# Patient Record
Sex: Male | Born: 1945
Health system: Southern US, Community
[De-identification: ages and names within clinical notes are randomized; demographics above are authoritative.]

## PROBLEM LIST (undated history)

## (undated) DIAGNOSIS — I4891 Unspecified atrial fibrillation: Secondary | ICD-10-CM

## (undated) DIAGNOSIS — I10 Essential (primary) hypertension: Secondary | ICD-10-CM

## (undated) DIAGNOSIS — I499 Cardiac arrhythmia, unspecified: Secondary | ICD-10-CM

## (undated) DIAGNOSIS — I340 Nonrheumatic mitral (valve) insufficiency: Secondary | ICD-10-CM

## (undated) DIAGNOSIS — K219 Gastro-esophageal reflux disease without esophagitis: Secondary | ICD-10-CM

## (undated) DIAGNOSIS — G7000 Myasthenia gravis without (acute) exacerbation: Secondary | ICD-10-CM

## (undated) DIAGNOSIS — E782 Mixed hyperlipidemia: Secondary | ICD-10-CM

## (undated) DIAGNOSIS — Z8679 Personal history of other diseases of the circulatory system: Secondary | ICD-10-CM

## (undated) DIAGNOSIS — Z8669 Personal history of other diseases of the nervous system and sense organs: Secondary | ICD-10-CM

## (undated) DIAGNOSIS — I77810 Thoracic aortic ectasia: Secondary | ICD-10-CM

## (undated) HISTORY — DX: Personal history of other diseases of the nervous system and sense organs: Z86.69

## (undated) HISTORY — DX: Gastro-esophageal reflux disease without esophagitis: K21.9

## (undated) HISTORY — DX: Personal history of other diseases of the circulatory system: Z86.79

## (undated) HISTORY — DX: Nonrheumatic mitral (valve) insufficiency: I34.0

## (undated) HISTORY — DX: Mixed hyperlipidemia: E78.2

## (undated) HISTORY — DX: Thoracic aortic ectasia: I77.810

## (undated) HISTORY — DX: Essential (primary) hypertension: I10

## (undated) HISTORY — DX: Myasthenia gravis without (acute) exacerbation: G70.00

---
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
## 1989-02-12 HISTORY — PX: LAMINECTOMY: SHX219

## 1998-02-18 ENCOUNTER — Ambulatory Visit (HOSPITAL_COMMUNITY): Admission: RE | Admit: 1998-02-18 | Discharge: 1998-02-18 | Payer: Self-pay | Admitting: Gastroenterology

## 1998-03-30 ENCOUNTER — Ambulatory Visit (HOSPITAL_COMMUNITY): Admission: RE | Admit: 1998-03-30 | Discharge: 1998-03-30 | Payer: Self-pay | Admitting: *Deleted

## 1998-03-30 ENCOUNTER — Encounter: Payer: Self-pay | Admitting: *Deleted

## 2000-12-09 ENCOUNTER — Encounter: Payer: Self-pay | Admitting: Emergency Medicine

## 2000-12-09 ENCOUNTER — Inpatient Hospital Stay (HOSPITAL_COMMUNITY): Admission: EM | Admit: 2000-12-09 | Discharge: 2000-12-10 | Payer: Self-pay | Admitting: Emergency Medicine

## 2002-04-05 ENCOUNTER — Encounter: Payer: Self-pay | Admitting: Emergency Medicine

## 2002-04-06 ENCOUNTER — Observation Stay (HOSPITAL_COMMUNITY): Admission: EM | Admit: 2002-04-06 | Discharge: 2002-04-06 | Payer: Self-pay | Admitting: Emergency Medicine

## 2002-04-06 ENCOUNTER — Encounter: Payer: Self-pay | Admitting: Emergency Medicine

## 2002-11-25 ENCOUNTER — Encounter: Payer: Self-pay | Admitting: Family Medicine

## 2002-11-25 ENCOUNTER — Ambulatory Visit (HOSPITAL_COMMUNITY): Admission: RE | Admit: 2002-11-25 | Discharge: 2002-11-25 | Payer: Self-pay | Admitting: Family Medicine

## 2004-04-29 ENCOUNTER — Ambulatory Visit (HOSPITAL_COMMUNITY): Admission: RE | Admit: 2004-04-29 | Discharge: 2004-04-29 | Payer: Self-pay | Admitting: Family Medicine

## 2004-12-05 ENCOUNTER — Encounter: Admission: RE | Admit: 2004-12-05 | Discharge: 2004-12-05 | Payer: Self-pay | Admitting: *Deleted

## 2005-12-10 ENCOUNTER — Encounter: Admission: RE | Admit: 2005-12-10 | Discharge: 2005-12-10 | Payer: Self-pay | Admitting: *Deleted

## 2006-09-19 ENCOUNTER — Ambulatory Visit (HOSPITAL_BASED_OUTPATIENT_CLINIC_OR_DEPARTMENT_OTHER): Admission: RE | Admit: 2006-09-19 | Discharge: 2006-09-19 | Payer: Self-pay | Admitting: Surgery

## 2006-09-19 ENCOUNTER — Encounter (INDEPENDENT_AMBULATORY_CARE_PROVIDER_SITE_OTHER): Payer: Self-pay | Admitting: Surgery

## 2007-07-06 ENCOUNTER — Ambulatory Visit: Payer: Self-pay | Admitting: Internal Medicine

## 2007-07-06 ENCOUNTER — Inpatient Hospital Stay (HOSPITAL_COMMUNITY): Admission: EM | Admit: 2007-07-06 | Discharge: 2007-07-08 | Payer: Self-pay | Admitting: Emergency Medicine

## 2009-04-25 ENCOUNTER — Encounter: Admission: RE | Admit: 2009-04-25 | Discharge: 2009-04-25 | Payer: Self-pay | Admitting: Family Medicine

## 2010-03-04 ENCOUNTER — Encounter: Payer: Self-pay | Admitting: *Deleted

## 2010-06-27 NOTE — Discharge Summary (Signed)
[name], [name]           ACCOUNT NO.:  [id number]   MEDICAL RECORD NO.:  [id number]          PATIENT TYPE:  INP   LOCATION:  1409                         FACILITY:  [facility]   PHYSICIAN:  Ramiro Harvest, MD    DATE OF BIRTH:  Sep 25, 1945   DATE OF ADMISSION:  07/06/2007  DATE OF DISCHARGE:  07/08/2007                               DISCHARGE SUMMARY   PRIMARY CARE PHYSICIAN:  Dr. [name] of [facility].   DISCHARGE DIAGNOSES:  1. Urinary tract infection/early urosepsis.  2. Micturition syncope.  3. Microhematuria secondary to #1.  4. Hypotension.  5. Dehydration.  6. Hypokalemia.  7. Hypertension.  8. Myasthenia gravis.   DISCHARGE MEDICATIONS:  1. Ceftin 250 mg p.o. b.i.d. x12 days.  2. Mycophenolate 1000 mg p.o. b.i.d.  3. Triamterene/hydrochlorothiazide 37.5/25 mg p.o. daily.  4. Pyridostigmine 30 mg p.o. b.i.d.  5. Ramipril 5 mg p.o. daily.  6. Multivitamin daily.  7. Aspirin 81 mg p.o. daily   DISPOSITION:  On followup, patient will be discharged home to follow up  with her primary care physician in 2 weeks.  On followup, a repeat  urinalysis needs to be checked for resolution of UTI.  Patient's urine  sensitivities from this hospitalization needs to be followed up.  A  basic metabolic profile needs to be checked to follow up on the  patient's electrolytes as well.   PROCEDURES PERFORMED:  None.   CONSULTATIONS DONE:  None.   BRIEF ADMISSION HISTORY AND PHYSICAL:  [name] is a 65 year old  male who woke up on the day prior to admission with frequent burning on  urination.  He had to go to the restroom every 10 minutes.  Patient did  not feel well.  He went to the [facility] around lunch time and  was prescribed some Cipro.  He took the first dose at 2 and the second  one at 10:00 p.m.  At around 11:00 p.m., he urinated with large amounts  of blood clots.  He had burning at the end of urination and passed out  in the bathroom.  He went  down on the floor but did not hit his head.  There was no chest pain.  He felt sweaty and clammy.   PHYSICAL EXAM:  Per admitting physician, temperature 98.2, blood  pressure 111/72, respiratory rate 20, satting 94%.  GENERAL:  Patient in no acute distress.  HEENT:  Normocephalic, atraumatic.  Pupils equal, round, and reactive to  light.  Extraocular movements intact.  Moist mucosa membranes.  RESPIRATORY:  Lungs were clear to auscultation bilaterally.  No wheezes.  No rhonchi.  CARDIOVASCULAR:  Regular rate and rhythm.  No murmurs, rubs, or gallops.  ABDOMEN:  Soft, nontender, nondistended.  Positive bowel sounds.  No  rebound.  No guarding.  EXTREMITIES:  No clubbing, cyanosis, or edema.  NEUROLOGICAL:  Patient was alert and oriented x3.  Cranial nerves II-XII  were grossly intact.  No focal deficits.  Muscle strength was within  normal limits.   ADMISSION LABS:  Sodium 135, potassium 3.1, creatinine 0.75, BUN 13.  UA  with 7010 white  blood cells, too numerous to count RBCs, white count  25.2, hemoglobin 15.1, platelets 173, myoglobin 92.4, CK-MB 1.4,  troponin 0.1, INR 1.1.  EKG with normal sinus rhythm.  No acute changes.   HOSPITAL COURSE:  1. UTI/early urosepsis.  Patient was placed on supportive care with IV      fluids.  Patient was placed on IV Rocephin and urine cultures were      reordered.  Cultures came back negative; however, patient had been      on Cipro prior to admission.  [facility] was called to obtain prior      urinalysis and cultures and sensitivities from the urinalysis      obtained at the urgent care.  Sensitivities were pending; however,      urine cultures grew greater than 100,000 E. coli.  Patient remained      stable throughout the hospitalization, remained afebrile.      Patient's white count improved daily and by day of discharge      patient's white count was 6.8.  Patient will be discharged in      stable and improved condition.  Patient was well  hydrated      throughout the hospitalization and his antihypertensives were held      during the hospitalization.  Patient will be discharged home on      Ceftin 250 mg p.o. b.i.d. for 10 days to complete a 14-day course      as patient is being treated as a complicated urinary tract      infection.  Patient will need followup with his PCP and patient      will be discharged in stable and improved condition.  2. Micturition syncope.  Patient had no episodes of syncope throughout      the hospitalization.  It was felt this was secondary to      micturition.  Cardiac enzymes were also obtained which came back      negative.  EKG was within normal limits.  Patient remained      asymptomatic throughout the hospitalization and will be discharged      in stable and improved condition.  3. Hypotension.  Patient's antihypertensives were held throughout the      hospitalization.  Patient was hydrated with IV fluids and by day of      discharge patient's blood pressure was stable at 124/78.  4. Microhematuria secondary to problem #1, was stable.  See treatment      for problem #1.  No more episodes of microhematuria throughout the      hospitalization.  5. Hypokalemia was repleted throughout the hospitalization.  6. Dehydration.  Patient was placed on IV fluids.  Patient was well-      hydrated.  Patient was tolerating p.o.'s and by day of discharge      patient was in stable and improved condition.  Patient's      hypertensive were held throughout the hospitalization.  Patient      will be discharged in stable and improved condition.   On day of discharge, vital signs temperature 99.0, pulse of 64, blood  pressure 124/78, respiratory rate 16, satting 99% on room air.   DISCHARGE LABS:  Sodium 141, potassium 3.8, chloride 109, bicarb 26, BUN  9, creatinine 0.87, glucose of 95, calcium of 8.3.  CBC, white count  6.8, hemoglobin 12.2, platelets 137, hematocrit 34.2.  Urine cultures  from Jul 05, 2007, from [facility] showed greater than 100,000 E.  coli.  It  was a pleasure taking care of [name].      Ramiro Harvest, MD  Electronically Signed     DT/MEDQ  D:  07/08/2007  T:  07/08/2007  Job:  [id number]   cc:   Dario Guardian, M.D.  Fax: [phone]

## 2010-06-27 NOTE — H&P (Signed)
NAMEPRICE, LACHAPELLE           ACCOUNT NO.:  [id number]   MEDICAL RECORD NO.:  [id number]          PATIENT TYPE:  EMS   LOCATION:  ED                           FACILITY:  [facility]   PHYSICIAN:  Georgina Quint. Plotnikov, MDDATE OF BIRTH:  August 09, 1945   DATE OF ADMISSION:  07/06/2007  DATE OF DISCHARGE:                              HISTORY & PHYSICAL   CHIEF COMPLAINT:  Blood in urine.   HISTORY OF PRESENT ILLNESS:  The patient is a 65 year old male who woke  up yesterday with frequent burning urination.  He had to go every 10  minutes. He did not feel well, went to [facility] around lunch  time and was prescribed Cipro.  He took the first dose at 2:00 and the  second at 10:00 p.m.  At around 11:00 he urinated with large amounts of  blood with clots. He had burning at the end of urination and passed out  in the bathroom.  He went down on the floor but did not hit his head.  There was no chest pain.  He felt sweaty and clammy.   PAST MEDICAL HISTORY:  Hypertension, myasthenia gravis.   SOCIAL HISTORY:  He is a Geophysicist/field seismologist. Does not smoke or drink  alcohol.  He is married.   FAMILY HISTORY:  Negative for coronary disease.  Father had some kind of  tachycardia.   ALLERGIES:  DEMEROL.   CURRENT MEDICATIONS:  Triamterene/HCTZ 37.5/25 one daily, mycophenolate  sodium 1000 mg twice a day, pyridostigmine  bromide 60 mg b.i.d.  Ramipril 5 mg daily, Cipro 500 mg twice a day given today.   REVIEW OF SYSTEMS:  No previous syncope.  No chest pain or shortness of  breath.  No arrhythmias. No previous urinary problems.  No kidney  stones. No nausea or vomiting today.  He was able to eat and drink. The  rest of the 18-point review of systems is as above or negative. He has  had some chronic low back pain from arthritis.   PHYSICAL EXAMINATION:  VITAL SIGNS:  Temperature 98.2, blood pressure  111/72, heart rate 20, sats 94%.  He is in no acute distress.  ABDOMEN:  Soft, nontender.  No organomegaly and no mass felt.  HEENT: Moist mucosa.  NECK: Supple.  No thyromegaly or tenderness.  LUNGS:  Clear.  No wheezes or rales.  HEART: S1, S2.  No gallop.  ABDOMEN: Soft, nontender.  No organomegaly. No mass felt.  EXTREMITIES:  Lower extremities without edema.  Calves nontender.  Pulses normal.  NEUROLOGICAL:  He is alert, oriented, cooperative and denies being  depressed.  Cranial Nerves II-XII nonfocal.  Muscle strength with normal  limits.   LABS:  Sodium 135, potassium 3.1, creatinine 0.75, BUN 13.  Urinalysis  with 7-10 WBCs and too numerous to count RBCs.  White count 25,200,  hemoglobin 15.1, platelets 173. Myoglobin 92.4. CK-MB 1.4, troponin 0.1.  INR 1.1.  EKG with normal sinus rhythm, no acute changes.   ASSESSMENT AND PLAN:  1. Urinary tract infection, possible early urosepsis.  We will start      IV Rocephin. Cultures obtained.  2. Macrohematuria due to problem #1.  3. Syncope, likely micturitional.  Admit to telemetry.  Cardiac      markers every 8 hours x3. EKG if needed. Asymptomatic now.  4. Hypotension.  Hold his antihypertensive.  5. Elevated white blood cell count due to problem urinary tract      infection, possible early urosepsis.  6. Hypokalemia.  Will replace p.o.  7. Dehydration.  IV fluids.      Georgina Quint. Plotnikov, MD  Electronically Signed     AVP/MEDQ  D:  07/06/2007  T:  07/06/2007  Job:  [id number]   cc:   Dario Guardian, M.D.  Fax: [phone]

## 2010-06-27 NOTE — Op Note (Signed)
NAMETHEOPHILUS, WALZ           ACCOUNT NO.:  [id number]   MEDICAL RECORD NO.:  [id number]          PATIENT TYPE:  AMB   LOCATION:  DSC                          FACILITY:  [facility]   PHYSICIAN:  Velora Heckler, MD      DATE OF BIRTH:  Sep 06, 1945   DATE OF PROCEDURE:  09/19/2006  DATE OF DISCHARGE:                               OPERATIVE REPORT   PREOPERATIVE DIAGNOSIS:  1. Soft tissue mass left upper extremity, 1.5 cm.  2. Atypical nevus mid back 3 mm.   POSTOPERATIVE DIAGNOSES:  1. Soft tissue mass left upper extremity, 1.5 cm.  2. Atypical nevus mid back 3 mm.   PROCEDURE:  1. Excise 1.5 cm subcutaneous nodule left upper extremity.  2. Shave biopsy, nevus of back (3 mm).   SURGEON:  Velora Heckler, MD, FACS   ANESTHESIA:  Local with epinephrine.   ESTIMATED BLOOD LOSS:  Minimal.   PREPARATION:  Betadine.   COMPLICATIONS:  None.   INDICATIONS:  The patient is a 65 year old white male, [employer] from [place], [place].  The patient has a newly  identified soft tissue mass in the left upper arm as well as an atypical  nevus in the mid back.  He desires excision of both.   PROCEDURE:  The procedure was performed in the minor procedure room at  [facility].  The patient was placed in supine position.  Left upper arm was prepped and draped in the usual strict aseptic  fashion.  Skin was anesthetized with local anesthetic with epinephrine.  An elliptical incision was made so as to encompass the entire soft  tissue mass lesion in the lateral left upper arm.  Dissection was  carried through subcutaneous tissues.  Mass was excised in its entirety  and submitted to pathology for review.  Hemostasis is by direct  pressure.  Skin edges were reapproximated interrupted 3-0 Vicryl  subcuticular sutures.  Wound is washed and dried and Benzoin, Steri-  Strips are applied.  Sterile dressing is applied.   Next, the patient is placed in a seated position.   Skin at the site of  the nevus in the left mid back was prepped with Betadine.  Skin was  anesthetized with local anesthetic with epinephrine.  Using a #15 blade,  a shave biopsy of a 3  mm nevus was performed.  Hemostasis was obtained with silver nitrate.  Neosporin and Band-Aid were placed as dressing.   The patient is discharged home.  He tolerated the procedure well.      Velora Heckler, MD  Electronically Signed     TMG/MEDQ  D:  09/19/2006  T:  09/19/2006  Job:  [id number]   cc:   Dario Guardian, M.D.

## 2010-06-30 NOTE — Cardiovascular Report (Signed)
[place]  Patient:    [name], [name] Visit Number: [id number] MRN: [id number]          Service Type: MED Location: [id number] Attending Physician:  Lyn Records. Iii Dictated by:   Darci Needle, M.D. Proc. Date: 12/10/00 Admit Date:  12/09/2000 Discharge Date: 12/10/2000   CC:         Meredith Staggers, M.D., [facility]  Room #349 @ [facility]   Cardiac Catheterization  INDICATION FOR PROCEDURE: Prolonged chest pain responsive to nitroglycerin and consistent with unstable angina.  PROCEDURES PERFORMED: 1. Left heart catheterization. 2. Selective coronary angiography. 3. Left ventriculography.  DESCRIPTION OF PROCEDURE: After informed consent, a 6 French sheath was inserted into the right femoral artery using a modified Seldinger technique. A French A2 multipurpose catheter was used for hemodynamic recordings, left ventriculography, and selective right and left coronary angiography.  The patient tolerated the procedure without complications.  RESULTS:  I: HEMODYNAMIC DATA:     a. The aortic pressure 126/84 mmHg.     b. Left ventricular pressure 127/12 mmHg.  II: LEFT VENTRICULOGRAPHY: The left ventricle cavity size and demonstrates normal overall contractility.  III: SELECTIVE CORONARY ANGIOGRAPHY:     a. Left main: The left main is short. No significant obstruction is noted.     b. Left anterior descending coronary artery: The LAD is a large        vessel that reaches the left ventricular apex.  It becomes very        small in caliber in the apical course of the vessel. No obstruction        is noted in this vessel. One small diagonal branch arises from this        vessel. Luminal irregularity is noted in the proximal vessel.     c. Circumflex artery: The circumflex artery is large. It gives origin to        three obtuse marginal branches and the PDA. This vessel contains        luminal  irregularities, is quite large and contains no significant        obstruction.  The first obtuse marginal is large and a branching vessel        without any significant abnormalities noted.     d. Right coronary artery: The right coronary artery is small and        nondominant. It contains no significant obstruction.  CONCLUSIONS: 1. Essentially normal coronary arteries for age. There is luminal    irregularity noted in the left anterior descending and in the    circumflex. 2. Normal left ventricular function. 3. Chest pain, etiology is uncertain. With nitroglycerin responsiveness,    normal enzymes, and normal ECGs, the source of the discomfort is almost    certainly not cardiac. Would raise the possibility of gastrointestinal    origin or musculoskeletal. Certainly, should the patient redevelop chest    discomfort, we might also consider CT scanning to rule out aortic    dissection.  PLAN: 1. Discontinue nitroglycerin and heparin. 2. Ambulate. 3. If groin is stable, the patient is eligible for discharge, today    December 10, 2000. Dictated by:   Darci Needle, M.D. Attending Physician:  Lyn Records. Iii DD:  12/10/00 TD:  12/11/00 Job: [id number]

## 2010-06-30 NOTE — H&P (Signed)
NAME:  [name], [name]                  ACCOUNT NO.:  [id number]   MEDICAL RECORD NO.:  [id number]                   PATIENT TYPE:  INP   LOCATION:  1826                                 FACILITY:  [facility]   PHYSICIAN:  Candyce Churn, M.D.          DATE OF BIRTH:  Sep 18, 1945   DATE OF ADMISSION:  04/05/2002  DATE OF DISCHARGE:                                HISTORY & PHYSICAL   CHIEF COMPLAINT:  Chest pain.   HISTORY OF PRESENT ILLNESS:  The patient is a pleasant 65 year old male with  a history of hypertension and obesity.  He presents with chest pain that  started approximately 9 p.m. on April 05, 2002.  It was substernal and  somewhat pleuritic.  It occurred about one and a half to two hours after  eating.  He apparently had some mild chest symptoms -- like a cold -- for  one to two weeks and was given an Advair inhaler at [facility]  approximately 10 days ago.  It was felt that he might have some mild asthma.  The pain tonight in his chest has persisted but did respond partially to  sublingual nitroglycerin x2.  Denies radiation, diaphoresis or nausea or  shortness of breath.  It is a dull ache, not burning.  Initial CPK is  negative.  He is admitted now to rule out MI.   MEDICATIONS:  1. Altace 5 mg a day.  2. Maxzide 37.5/25 mg daily.   PAST MEDICAL HISTORY:  1. Hypertension.  2. Obesity.   PAST SURGICAL HISTORY:  Laminectomy, L5-S1.   FAMILY HISTORY:  Mother died of a Klatskin's tumor in her 75s or 64s.  Father died of old at age 24.  He has a younger brother who is healthy as  far as he knows.   REVIEW OF SYSTEMS:  He had a heart catheterization two years ago by Dr.  Lyn Records; it was normal.  This pain is somewhat different.  He was  told that he likely had GERD with esophageal spasm at that time.  He denies  cough, fever or chills currently.   PHYSICAL EXAMINATION:  GENERAL:  Obese, now in no acute distress, lying  supine.  VITAL  SIGNS:  Blood pressure 121/61 after two nitroglycerin, pulse 84 and  regular, respiratory rate 16 and easy, O2 saturation 94% on room air.  He is  now on 2 L of O2.  HEENT:  He is man balding with glasses.  NECK:  Neck is supple without JVD.  CHEST:  Chest is clear to auscultation.  CARDIAC:  Regular rhythm without murmur, rub or gallop.  ABDOMEN:  Abdomen is soft and nontender with no masses, no pain to  palpitation and no obvious organomegaly.  EXTREMITIES:  Extremities without cyanosis, clubbing or edema.  No pain to  palpation in the calves or thighs.  NEUROLOGICAL:  Oriented x 3, nonfocal.  SKIN:  Skin without rashes.   LABORATORY  AND ACCESSORY CLINICAL DATA:  EKG:  Sinus rhythm at 84 beats per  minute, normal EKG.  There is a question of J point elevation in V2 to V3.   Chest x-ray:  No active disease.   Chest CT significant for a 4-cm pretracheal lymph node, otherwise, benign.   CPK is 320, CK-MB is 1.8, troponin I of 0.02.  Sodium is 137, potassium 2.3,  chloride 103, bicarb 22, BUN 18, creatinine 0.8.  Glucose is 104.  Hemoglobin 15, white count 14,600, 81% polys, platelet count 190,000.   ASSESSMENT:  [age]-year-old male with history of hypertension and  obesity with unclear etiology of chest pain.  No response to  gastrointestinal cocktail but partial response to sublingual nitroglycerin.  He has a 4-cm pretracheal lymph node and mild leukocytosis with 81%  leukocytes; I wonder if this might be a low-grade viral pleurisy or  pneumonitis; I also wonder if Advair may have partially increased his white  count secondary to steroid therapy.   PLAN:  Will admit and monitor overnight.  Treat with subcu Lovenox 1 mg/kg  q.12h.  We will check serial CPKs and place on nasal cannula O2.   We will check EKG at 8 a.m.  If CPKs are negative x3, he could likely be  further worked up as an outpatient and discharged on aspirin and sublingual  nitroglycerin.  We will also give a  PPI and might consider Toradol if pain  persists and is pleuritic and without elevation in his CPK after three q.8h.  CPK checks are not indicative of myocardial ischemia or injury.                                               Candyce Churn, M.D.    RNG/MEDQ  D:  04/06/2002  T:  04/06/2002  Job:  [id number]   cc:   Lazaro Arms, M.D.  [address] [place], [place] [address]  Fax: [phone]   Lyn Records III, M.D.  [address]  [place]  [place] [address]  Fax: [phone]   Dario Guardian, M.D.  [address]  [place]  [place] [address]  Fax: [phone]

## 2010-06-30 NOTE — H&P (Signed)
[place]. [facility]  Patient:    [name], [name] Visit Number: [id number] MRN: [id number]          Service Type: OUT Location: CATH Attending Physician:  Lyn Records. Iii Dictated by:   Francisca December, M.D. Admit Date:  12/10/2000   CC:         Dario Guardian, M.D.             Celso Sickle, M.D.                         History and Physical  CHIEF COMPLAINT:  Anterior chest pain.  HISTORY OF PRESENT ILLNESS:  [name] is a 65 year old male with the spontaneous onset of anterior substernal chest pressure without radiation at around 9:30 a.m. this morning, which lasted until he arrived in the emergency room and received sublingual and IV nitroglycerin at around 11 to 11:30 a.m.  There were no associated symptoms.  He had a similar discomfort in 1995, and was eventually ruled out for a myocardial infarction.  He was subsequently diagnosed with pericarditis by Dr. Darci Needle III. Subsequent GXT was negative.  Of note, approximately seven to 10 days ago, he had anterior substernal chest tightness while changing a tire.  He was also short of breath this morning, much more so than usual when taking out the trash.  CARDIAC RISK FACTORS:  Include age, sex, and hypertension.  He denies any significant GERD symptoms.  PAST MEDICAL HISTORY 1. Hypertension. 2. Questionable hiatal hernia. 3. Status post lumbar laminectomy.  SOCIAL HISTORY:  He is a Geophysicist/field seismologist and a retired Nurse, mental health. He is married for a second time, divorced from his first wife, and living in a single family home in [place].  HABITS:  Tobacco:  None.  Ethanol:  None.  Caffeine:  Not excessive. Recreational drugs:  None.  CURRENT MEDICATIONS: 1. Altace 10 mg p.o. q.d. 2. Diuretic, unknown type.  ALLERGIES:  DEMEROL causes GI distress.  FAMILY HISTORY:  Not significant for early coronary artery disease.  REVIEW OF SYSTEMS:  He  has had no recent weight loss or gain.  No fever, chills, or malaise.  Denies any frequent headache.  No visual changes.  Does wear glasses.  No hearing problems.  No difficulty with his teeth or gums.  No difficulty swallowing.  No cough or hemoptysis.  No wheezing.  Denies any orthopnea, PND, or lower extremity edema.  No tachypalpitations or syncope. No abdominal pain.  No change in bowel habits.  No hematochezia or melena.  No hematemesis.  He has no dysuria, hematuria, or nocturia.  No muscle weakness, dysesthesia, or hypesthesia.  Never had a seizure or a stroke.  He does have intermittent chronic low back pain.  PHYSICAL EXAMINATION:  VITAL SIGNS:  Blood pressure 150/96, pulse 72, respirations 18.  He is afebrile.  GENERAL:  He is a 65 year old white male in no distress.  HEENT:  Significant for absence of scleral icterus.  Tongue is not coated. Head is normocephalic, atraumatic.  Pupils equal, round, reactive to light and accommodation.  Extraocular movements intact.  Teeth and gums in good repair. Oral mucosa is pink and moist.  NECK:  Supple without thyromegaly or masses.  Carotid upstrokes are normal. No bruit.  No jugular venous distention.  No thyromegaly.  CHEST/CARDIAC:  Clear with good excursions bilaterally.  Normal vesicular breath sounds are heard throughout.  The precordium is quiet.  Normal S1, S2. No S3, S4, murmur, click, or rub noted.  The chest wall is nontender.  ABDOMEN:  Flat, soft, nontender.  Without hepatosplenomegaly or midline pulsatile masses.  GENITOURINARY:  Testes are descended bilaterally.  Normal male phallus without lesions.  RECTAL:  Not performed.  EXTREMITIES:  Full range of motion.  No edema. Intact distal pulses.  NEUROLOGIC:  Cranial nerves II-XII intact.  Motor and sensory grossly intact. Gait is not tested.  SKIN:  Warm, dry, and clear.  ACCESSORY CLINICAL DATA:  Admission hemogram and serum electrolytes, BUN, creatinine,  and glucose within normal limits.  Initial CPK and troponin are negative.  ECG is normal with the exception of an incomplete right bundle branch block.  IMPRESSION:  Chest pain, ? _________ ____________ ____________, ? recurrent pericarditis, ? gastroesophageal reflux disease.  PLAN: Admit with chest pain for observation.  Obtain serial enzymes.  Continue IV nitroglycerin.  Administer aspirin.  Repeat electrocardiogram in 12-24 hours.  Further evaluation per Dr. Verdis Prime and the results of the above tests. Dictated by:   Francisca December, M.D. Attending Physician:  Lyn Records. Iii DD:  12/09/00 TD:  12/10/00 Job: [id number]

## 2010-11-08 LAB — DIFFERENTIAL
Basophils Absolute: 0
Basophils Absolute: 0
Basophils Relative: 0
Basophils Relative: 1
Eosinophils Relative: 1
Eosinophils Relative: 1
Lymphocytes Relative: 3 — ABNORMAL LOW
Lymphs Abs: 0.7
Lymphs Abs: 0.8
Monocytes Absolute: 0.5
Monocytes Relative: 6
Monocytes Relative: 7
Neutro Abs: 5.4
Neutrophils Relative %: 80 — ABNORMAL HIGH
Neutrophils Relative %: 91 — ABNORMAL HIGH

## 2010-11-08 LAB — BASIC METABOLIC PANEL
BUN: 9
BUN: 9
CO2: 26
Chloride: 109
Chloride: 109
Creatinine, Ser: 0.73
Creatinine, Ser: 0.87
GFR calc Af Amer: >60
GFR calc non Af Amer: >60
Potassium: 3.6
Sodium: 141

## 2010-11-08 LAB — CBC
HCT: 40.7
HCT: 44.1
Hemoglobin: 13.5
Hemoglobin: 15.1
MCHC: 34.2
MCHC: 35.8
MCV: 91.2
MCV: 91.3
MCV: 92
MCV: 92.6
Platelets: 148 — ABNORMAL LOW
RBC: 3.75 — ABNORMAL LOW
RDW: 12.3
RDW: 12.8
RDW: 13
RDW: 13.3
WBC: 15.4 — ABNORMAL HIGH
WBC: 6.8

## 2010-11-08 LAB — CARDIAC PANEL(CRET KIN+CKTOT+MB+TROPI)
Relative Index: 2.7 — ABNORMAL HIGH
Total CK: 122
Troponin I: 0.03

## 2010-11-08 LAB — COMPREHENSIVE METABOLIC PANEL
AST: 16
Albumin: 3.6
Alkaline Phosphatase: 69
Alkaline Phosphatase: 73
CO2: 27
Creatinine, Ser: 0.76
GFR calc Af Amer: >60
GFR calc non Af Amer: >60
GFR calc non Af Amer: >60
Glucose, Bld: 137 — ABNORMAL HIGH
Potassium: 3.1 — ABNORMAL LOW
Potassium: 3.1 — ABNORMAL LOW
Total Bilirubin: 1.1
Total Bilirubin: 1.4 — ABNORMAL HIGH
Total Protein: 5.9 — ABNORMAL LOW

## 2010-11-08 LAB — TROPONIN I: Troponin I: 0.02

## 2010-11-08 LAB — URINE CULTURE
Colony Count: NO GROWTH
Culture: NO GROWTH

## 2010-11-08 LAB — URINALYSIS, ROUTINE W REFLEX MICROSCOPIC: Protein, ur: NEGATIVE

## 2010-11-08 LAB — POCT CARDIAC MARKERS
CKMB, poc: 1.4
Myoglobin, poc: 92.4
Operator id: 294591
Troponin i, poc: 0.1 — ABNORMAL HIGH

## 2010-11-08 LAB — MAGNESIUM: Magnesium: 2.1

## 2010-11-08 LAB — PROTIME-INR: INR: 1.1

## 2010-11-08 LAB — CK TOTAL AND CKMB (NOT AT ARMC)
Relative Index: 3 — ABNORMAL HIGH
Total CK: 107

## 2012-10-08 ENCOUNTER — Other Ambulatory Visit: Payer: Self-pay | Admitting: Family Medicine

## 2013-06-26 ENCOUNTER — Other Ambulatory Visit: Payer: Self-pay | Admitting: Family Medicine

## 2013-06-26 DIAGNOSIS — R935 Abnormal findings on diagnostic imaging of other abdominal regions, including retroperitoneum: Secondary | ICD-10-CM

## 2013-11-23 ENCOUNTER — Ambulatory Visit
Admission: RE | Admit: 2013-11-23 | Discharge: 2013-11-23 | Disposition: A | Discharge status: Home / Self Care | Payer: Medicare PPO | Source: Ambulatory Visit | Attending: Family Medicine | Admitting: Family Medicine

## 2013-11-23 DIAGNOSIS — R935 Abnormal findings on diagnostic imaging of other abdominal regions, including retroperitoneum: Secondary | ICD-10-CM

## 2014-01-26 ENCOUNTER — Other Ambulatory Visit: Payer: Self-pay | Admitting: Family Medicine

## 2014-01-26 DIAGNOSIS — Z139 Encounter for screening, unspecified: Secondary | ICD-10-CM

## 2014-02-08 ENCOUNTER — Ambulatory Visit
Admission: RE | Admit: 2014-02-08 | Discharge: 2014-02-08 | Disposition: A | Discharge status: Home / Self Care | Payer: Medicare PPO | Source: Ambulatory Visit | Attending: Family Medicine | Admitting: Family Medicine

## 2014-02-08 DIAGNOSIS — Z139 Encounter for screening, unspecified: Secondary | ICD-10-CM

## 2014-07-26 DIAGNOSIS — I10 Essential (primary) hypertension: Secondary | ICD-10-CM | POA: Diagnosis not present

## 2014-08-09 ENCOUNTER — Other Ambulatory Visit: Payer: Self-pay

## 2014-09-09 DIAGNOSIS — L723 Sebaceous cyst: Secondary | ICD-10-CM | POA: Diagnosis not present

## 2014-11-17 ENCOUNTER — Other Ambulatory Visit: Payer: Self-pay | Admitting: Family Medicine

## 2014-11-17 DIAGNOSIS — R222 Localized swelling, mass and lump, trunk: Secondary | ICD-10-CM

## 2014-11-22 ENCOUNTER — Ambulatory Visit
Admission: RE | Admit: 2014-11-22 | Discharge: 2014-11-22 | Disposition: A | Discharge status: Home / Self Care | Payer: Medicare PPO | Source: Ambulatory Visit | Attending: Family Medicine | Admitting: Family Medicine

## 2014-11-22 DIAGNOSIS — R911 Solitary pulmonary nodule: Secondary | ICD-10-CM | POA: Diagnosis not present

## 2014-11-22 DIAGNOSIS — R222 Localized swelling, mass and lump, trunk: Secondary | ICD-10-CM

## 2014-11-30 ENCOUNTER — Telehealth: Payer: Self-pay | Admitting: Internal Medicine

## 2014-11-30 NOTE — Telephone Encounter (Signed)
[facility] Dr Rennis GoldenHilty on 12/10/14.  [facility] schedule on 12/10/14. lp

## 2014-12-10 ENCOUNTER — Ambulatory Visit (INDEPENDENT_AMBULATORY_CARE_PROVIDER_SITE_OTHER): Payer: Medicare PPO | Admitting: Internal Medicine

## 2014-12-10 ENCOUNTER — Encounter: Payer: Self-pay | Admitting: Internal Medicine

## 2014-12-10 VITALS — BP 144/88 | HR 79 | Ht 67.0 in | Wt 226.4 lb

## 2014-12-10 DIAGNOSIS — I712 Thoracic aortic aneurysm, without rupture: Secondary | ICD-10-CM

## 2014-12-10 DIAGNOSIS — Z8679 Personal history of other diseases of the circulatory system: Secondary | ICD-10-CM

## 2014-12-10 DIAGNOSIS — Z720 Tobacco use: Secondary | ICD-10-CM | POA: Diagnosis not present

## 2014-12-10 DIAGNOSIS — I10 Essential (primary) hypertension: Secondary | ICD-10-CM

## 2014-12-10 DIAGNOSIS — Z8669 Personal history of other diseases of the nervous system and sense organs: Secondary | ICD-10-CM

## 2014-12-10 DIAGNOSIS — R931 Abnormal findings on diagnostic imaging of heart and coronary circulation: Secondary | ICD-10-CM

## 2014-12-10 DIAGNOSIS — I7121 Aneurysm of the ascending aorta, without rupture: Secondary | ICD-10-CM

## 2014-12-10 DIAGNOSIS — F17200 Nicotine dependence, unspecified, uncomplicated: Secondary | ICD-10-CM

## 2014-12-10 DIAGNOSIS — E785 Hyperlipidemia, unspecified: Secondary | ICD-10-CM | POA: Diagnosis not present

## 2014-12-10 DIAGNOSIS — I2510 Atherosclerotic heart disease of native coronary artery without angina pectoris: Secondary | ICD-10-CM | POA: Diagnosis not present

## 2014-12-10 DIAGNOSIS — I77810 Thoracic aortic ectasia: Secondary | ICD-10-CM | POA: Insufficient documentation

## 2014-12-10 NOTE — Progress Notes (Signed)
OFFICE NOTE  Chief Complaint:  Multivessel coronary artery calcium on CT scan  Primary Care Physician: David Found, MD  HPI:  [name] is a pleasant 69 year old male who is pre-was a followed by Dr. Katrinka [name] dating back to 2005. One point he was admitted with chest pain and thought to have pericarditis. He had some recurrent symptoms after therapy and ultimately underwent cardiac catheterization. This showed "essentially normal coronaries" with mild luminal irregularities in the LAD. Since that time he's done fairly well without any recurrent chest pain symptoms. Recently he underwent a CT scan by his urologist which showed an adrenal mass as well as a lung nodule. Subsequent follow-up of that lung nodule with a chest CT demonstrated mild ascending aortic aneurysm measuring 4.2 cm. Apparently this is stable compared to a study in 2007. In addition he was commented on that he had multivessel coronary artery calcium. As mentioned he denies any chest pain but does get some mild shortness of breath with exertion and has not had any worsening fatigue or lack of energy. He had lipid testing back in December 2015 which showed total cluster 195, triglycerides 74, HL 51 and LDL 129.  PMHx:  Past Medical History  Diagnosis Date  . Hypertension     No past surgical history on file.  FAMHx:  No family history on file.  SOCHx:   reports that he quit smoking about 50 years ago. He does not have any smokeless tobacco history on file. His alcohol and drug histories are not on file.  ALLERGIES:  Allergies  Allergen Reactions  . Demerol [Meperidine]     hallucinations    ROS: A comprehensive review of systems was negative.  HOME MEDS: Current Outpatient Prescriptions  Medication Sig Dispense Refill  . ramipril (ALTACE) 5 MG capsule Take 5 mg by mouth daily.    . tamsulosin (FLOMAX) 0.4 MG CAPS capsule Take 0.4 mg by mouth daily.    Marland Kitchen tolterodine (DETROL LA) 4 MG 24 hr  capsule Take 4 mg by mouth daily.    Marland Kitchen triamterene-hydrochlorothiazide (MAXZIDE-25) 37.5-25 MG tablet Take 75 tablets by mouth daily.     No current facility-administered medications for this visit.    LABS/IMAGING: No results [name] for this or any previous visit (from the past 48 hour(s)). No results [name].  WEIGHTS: Wt Readings from Last 3 Encounters:  12/10/14 226 lb 6.4 oz (102.694 kg)    VITALS: BP 144/88 mmHg  Pulse 79  Ht  (1.702 m)  Wt 226 lb 6.4 oz (102.694 kg)  BMI 35.45 kg/m2  EXAM: General appearance: alert and no distress Neck: no carotid bruit, no JVD and thyroid not enlarged, symmetric, no tenderness/mass/nodules Lungs: clear to auscultation bilaterally Heart: regular rate and rhythm, S1, S2 normal, no murmur, click, rub or gallop Abdomen: soft, non-tender; bowel sounds normal; no masses,  no organomegaly Extremities: extremities normal, atraumatic, no cyanosis or edema Pulses: 2+ and symmetric Skin: Skin color, texture, turgor normal. No rashes or lesions Neurologic: Grossly normal Psych: Pleasant  EKG: Normal sinus rhythm at 79, incomplete right bundle branch pattern  ASSESSMENT: 1. Multivessel coronary artery calcium 2. Remote history of pericarditis 3. No significant obstructive coronary disease by heart catheterization 2005 4. Essential hypertension 5. History of myasthenia gravis  PLAN: 1.   [name] has multivessel coronary artery calcium has not had assessment of his coronary arteries and 11 years. Although he is free of chest pain, he does get some shortness of breath  with exertion. He was recently also [name] to have a mildly dilated aortic aneurysm, however it was commented that this is been stable since 2007. I do think he benefit from exercise perfusion stress testing. This will help us to gauge whether or not the coronary artery disease is obstructive. If this is negative then he needs aggressive medical therapy, especially treatment  of his cholesterol. I would suggest that a target LDL be reduced by at least 50% or goal less than 70. This will likely require statin therapy. Plan to discuss this and the results of the stress test on follow-up.  Thanks again for the kind referral.  David NoseKenneth C. Hilty, MD, [facility] Attending Cardiologist [facility]  David NoseKenneth C [name] 12/10/2014, 5:17 PM

## 2014-12-10 NOTE — Patient Instructions (Signed)
Your physician has requested that you have en exercise stress myoview. For further information please visit [email or web address]. Please follow instruction sheet, as given.  Your physician recommends that you return for lab work FASTING  Your physician recommends that you schedule a follow-up appointment with Dr. Rennis GoldenHilty after your tests

## 2014-12-13 ENCOUNTER — Encounter: Payer: Self-pay | Admitting: *Deleted

## 2014-12-14 DIAGNOSIS — E785 Hyperlipidemia, unspecified: Secondary | ICD-10-CM | POA: Diagnosis not present

## 2014-12-17 LAB — CARDIO IQ(R) ADVANCED LIPID PANEL
Apolipoprotein B: 112 mg/dL — ABNORMAL HIGH (ref 52–109)
CHOLESTEROL, TOTAL (CARDIO IQ ADV LIPID PANEL): 218 mg/dL — AB (ref 125–200)
CHOLESTEROL/HDL RATIO (CARDIO IQ ADV LIPID PANEL): 4.2 calc (ref <=5.0)
HDL CHOLESTEROL (CARDIO IQ ADV LIPID PANEL): 52 mg/dL (ref >=40)
LDL CHOLESTEROL CALCULATED (CARDIO IQ ADV LIPID PANEL): 151 mg/dL — AB
LDL Large: 6252 nmol/L (ref 4334–10815)
LDL Medium: 435 nmol/L (ref 167–465)
LDL PEAK SIZE: 221.7 Angstrom (ref >=218.2)
LDL Particle Number: 1707 nmol/L (ref 1016–2185)
LDL SMALL: 174 nmol/L (ref 123–441)
Lipoprotein (a): <10 nmol/L (ref <75)
NON-HDL CHOLESTEROL (CARDIO IQ ADV LIPID PANEL): 166 mg/dL
TRIGLYCERIDES (CARDIO IQ ADV LIPID PANEL): 77 mg/dL

## 2014-12-21 ENCOUNTER — Telehealth (HOSPITAL_COMMUNITY): Payer: Self-pay

## 2014-12-21 NOTE — Telephone Encounter (Signed)
Encounter complete. 

## 2014-12-22 ENCOUNTER — Encounter (HOSPITAL_COMMUNITY): Payer: Medicare PPO

## 2014-12-23 ENCOUNTER — Ambulatory Visit (HOSPITAL_COMMUNITY)
Admission: RE | Admit: 2014-12-23 | Discharge: 2014-12-23 | Disposition: A | Discharge status: Home / Self Care | Payer: Medicare PPO | Source: Ambulatory Visit | Attending: Cardiology | Admitting: Cardiology

## 2014-12-23 DIAGNOSIS — E785 Hyperlipidemia, unspecified: Secondary | ICD-10-CM | POA: Diagnosis not present

## 2014-12-23 DIAGNOSIS — R931 Abnormal findings on diagnostic imaging of heart and coronary circulation: Secondary | ICD-10-CM

## 2014-12-23 DIAGNOSIS — R0609 Other forms of dyspnea: Secondary | ICD-10-CM | POA: Insufficient documentation

## 2014-12-23 DIAGNOSIS — I2510 Atherosclerotic heart disease of native coronary artery without angina pectoris: Secondary | ICD-10-CM | POA: Diagnosis not present

## 2014-12-23 DIAGNOSIS — F17200 Nicotine dependence, unspecified, uncomplicated: Secondary | ICD-10-CM | POA: Insufficient documentation

## 2014-12-23 DIAGNOSIS — R5383 Other fatigue: Secondary | ICD-10-CM | POA: Insufficient documentation

## 2014-12-23 DIAGNOSIS — Z720 Tobacco use: Secondary | ICD-10-CM | POA: Diagnosis not present

## 2014-12-23 DIAGNOSIS — I10 Essential (primary) hypertension: Secondary | ICD-10-CM | POA: Diagnosis not present

## 2014-12-23 MED ORDER — TECHNETIUM TC 99M SESTAMIBI GENERIC - CARDIOLITE
10.4000 | Freq: Once | INTRAVENOUS | Status: AC | PRN
  Administered 2014-12-23: 10 via INTRAVENOUS

## 2014-12-23 MED ORDER — TECHNETIUM TC 99M SESTAMIBI GENERIC - CARDIOLITE
30.2000 | Freq: Once | INTRAVENOUS | Status: AC | PRN
  Administered 2014-12-23: 30.2 via INTRAVENOUS

## 2014-12-24 LAB — MYOCARDIAL PERFUSION IMAGING
CHL CUP NUCLEAR SDS: 3
CHL CUP RESTING HR STRESS: 65 {beats}/min
CHL RATE OF PERCEIVED EXERTION: 17
CSEPEDS: 41 s
CSEPEW: 9.5 METS
CSEPHR: 94 %
Exercise duration (min): 7 min
LV dias vol: 95 mL
LV sys vol: 41 mL
MPHR: 152 {beats}/min
Peak HR: 142 {beats}/min
SRS: 1
SSS: 4
TID: 1.02

## 2015-01-03 ENCOUNTER — Ambulatory Visit (INDEPENDENT_AMBULATORY_CARE_PROVIDER_SITE_OTHER): Payer: Medicare PPO | Admitting: Internal Medicine

## 2015-01-03 ENCOUNTER — Encounter: Payer: Self-pay | Admitting: Internal Medicine

## 2015-01-03 VITALS — BP 144/86 | HR 80 | Ht 67.0 in | Wt 226.9 lb

## 2015-01-03 DIAGNOSIS — E785 Hyperlipidemia, unspecified: Secondary | ICD-10-CM

## 2015-01-03 DIAGNOSIS — I10 Essential (primary) hypertension: Secondary | ICD-10-CM | POA: Diagnosis not present

## 2015-01-03 DIAGNOSIS — I712 Thoracic aortic aneurysm, without rupture: Secondary | ICD-10-CM | POA: Diagnosis not present

## 2015-01-03 DIAGNOSIS — Z79899 Other long term (current) drug therapy: Secondary | ICD-10-CM

## 2015-01-03 DIAGNOSIS — I7121 Aneurysm of the ascending aorta, without rupture: Secondary | ICD-10-CM

## 2015-01-03 DIAGNOSIS — I2510 Atherosclerotic heart disease of native coronary artery without angina pectoris: Secondary | ICD-10-CM

## 2015-01-03 DIAGNOSIS — R931 Abnormal findings on diagnostic imaging of heart and coronary circulation: Secondary | ICD-10-CM

## 2015-01-03 MED ORDER — ATORVASTATIN CALCIUM 40 MG PO TABS: 40.0000 mg | ORAL_TABLET | Freq: Every day | ORAL | Status: DC

## 2015-01-03 NOTE — Patient Instructions (Addendum)
Your physician has recommended you make the following change in your medication - START atorvastatin 40mg  daily  Your physician recommends that you return for lab work NEXT week - 11/28 - 12/2 (CMET, CK)  Your physician recommends that you return for lab work in 3 months - fasting (lipid)  Your physician wants you to follow-up in: 6 months with Dr. Rennis GoldenHilty. You will receive a reminder letter in the mail two months in advance. If you don't receive a letter, please call our office to schedule the follow-up appointment.

## 2015-01-03 NOTE — Progress Notes (Signed)
OFFICE NOTE  Chief Complaint:  Follow-up stress test  Primary Care Physician: Allean Found, MD  HPI:  [name] is a pleasant 69 year old male who is pre-was a followed by Dr. Katrinka Blazing dating back to 2005. One point he was admitted with chest pain and thought to have pericarditis. He had some recurrent symptoms after therapy and ultimately underwent cardiac catheterization. This showed "essentially normal coronaries" with mild luminal irregularities in the LAD. Since that time he's done fairly well without any recurrent chest pain symptoms. Recently he underwent a CT scan by his urologist which showed an adrenal mass as well as a lung nodule. Subsequent follow-up of that lung nodule with a chest CT demonstrated mild ascending aortic aneurysm measuring 4.2 cm. Apparently this is stable compared to a study in 2007. In addition he was commented on that he had multivessel coronary artery calcium. As mentioned he denies any chest pain but does get some mild shortness of breath with exertion and has not had any worsening fatigue or lack of energy. He had lipid testing back in December 2015 which showed total cluster 195, triglycerides 74, HL 51 and LDL 129.  [name] returns for follow-up of his stress test and lab work. His nuclear stress test was negative for ischemia. He did exercise and was asymptomatic with good exercise tolerance and normal recovery. Blood pressure appears well controlled. Cholesterol however is elevated with particle number over 1700 and LDL of 151. Based on his known coronary artery disease, I am recommending a 50% reduction in cholesterol.   PMHx:  Past Medical History  Diagnosis Date  . Hypertension   . Myasthenia gravis (HCC)     currently in remission (11/2014)   . Mixed hyperlipidemia   . History of pericarditis   . History of Bell's palsy   . GERD (gastroesophageal reflux disease)     Past Surgical History  Procedure Laterality Date  .  Tonsillectomy  1950  . Laminectomy  1991    FAMHx:  No family history on file.  SOCHx:   reports that he quit smoking about 48 years ago. His smoking use included Pipe. He does not have any smokeless tobacco history on file. He reports that he drinks about 0.6 oz of alcohol per week. His drug history is not on file.  ALLERGIES:  Allergies  Allergen Reactions  . Demerol [Meperidine]     hallucinations    ROS: A comprehensive review of systems was negative.  HOME MEDS: Current Outpatient Prescriptions  Medication Sig Dispense Refill  . aspirin 81 MG tablet Take 81 mg by mouth [name].    . ramipril (ALTACE) 5 MG capsule Take 5 mg by mouth [name].    . tamsulosin (FLOMAX) 0.4 MG CAPS capsule Take 0.4 mg by mouth [name].    Marland Kitchen tolterodine (DETROL LA) 4 MG 24 hr capsule Take 4 mg by mouth [name].    Marland Kitchen triamterene-hydrochlorothiazide (MAXZIDE-25) 37.5-25 MG tablet Take 75 tablets by mouth [name].    Marland Kitchen atorvastatin (LIPITOR) 40 MG tablet Take 1 tablet (40 mg total) by mouth [name]. 30 tablet 6   No current facility-administered medications for this visit.    LABS/IMAGING: No results found for this or any previous visit (from the past 48 hour(s)). No results found.  WEIGHTS: Wt Readings from Last 3 Encounters:  01/03/15 226 lb 14.4 oz (102.921 kg)  12/23/14 226 lb (102.513 kg)  12/10/14 226 lb 6.4 oz (102.694 kg)    VITALS: BP 144/86  mmHg  Pulse 80  Ht 5\' 7"  (1.702 m)  Wt 226 lb 14.4 oz (102.921 kg)  BMI 35.53 kg/m2  EXAM: General appearance: alert and no distress Neck: no carotid bruit, no JVD and thyroid not enlarged, symmetric, no tenderness/mass/nodules Lungs: clear to auscultation bilaterally Heart: regular rate and rhythm, S1, S2 normal, no murmur, click, rub or gallop Abdomen: soft, non-tender; bowel sounds normal; no masses,  no organomegaly Extremities: extremities normal, atraumatic, no cyanosis or edema Pulses: 2+ and symmetric Skin: Skin color, texture, turgor  normal. No rashes or lesions Neurologic: Grossly normal Psych: Pleasant  EKG: Normal sinus rhythm at 79, incomplete right bundle branch pattern  ASSESSMENT: 1. Multivessel coronary artery calcium 2. Remote history of pericarditis 3. No significant obstructive coronary disease by heart catheterization 2005 4. Essential hypertension 5. History of myasthenia gravis  PLAN: 1.   [name] fortunately did not have any obstructive coronary disease but does have coronary artery disease. I'm recommending starting 40 mg of atorvastatin nightly, which should provide at least a 50% reduction in cholesterol. We'll plan to start that in check a comprehensive metabolic profile and CK in about a week. We'll go ahead and recheck cholesterol in about 3 months and if he continues to do well with that I'll plan to see him back in the office in 6 months.  David NoseKenneth C. Dede Dobesh, MD, [facility] Attending Cardiologist [facility]  [name] 01/03/2015, 1:08 PM

## 2015-01-13 DIAGNOSIS — E785 Hyperlipidemia, unspecified: Secondary | ICD-10-CM | POA: Diagnosis not present

## 2015-01-13 DIAGNOSIS — Z79899 Other long term (current) drug therapy: Secondary | ICD-10-CM | POA: Diagnosis not present

## 2015-01-14 LAB — CK: CK TOTAL: 100 U/L (ref 7–232)

## 2015-01-14 LAB — COMPREHENSIVE METABOLIC PANEL
ALT: 22 U/L (ref 9–46)
AST: 22 U/L (ref 10–35)
Albumin: 3.8 g/dL (ref 3.6–5.1)
Alkaline Phosphatase: 75 U/L (ref 40–115)
BUN: 17 mg/dL (ref 7–25)
CO2: 30 mmol/L (ref 20–31)
CREATININE: 0.65 mg/dL — AB (ref 0.70–1.18)
Calcium: 9.1 mg/dL (ref 8.6–10.3)
Chloride: 103 mmol/L (ref 98–110)
Glucose, Bld: 67 mg/dL (ref 65–99)
POTASSIUM: 4.1 mmol/L (ref 3.5–5.3)
SODIUM: 140 mmol/L (ref 135–146)
Total Bilirubin: 0.6 mg/dL (ref 0.2–1.2)
Total Protein: 6.2 g/dL (ref 6.1–8.1)

## 2015-03-31 LAB — LIPID PANEL
CHOLESTEROL: 127 mg/dL (ref 125–200)
HDL: 53 mg/dL (ref >=40)
LDL Cholesterol: 64 mg/dL (ref <130)
TRIGLYCERIDES: 51 mg/dL (ref <150)
Total CHOL/HDL Ratio: 2.4 Ratio (ref <=5.0)
VLDL: 10 mg/dL (ref <30)

## 2015-06-10 ENCOUNTER — Telehealth: Payer: Self-pay | Admitting: Internal Medicine

## 2015-06-10 NOTE — Telephone Encounter (Signed)
[facility] 06/28/15 with Dr Rennis GoldenHilty.  [facility] 06/28/15. lp

## 2015-06-28 ENCOUNTER — Ambulatory Visit (INDEPENDENT_AMBULATORY_CARE_PROVIDER_SITE_OTHER): Payer: Medicare Other | Admitting: Internal Medicine

## 2015-06-28 ENCOUNTER — Encounter: Payer: Self-pay | Admitting: Internal Medicine

## 2015-06-28 VITALS — BP 120/82 | HR 75 | Ht 67.0 in | Wt 214.0 lb

## 2015-06-28 DIAGNOSIS — I2510 Atherosclerotic heart disease of native coronary artery without angina pectoris: Secondary | ICD-10-CM | POA: Diagnosis not present

## 2015-06-28 DIAGNOSIS — I10 Essential (primary) hypertension: Secondary | ICD-10-CM | POA: Diagnosis not present

## 2015-06-28 DIAGNOSIS — Z8679 Personal history of other diseases of the circulatory system: Secondary | ICD-10-CM | POA: Diagnosis not present

## 2015-06-28 DIAGNOSIS — I7121 Aneurysm of the ascending aorta, without rupture: Secondary | ICD-10-CM

## 2015-06-28 DIAGNOSIS — I712 Thoracic aortic aneurysm, without rupture: Secondary | ICD-10-CM | POA: Diagnosis not present

## 2015-06-28 DIAGNOSIS — R931 Abnormal findings on diagnostic imaging of heart and coronary circulation: Secondary | ICD-10-CM

## 2015-06-28 NOTE — Patient Instructions (Signed)
Your physician recommends that you schedule a follow-up appointment as needed.   Hold your cholesterol medication for 2 weeks - please call our office to let us know how you are doing off this medication.

## 2015-06-28 NOTE — Progress Notes (Signed)
OFFICE NOTE  Chief Complaint:  Follow-up labs  Primary Care Physician: Allean FoundSMITH,CANDACE THIELE, MD  HPI:  [name] is a pleasant 70 year old male who is pre-was a followed by Dr. Katrinka BlazingSmith dating back to 2005. One point he was admitted with chest pain and thought to have pericarditis. He had some recurrent symptoms after therapy and ultimately underwent cardiac catheterization. This showed "essentially normal coronaries" with mild luminal irregularities in the LAD. Since that time he's done fairly well without any recurrent chest pain symptoms. Recently he underwent a CT scan by his urologist which showed an adrenal mass as well as a lung nodule. Subsequent follow-up of that lung nodule with a chest CT demonstrated mild ascending aortic aneurysm measuring 4.2 cm. Apparently this is stable compared to a study in 2007. In addition he was commented on that he had multivessel coronary artery calcium. As mentioned he denies any chest pain but does get some mild shortness of breath with exertion and has not had any worsening fatigue or lack of energy. He had lipid testing back in December 2015 which showed total cluster 195, triglycerides 74, HL 51 and LDL 129.  [name] returns for follow-up of his stress test and lab work. His nuclear stress test was negative for ischemia. He did exercise and was asymptomatic with good exercise tolerance and normal recovery. Blood pressure appears well controlled. Cholesterol however is elevated with particle number over 1700 and LDL of 151. Based on his known coronary artery disease, I am recommending a 50% reduction in cholesterol.   06/28/2015  [name] was seen back in the office today for follow-up. His report he said a marked improvement in cholesterol. Based on his coronary artery calcification, I recommended aggressive 50% reduction in cholesterol which is predicted on 40 mg of atorvastatin. Repeat testing shows that his LDL cholesterol in fact  has gone down to 64 and total cholesterol down to 127. Apparently his primary care provider repeated his CardioIQ panel which demonstrated a particle number just over 1000 with LDL in the low 80s, which I reviewed in the office today with the patient. He reported an initial concern that his cholesterol may be "dropping too rapidly and may be overshot before he had a chance to see me back". I reassured him that we would not expect more than 50% reduction in cholesterol based on the dose and that this was treatment to target witches guidelines recommended for cholesterol management. At this point, I would estimate we've significantly reduce his risk of coronary disease. Nevertheless, he feels that it may be better to be on less medication. I do not have any arguments against that and in fact encouraged him to work on exercise, dietary changes and weight loss which she is interested in doing. He does report some mild side effects but he cannot attribute them directly to the cholesterol medicine. I asked him if he wished to take a two-week statin holiday to see if this was related to that and he said that he would be willing to do that.   PMHx:  Past Medical History  Diagnosis Date  . Hypertension   . Myasthenia gravis (HCC)     currently in remission (11/2014)   . Mixed hyperlipidemia   . History of pericarditis   . History of Bell's palsy   . GERD (gastroesophageal reflux disease)     Past Surgical History  Procedure Laterality Date  . Tonsillectomy  1950  . Laminectomy  1991  FAMHx:  History reviewed. No pertinent family history.  SOCHx:   reports that he quit smoking about 48 years ago. His smoking use included Pipe. He does not have any smokeless tobacco history on file. He reports that he drinks about 0.6 oz of alcohol per week. His drug history is not on file.  ALLERGIES:  Allergies  Allergen Reactions  . Demerol [Meperidine]     hallucinations    ROS: A comprehensive review of  systems was negative.  HOME MEDS: Current Outpatient Prescriptions  Medication Sig Dispense Refill  . aspirin 81 MG tablet Take 81 mg by mouth daily.    Marland Kitchen atorvastatin (LIPITOR) 40 MG tablet Take 1 tablet (40 mg total) by mouth daily. 30 tablet 6  . ramipril (ALTACE) 5 MG capsule Take 5 mg by mouth daily.    . tamsulosin (FLOMAX) 0.4 MG CAPS capsule Take 0.4 mg by mouth daily.    Marland Kitchen tolterodine (DETROL LA) 4 MG 24 hr capsule Take 4 mg by mouth daily.    Marland Kitchen triamterene-hydrochlorothiazide (MAXZIDE-25) 37.5-25 MG tablet Take 75 tablets by mouth daily.     No current facility-administered medications for this visit.    LABS/IMAGING: No results found for this or any previous visit (from the past 48 hour(s)). No results found.  WEIGHTS: Wt Readings from Last 3 Encounters:  06/28/15 214 lb (97.07 kg)  01/03/15 226 lb 14.4 oz (102.921 kg)  12/23/14 226 lb (102.513 kg)    VITALS: BP 120/82 mmHg  Pulse 75  Ht  (1.702 m)  Wt 214 lb (97.07 kg)  BMI 33.51 kg/m2  EXAM: Deferred  EKG: Deferred  ASSESSMENT: 1. Multivessel coronary artery calcium 2. Remote history of pericarditis 3. No significant obstructive coronary disease by heart catheterization 2005 4. Essential hypertension 5. History of myasthenia gravis 6. Dyslipidemia - at goal LDL <70, LDL-P around 1000-1050 7. Erectile dysfunction 8. Orthostatic symptoms  PLAN: 1.   [name] has had marked improvement in his cholesterol and 50% or more reduction with Lipitor. He seems to be tolerating that but may have some side effects. He's going to try a two-week statin holiday. He also reported to be some problems with erectile dysfunction which he thinks may be related to his statin. I'm not certain that this is the case however we can assess whether that improves on the holiday. He does report some dizziness with positional changes which could be related to Flomax. I advised him that he may want to consider discussing with  his urologist about possibly trying Cialis daily which may give him the opportunity to come off of the Flomax, improve his orthostatic hypotension and help with his erectile dysfunction. After this discussion he advised me that he wished to probably follow-up with either Dr. Garnette Scheuermann or Dr. Anne Fu.  Certainly he should follow-up with whatever provider he feels comfortable.  Chrystie Nose, MD, [facility] Attending Cardiologist [facility]  Chrystie Nose 06/28/2015, 1:27 PM

## 2015-07-28 ENCOUNTER — Encounter (INDEPENDENT_AMBULATORY_CARE_PROVIDER_SITE_OTHER): Payer: Medicare Other | Admitting: Ophthalmology

## 2015-07-28 ENCOUNTER — Encounter (INDEPENDENT_AMBULATORY_CARE_PROVIDER_SITE_OTHER): Payer: Self-pay | Admitting: Ophthalmology

## 2015-07-28 DIAGNOSIS — H43813 Vitreous degeneration, bilateral: Secondary | ICD-10-CM

## 2015-07-28 DIAGNOSIS — H35033 Hypertensive retinopathy, bilateral: Secondary | ICD-10-CM | POA: Diagnosis not present

## 2015-07-28 DIAGNOSIS — H318 Other specified disorders of choroid: Secondary | ICD-10-CM

## 2015-07-28 DIAGNOSIS — I10 Essential (primary) hypertension: Secondary | ICD-10-CM | POA: Diagnosis not present

## 2015-08-02 ENCOUNTER — Encounter (INDEPENDENT_AMBULATORY_CARE_PROVIDER_SITE_OTHER): Payer: Self-pay | Admitting: Ophthalmology

## 2015-08-25 ENCOUNTER — Encounter (INDEPENDENT_AMBULATORY_CARE_PROVIDER_SITE_OTHER): Payer: Medicare Other | Admitting: Ophthalmology

## 2015-08-25 DIAGNOSIS — H2513 Age-related nuclear cataract, bilateral: Secondary | ICD-10-CM

## 2015-08-25 DIAGNOSIS — H43813 Vitreous degeneration, bilateral: Secondary | ICD-10-CM | POA: Diagnosis not present

## 2015-08-25 DIAGNOSIS — H353221 Exudative age-related macular degeneration, left eye, with active choroidal neovascularization: Secondary | ICD-10-CM

## 2015-08-25 DIAGNOSIS — H35033 Hypertensive retinopathy, bilateral: Secondary | ICD-10-CM | POA: Diagnosis not present

## 2015-08-25 DIAGNOSIS — I10 Essential (primary) hypertension: Secondary | ICD-10-CM

## 2015-09-12 ENCOUNTER — Encounter: Payer: Self-pay | Admitting: Interventional Cardiology

## 2015-09-12 ENCOUNTER — Ambulatory Visit (INDEPENDENT_AMBULATORY_CARE_PROVIDER_SITE_OTHER): Payer: Medicare Other | Admitting: Interventional Cardiology

## 2015-09-12 VITALS — BP 128/68 | HR 78 | Ht 67.0 in | Wt 210.0 lb

## 2015-09-12 DIAGNOSIS — I2510 Atherosclerotic heart disease of native coronary artery without angina pectoris: Secondary | ICD-10-CM | POA: Diagnosis not present

## 2015-09-12 DIAGNOSIS — E785 Hyperlipidemia, unspecified: Secondary | ICD-10-CM | POA: Diagnosis not present

## 2015-09-12 DIAGNOSIS — I712 Thoracic aortic aneurysm, without rupture: Secondary | ICD-10-CM | POA: Diagnosis not present

## 2015-09-12 DIAGNOSIS — R931 Abnormal findings on diagnostic imaging of heart and coronary circulation: Secondary | ICD-10-CM

## 2015-09-12 DIAGNOSIS — N529 Male erectile dysfunction, unspecified: Secondary | ICD-10-CM | POA: Insufficient documentation

## 2015-09-12 DIAGNOSIS — I10 Essential (primary) hypertension: Secondary | ICD-10-CM | POA: Diagnosis not present

## 2015-09-12 DIAGNOSIS — N5201 Erectile dysfunction due to arterial insufficiency: Secondary | ICD-10-CM

## 2015-09-12 DIAGNOSIS — Z8679 Personal history of other diseases of the circulatory system: Secondary | ICD-10-CM

## 2015-09-12 DIAGNOSIS — I7121 Aneurysm of the ascending aorta, without rupture: Secondary | ICD-10-CM

## 2015-09-12 NOTE — Progress Notes (Addendum)
Cardiology Office Note    Date:  09/12/2015   ID:  [name], DOB 05-Feb-1946, MRN [id number]  PCP:  Allean Found, MD  Cardiologist: Lesleigh Noe, MD   Chief Complaint  Patient presents with  . Coronary Artery Disease    Coronary calcification noted on chest CT    History of Present Illness:  [name] is a 70 y.o. male with asymptomatic coronary artery disease noted by coronary artery calcification on CT, hyperlipidemia, hypertension, and family history of CAD.  After CT scan coincidentally identified calcification, a myocardial perfusion study was normal.  Subsequently Dr. Rennis Golden appropriately advised the patient with reference to risk factor modification. An LDL of 64 was achieved with Lipitor 40 mg per day. The patient began having side effects: erectile dysfunction, itching, and tinnitus. He had a "too weak statin holiday" and much of his complaints improved. He is now back on 20 mg per day and is not as intensely impaired. No chest discomfort.  Past Medical History:  Diagnosis Date  . GERD (gastroesophageal reflux disease)   . History of Bell's palsy   . History of pericarditis   . Hypertension   . Mixed hyperlipidemia   . Myasthenia gravis (HCC)    currently in remission (11/2014)     Past Surgical History:  Procedure Laterality Date  . LAMINECTOMY  1991  . TONSILLECTOMY  1950    Current Medications: Outpatient Medications Prior to Visit  Medication Sig Dispense Refill  . aspirin 81 MG tablet Take 81 mg by mouth daily.    . ramipril (ALTACE) 5 MG capsule Take 5 mg by mouth daily.    . tamsulosin (FLOMAX) 0.4 MG CAPS capsule Take 0.4 mg by mouth daily.    Marland Kitchen tolterodine (DETROL LA) 4 MG 24 hr capsule Take 4 mg by mouth daily.    Marland Kitchen triamterene-hydrochlorothiazide (MAXZIDE-25) 37.5-25 MG tablet Take 75 tablets by mouth daily.    Marland Kitchen atorvastatin (LIPITOR) 40 MG tablet Take 1 tablet (40 mg total) by mouth daily. 30 tablet 6   No  facility-administered medications prior to visit.      Allergies:   Demerol [meperidine]; Aminoglycosides; Beta adrenergic blockers; Calcium channel blockers; Ciprofloxacin; Iodinated diagnostic agents; Penicillamine; Procainamide hcl; Quinine derivatives; Succinylcholine chloride; and Vecuronium bromide [vecuronium]   Social History   Social History  . Marital status: Married    Spouse name: N/A  . Number of children: 1  . Years of education: Grad Sch   Occupational History  . [employer]    Social History Main Topics  . Smoking status: Former Smoker    Types: Pipe    Quit date: 12/10/1966  . Smokeless tobacco: None  . Alcohol use 0.6 oz/week    1 Glasses of wine per week  . Drug use: Unknown  . Sexual activity: Not Asked   Other Topics Concern  . None   Social History Narrative  . None     Family History:  The patient's family history includes Cancer in his brother and mother; Healthy in his daughter; Heart disease in his father.   ROS:   Please see the history of present illness.    Erectile dysfunction, tinnitus, and pruritus.  All other systems reviewed and are negative.   PHYSICAL EXAM:   VS:  BP 128/68   Pulse 78   Ht 5\' 7"  (1.702 m)   Wt 210 lb (95.3 kg)   BMI 32.89 kg/m    GEN: Well nourished, well  developed, in no acute distress  HEENT: normal  Neck: no JVD, carotid bruits, or masses Cardiac: RRR; no murmurs, rubs, or gallops,no edema  Respiratory:  clear to auscultation bilaterally, normal work of breathing GI: soft, nontender, nondistended, + BS MS: no deformity or atrophy  Skin: warm and dry, no rash Neuro:  Alert and Oriented x 3, Strength and sensation are intact Psych: euthymic mood, full affect  Wt Readings from Last 3 Encounters:  09/12/15 210 lb (95.3 kg)  06/28/15 214 lb (97.1 kg)  01/03/15 226 lb 14.4 oz (102.9 kg)      Studies/Labs Reviewed:   [id number] sinus rhythm with incomplete right bundle. Unchanged from prior  tracings.  Recent Labs: 01/13/2015: ALT 22; BUN 17; Creat 0.65; Potassium 4.1; Sodium 140   Lipid Panel    Component Value Date/Time   CHOL 127 03/31/2015 0947   CHOL 218 (H) 12/14/2014 0915   TRIG 51 03/31/2015 0947   TRIG 77 12/14/2014 0915   HDL 53 03/31/2015 0947   HDL 52 12/14/2014 0915   CHOLHDL 2.4 03/31/2015 0947   VLDL 10 03/31/2015 0947   LDLCALC 64 03/31/2015 0947   LDLCALC 151 (H) 12/14/2014 0915    Additional studies/ records that were reviewed today include:  All data recently performed by Dr. Rennis Golden including the nuclear study were reviewed. Of note is a 100 point drop and LDL cholesterol between November 2016 and February 2017.    ASSESSMENT:    1. Ascending aortic aneurysm (HCC)   2. High coronary artery calcium score   3. Essential hypertension   4. Hyperlipidemia   5. Erectile dysfunction due to arterial insufficiency   6. History of pericarditis      PLAN:  In order of problems listed above:  1. Needs to be monitored over time. Ascending aorta is less than 4.3 cm. 2. Risk factor modification with lipid lowering, diet, exercise, and weight control. Fasting liver and lipid panel will be done soon. 3. Low salt diet, exercise, discussed in detail. Also discussed weight loss. 4. Decreased saturated fat in diet, decrease Lipitor to 20 mg per day, weight control, and aerobic exercise. Liver lipid panel will be obtained fasting. 5. Improved with lower dose of statin therapy. 6. Not addressed       Mediication Adjustments/Labs and Tests Ordered: Current medicines are reviewed at length with the patient today.  Concerns regarding medicines are outlined above.  Medication changes, Labs and Tests ordered today are listed in the Patient Instructions below. Patient Instructions  Medication Instructions:  Your physician recommends that you continue on your current medications as directed. Please refer to the Current Medication list given to you today.    Labwork: Your physician recommends that you return for a FASTING lipid profile and lft on 09/13/15 the lab opens at 7:30am   Testing/Procedures: None ordered  Follow-Up: Your physician recommends that you schedule a follow-up appointment as needed   Any Other Special Instructions Will Be Listed Below (If Applicable).     If you need a refill on your cardiac medications before your next appointment, please call your pharmacy.      Signed, Lesleigh Noe, MD  09/12/2015 5:42 PM    [facility] [address] [place], [place], [place]  [address] Phone: [phone]; Fax: [phone]

## 2015-09-12 NOTE — Patient Instructions (Signed)
Medication Instructions:  Your physician recommends that you continue on your current medications as directed. Please refer to the Current Medication list given to you today.   Labwork: Your physician recommends that you return for a FASTING lipid profile and lft on 09/13/15 the lab opens at 7:30am   Testing/Procedures: None ordered  Follow-Up: Your physician recommends that you schedule a follow-up appointment as needed   Any Other Special Instructions Will Be Listed Below (If Applicable).     If you need a refill on your cardiac medications before your next appointment, please call your pharmacy.

## 2015-09-13 ENCOUNTER — Other Ambulatory Visit: Payer: Medicare Other | Admitting: *Deleted

## 2015-09-13 DIAGNOSIS — E785 Hyperlipidemia, unspecified: Secondary | ICD-10-CM

## 2015-09-13 LAB — HEPATIC FUNCTION PANEL
ALBUMIN: 4 g/dL (ref 3.6–5.1)
ALT: 19 U/L (ref 9–46)
AST: 22 U/L (ref 10–35)
Alkaline Phosphatase: 74 U/L (ref 40–115)
Bilirubin, Direct: 0.1 mg/dL (ref <=0.2)
Indirect Bilirubin: 0.5 mg/dL (ref 0.2–1.2)
Total Bilirubin: 0.6 mg/dL (ref 0.2–1.2)
Total Protein: 5.9 g/dL — ABNORMAL LOW (ref 6.1–8.1)

## 2015-09-13 LAB — LIPID PANEL
CHOL/HDL RATIO: 2.6 ratio (ref <=5.0)
CHOLESTEROL: 144 mg/dL (ref 125–200)
HDL: 56 mg/dL (ref >=40)
LDL Cholesterol: 76 mg/dL (ref <130)
TRIGLYCERIDES: 60 mg/dL (ref <150)
VLDL: 12 mg/dL (ref <30)

## 2015-09-16 ENCOUNTER — Encounter: Payer: Self-pay | Admitting: Interventional Cardiology

## 2015-09-16 MED ORDER — ATORVASTATIN CALCIUM 20 MG PO TABS: 20.0000 mg | ORAL_TABLET | Freq: Every day | ORAL | 11 refills | Status: DC

## 2015-09-22 ENCOUNTER — Encounter (INDEPENDENT_AMBULATORY_CARE_PROVIDER_SITE_OTHER): Payer: Medicare Other | Admitting: Ophthalmology

## 2015-09-22 DIAGNOSIS — H353221 Exudative age-related macular degeneration, left eye, with active choroidal neovascularization: Secondary | ICD-10-CM | POA: Diagnosis not present

## 2015-09-22 DIAGNOSIS — H35033 Hypertensive retinopathy, bilateral: Secondary | ICD-10-CM

## 2015-09-22 DIAGNOSIS — I10 Essential (primary) hypertension: Secondary | ICD-10-CM

## 2015-09-22 DIAGNOSIS — H43813 Vitreous degeneration, bilateral: Secondary | ICD-10-CM | POA: Diagnosis not present

## 2015-09-22 DIAGNOSIS — H2513 Age-related nuclear cataract, bilateral: Secondary | ICD-10-CM

## 2015-09-22 DIAGNOSIS — H353111 Nonexudative age-related macular degeneration, right eye, early dry stage: Secondary | ICD-10-CM | POA: Diagnosis not present

## 2015-10-20 ENCOUNTER — Encounter (INDEPENDENT_AMBULATORY_CARE_PROVIDER_SITE_OTHER): Payer: Medicare Other | Admitting: Ophthalmology

## 2015-10-20 DIAGNOSIS — H2513 Age-related nuclear cataract, bilateral: Secondary | ICD-10-CM

## 2015-10-20 DIAGNOSIS — I10 Essential (primary) hypertension: Secondary | ICD-10-CM | POA: Diagnosis not present

## 2015-10-20 DIAGNOSIS — H353221 Exudative age-related macular degeneration, left eye, with active choroidal neovascularization: Secondary | ICD-10-CM

## 2015-10-20 DIAGNOSIS — H35033 Hypertensive retinopathy, bilateral: Secondary | ICD-10-CM

## 2015-10-20 DIAGNOSIS — H43813 Vitreous degeneration, bilateral: Secondary | ICD-10-CM

## 2015-11-17 ENCOUNTER — Encounter (INDEPENDENT_AMBULATORY_CARE_PROVIDER_SITE_OTHER): Payer: Medicare Other | Admitting: Ophthalmology

## 2015-11-17 DIAGNOSIS — H35033 Hypertensive retinopathy, bilateral: Secondary | ICD-10-CM

## 2015-11-17 DIAGNOSIS — H43813 Vitreous degeneration, bilateral: Secondary | ICD-10-CM | POA: Diagnosis not present

## 2015-11-17 DIAGNOSIS — H353221 Exudative age-related macular degeneration, left eye, with active choroidal neovascularization: Secondary | ICD-10-CM

## 2015-11-17 DIAGNOSIS — I10 Essential (primary) hypertension: Secondary | ICD-10-CM

## 2015-12-15 ENCOUNTER — Encounter (INDEPENDENT_AMBULATORY_CARE_PROVIDER_SITE_OTHER): Payer: Medicare Other | Admitting: Ophthalmology

## 2015-12-15 DIAGNOSIS — H2513 Age-related nuclear cataract, bilateral: Secondary | ICD-10-CM

## 2015-12-15 DIAGNOSIS — H43813 Vitreous degeneration, bilateral: Secondary | ICD-10-CM | POA: Diagnosis not present

## 2015-12-15 DIAGNOSIS — H353221 Exudative age-related macular degeneration, left eye, with active choroidal neovascularization: Secondary | ICD-10-CM | POA: Diagnosis not present

## 2015-12-15 DIAGNOSIS — I10 Essential (primary) hypertension: Secondary | ICD-10-CM

## 2015-12-15 DIAGNOSIS — H35033 Hypertensive retinopathy, bilateral: Secondary | ICD-10-CM

## 2016-01-12 ENCOUNTER — Encounter (INDEPENDENT_AMBULATORY_CARE_PROVIDER_SITE_OTHER): Payer: Medicare Other | Admitting: Ophthalmology

## 2016-01-12 DIAGNOSIS — I10 Essential (primary) hypertension: Secondary | ICD-10-CM | POA: Diagnosis not present

## 2016-01-12 DIAGNOSIS — H353221 Exudative age-related macular degeneration, left eye, with active choroidal neovascularization: Secondary | ICD-10-CM | POA: Diagnosis not present

## 2016-01-12 DIAGNOSIS — H2513 Age-related nuclear cataract, bilateral: Secondary | ICD-10-CM

## 2016-01-12 DIAGNOSIS — H35033 Hypertensive retinopathy, bilateral: Secondary | ICD-10-CM | POA: Diagnosis not present

## 2016-01-12 DIAGNOSIS — H43813 Vitreous degeneration, bilateral: Secondary | ICD-10-CM

## 2016-02-16 ENCOUNTER — Encounter (INDEPENDENT_AMBULATORY_CARE_PROVIDER_SITE_OTHER): Payer: Medicare Other | Admitting: Ophthalmology

## 2016-02-16 DIAGNOSIS — H353221 Exudative age-related macular degeneration, left eye, with active choroidal neovascularization: Secondary | ICD-10-CM | POA: Diagnosis not present

## 2016-02-16 DIAGNOSIS — H2513 Age-related nuclear cataract, bilateral: Secondary | ICD-10-CM

## 2016-02-16 DIAGNOSIS — H35033 Hypertensive retinopathy, bilateral: Secondary | ICD-10-CM | POA: Diagnosis not present

## 2016-02-16 DIAGNOSIS — I10 Essential (primary) hypertension: Secondary | ICD-10-CM | POA: Diagnosis not present

## 2016-02-16 DIAGNOSIS — H43813 Vitreous degeneration, bilateral: Secondary | ICD-10-CM | POA: Diagnosis not present

## 2016-03-22 ENCOUNTER — Encounter (INDEPENDENT_AMBULATORY_CARE_PROVIDER_SITE_OTHER): Payer: Medicare Other | Admitting: Ophthalmology

## 2016-03-22 DIAGNOSIS — H353111 Nonexudative age-related macular degeneration, right eye, early dry stage: Secondary | ICD-10-CM | POA: Diagnosis not present

## 2016-03-22 DIAGNOSIS — H43813 Vitreous degeneration, bilateral: Secondary | ICD-10-CM

## 2016-03-22 DIAGNOSIS — H35033 Hypertensive retinopathy, bilateral: Secondary | ICD-10-CM

## 2016-03-22 DIAGNOSIS — I10 Essential (primary) hypertension: Secondary | ICD-10-CM | POA: Diagnosis not present

## 2016-03-22 DIAGNOSIS — H2513 Age-related nuclear cataract, bilateral: Secondary | ICD-10-CM | POA: Diagnosis not present

## 2016-03-22 DIAGNOSIS — H353221 Exudative age-related macular degeneration, left eye, with active choroidal neovascularization: Secondary | ICD-10-CM | POA: Diagnosis not present

## 2016-03-26 ENCOUNTER — Telehealth: Payer: Self-pay | Admitting: Neurology

## 2016-03-26 ENCOUNTER — Encounter: Payer: Self-pay | Admitting: Neurology

## 2016-03-26 ENCOUNTER — Ambulatory Visit (INDEPENDENT_AMBULATORY_CARE_PROVIDER_SITE_OTHER): Payer: Medicare Other | Admitting: Neurology

## 2016-03-26 DIAGNOSIS — G7000 Myasthenia gravis without (acute) exacerbation: Secondary | ICD-10-CM

## 2016-03-26 MED ORDER — MYCOPHENOLATE MOFETIL 500 MG PO TABS: 1000.0000 mg | ORAL_TABLET | Freq: Two times a day (BID) | ORAL | 12 refills | Status: DC

## 2016-03-26 MED ORDER — PYRIDOSTIGMINE BROMIDE 60 MG PO TABS: 60.0000 mg | ORAL_TABLET | Freq: Three times a day (TID) | ORAL | 6 refills | Status: DC | PRN

## 2016-03-26 NOTE — Telephone Encounter (Signed)
PA for Cellcept approved by [employer] [id number]) through 02/11/17 - pt [id number] - [id number] - valid through 02/11/17.  Pharmacy notified of approval.

## 2016-03-26 NOTE — Telephone Encounter (Signed)
Patient is needing a PA for medication mycophenolate (CELLCEPT) 500 MG tablet.  Please call [phone].

## 2016-03-26 NOTE — Progress Notes (Signed)
PATIENT: [name] DOB: 11-21-1945  Chief Complaint  Patient presents with  . Mysthenia Gravis    He is here with his wife, [name].  He was diagnosed twelve years ago. He started having problems again in December 2017 when he started feeling weakness.  He was worried Detrol LA was contributing to his symptoms and discontinued use.  Reports no further worsening but he has not returned to his baseline yet.  He was walking 8-9 miles per day.  Now, he is only walking about 3 miles without feeling the weakness.  Marland Kitchen PCP    [name], MD     HISTORICAL  [name] 71 years old right-handed, accompanied by his wife, seen in refer by primary care physician Dr.  [name] for evaluation of weakness, initial evaluation was March 26 2016.  He was a patient of our clinicIn the past, most recent visit was in October 2011, he had history of acetylcholine receptor antibody positive myasthenia gravis, hypertension,   Myasthenia gravis, he presented with excessive fatigue, intermittent ptosis in June 2006, at its worst, 2 months after symptom onset, he developed mild breathing difficulty, head dropped, upper and lower extremity weakness, the diagnosis is confirmed by positive acetylcholine receptor antibody, single fiber EMG of right extensor digitorum brevis, frontalis, orbicularis oris oculi by Dr. Dimas Aguas at [facility], CT of the chest fail to demonstrate abnormality.  He responded very well to CellCept 1000 mg twice a day, quick improvement within 2 months after he received CellCept treatment, never was treated with prednisone, for a while, he required as needed Mestinon, he was eventually able to taper off CellCept at the end of 2011 with no recurrent symptoms.,   Around December 2017, he noticed he gets fatigue easily, on February 08 2016, while he walking dogs, he notice shortness of breath after 1 mile, chest heaviness, symptoms has been persistent since then, he  denies difficulty breathing in a resting position, no chewing swallowing difficulty no double vision, wife noticed he did not fatigued pole left ptosis, he also noticed weakness in his arms, feel like he has worked out hard,  He has prostate hypertrophy since 2015, was treated with off alpha 2 agonist Flomax, and acetylcholine receptor agonist tolterodine  REVIEW OF SYSTEMS: Full 14 system review of systems performed and notable only for memory loss, numbness, ringing ears  ALLERGIES: Allergies  Allergen Reactions  . Demerol [Meperidine]     hallucinations  . Aminoglycosides     Can not use due to mysthenia gravis  . Beta Adrenergic Blockers     Can not use due to mysthenia gravis  . Calcium Channel Blockers     Can not use due to mysthenia gravis  . Ciprofloxacin     Can not use due to mysthenia gravis  . Iodinated Diagnostic Agents     Can not use due to mysthenia gravis  . Penicillamine     Can not use due to mysthenia gravis  . Procainamide Hcl     Can not use due to mysthenia gravis  . Quinine Derivatives     Can not use due to mysthenia gravis  . Succinylcholine Chloride     Can not use due to mysthenia gravis  . Vecuronium Bromide [Vecuronium]     Can not use due to mysthenia gravis    HOME MEDICATIONS: Current Outpatient Prescriptions  Medication Sig Dispense Refill  . Alpha-Lipoic Acid 300 MG CAPS Take 1 capsule by mouth  daily.    . aspirin 81 MG tablet Take 81 mg by mouth daily.    Marland Kitchen. atorvastatin (LIPITOR) 20 MG tablet Take 1 tablet (20 mg total) by mouth daily. 30 tablet 11  . Coenzyme Q10 (COQ-10) 100 MG CAPS Take 1 capsule by mouth 3 (three) times daily.    . Glucosamine HCl ([employer] GLUCOSAMINE) 1500 MG TABS Take 1 tablet by mouth daily.    . Multiple Vitamins-Minerals (CENTRUM SILVER PO) Take 1 tablet by mouth daily.    . Omega-3 Fatty Acids (OMEGA-3 PO) Take 1,040 mg by mouth daily.    . ramipril (ALTACE) 5 MG capsule Take 5 mg by mouth daily.    Marland Kitchen. Resveratrol  100 MG CAPS Take 1 capsule by mouth 2 (two) times daily.    . tamsulosin (FLOMAX) 0.4 MG CAPS capsule Take 0.4 mg by mouth daily.    Marland Kitchen. triamterene-hydrochlorothiazide (MAXZIDE-25) 37.5-25 MG tablet Take 75 tablets by mouth daily.    . TURMERIC PO Take 100 mg by mouth 2 (two) times daily.     No current facility-administered medications for this visit.     PAST MEDICAL HISTORY: Past Medical History:  Diagnosis Date  . GERD (gastroesophageal reflux disease)   . History of Bell's palsy   . History of pericarditis   . Hypertension   . Mixed hyperlipidemia   . Myasthenia gravis (HCC)    currently in remission (11/2014)     PAST SURGICAL HISTORY: Past Surgical History:  Procedure Laterality Date  . LAMINECTOMY  1991  . TONSILLECTOMY  1950    FAMILY HISTORY: Family History  Problem Relation Age of Onset  . Cancer Mother   . Heart disease Father   . Cancer Brother     bladder/ in remission  . Healthy Daughter     age 73    SOCIAL HISTORY:  Social History   Social History  . Marital status: Married    Spouse name: N/A  . Number of children: 1  . Years of education: Grad Sch   Occupational History  . [employer]    Social History Main Topics  . Smoking status: Former Smoker    Types: Pipe    Quit date: 12/10/1966  . Smokeless tobacco: Never Used  . Alcohol use 0.6 oz/week    1 Glasses of wine per week  . Drug use: Unknown  . Sexual activity: Not on file   Other Topics Concern  . Not on file   Social History Narrative   Lives at home with his wife.   Right-handed.   Occasional caffeine use.     PHYSICAL EXAM   Vitals:   03/26/16 0852  BP: 139/90  Pulse: 68  Weight: 216 lb (98 kg)  Height: 5\' 7"  (1.702 m)    Not recorded      Body mass index is 33.83 kg/m.  PHYSICAL EXAMNIATION:  Gen: NAD, conversant, well nourised, obese, well groomed                     Cardiovascular: Regular rate rhythm, no peripheral edema, warm,  nontender. Eyes: Conjunctivae clear without exudates or hemorrhage Neck: Supple, no carotid bruits. Pulmonary: Clear to auscultation bilaterally   NEUROLOGICAL EXAM:  MENTAL STATUS: Speech:    Speech is normal; fluent and spontaneous with normal comprehension.  Cognition:     Orientation to time, place and person     Normal recent and remote memory     Normal Attention span and  concentration     Normal Language, naming, repeating,spontaneous speech     Fund of knowledge   CRANIAL NERVES: CN II: Visual fields are full to confrontation. Fundoscopic exam is normal with sharp discs and no vascular changes. Pupils are round equal and briskly reactive to light. CN III, IV, VI: extraocular movement are normal. No ptosis.  Cover and uncover test showed mild exophoria, right worse than left, red lens testing showed weakness at left medial rectus CN V: Facial sensation is intact to pinprick in all 3 divisions bilaterally. Corneal responses are intact.  CN VII: Face is symmetric with normal eye closure and smile. CN VIII: Hearing is normal to rubbing fingers CN IX, X: Palate elevates symmetrically. Phonation is normal. CN XI: Head turning and shoulder shrug are intact CN XII: Tongue is midline with normal movements and no atrophy.  MOTOR: He has mild neck flexion, bilateral shoulder abduction, external rotation and bilateral hip flexion weakness.  REFLEXES: Reflexes are 2+ and symmetric at the biceps, triceps, knees, and ankles. Plantar responses are flexor.  SENSORY: Intact to light touch, pinprick, positional sensation and vibratory sensation are intact in fingers and toes.  COORDINATION: Rapid alternating movements and fine finger movements are intact. There is no dysmetria on finger-to-nose and heel-knee-shin.    GAIT/STANCE: Posture is normal. Gait is steady with normal steps, base, arm swing, and turning. Heel and toe walking are normal. Tandem gait is normal.  Romberg is  absent.   DIAGNOSTIC DATA (LABS, IMAGING, TESTING) - I reviewed patient records, labs, notes, testing and imaging myself where available.   ASSESSMENT AND PLAN  TYRICE HEWITT is a 71 y.o. male   Seropositive generalized myasthenia gravis , recurrent weakness since December 2017,  Laboratory evaluations, including acetylcholine receptor antibodies  Mestinon 60 mg as needed  CellCept 500 mg 2 tablets twice a day  Levert Feinstein, M.D. Ph.D.  [facility] [address] [place], [place] [address] Ph: [phone] Fax: [phone]  CC: [name],

## 2016-04-04 LAB — ACETYLCHOLINE RECEPTOR AB, ALL
ACHR BINDING AB, SERUM: 22.8 nmol/L — AB (ref 0.00–0.24)
Acetylchol Block Ab: 34 % — ABNORMAL HIGH (ref 0–25)
Acetylcholine Modulat Ab: 54 % — ABNORMAL HIGH (ref 0–20)

## 2016-04-04 LAB — COMPREHENSIVE METABOLIC PANEL
ALT: 20 IU/L (ref 0–44)
AST: 22 IU/L (ref 0–40)
Albumin/Globulin Ratio: 1.9 (ref 1.2–2.2)
Albumin: 4.3 g/dL (ref 3.5–4.8)
Alkaline Phosphatase: 88 IU/L (ref 39–117)
BUN/Creatinine Ratio: 21 (ref 10–24)
BUN: 15 mg/dL (ref 8–27)
Bilirubin Total: 0.4 mg/dL (ref 0.0–1.2)
CO2: 24 mmol/L (ref 18–29)
CREATININE: 0.71 mg/dL — AB (ref 0.76–1.27)
Calcium: 9.4 mg/dL (ref 8.6–10.2)
Chloride: 103 mmol/L (ref 96–106)
GFR calc Af Amer: 108 (ref >59)
GFR calc non Af Amer: 94 (ref >59)
GLUCOSE: 90 mg/dL (ref 65–99)
Globulin, Total: 2.3 (ref 1.5–4.5)
Potassium: 4.4 mmol/L (ref 3.5–5.2)
Sodium: 146 mmol/L — ABNORMAL HIGH (ref 134–144)
Total Protein: 6.6 g/dL (ref 6.0–8.5)

## 2016-04-04 LAB — CBC WITH DIFFERENTIAL
BASOS ABS: 0.1 10*3/uL (ref 0.0–0.2)
Basos: 1 %
EOS (ABSOLUTE): 0.4 10*3/uL (ref 0.0–0.4)
Eos: 5 %
Hematocrit: 45.1 % (ref 37.5–51.0)
Hemoglobin: 14.7 g/dL (ref 13.0–17.7)
IMMATURE GRANULOCYTES: 0 %
Immature Grans (Abs): 0 10*3/uL (ref 0.0–0.1)
LYMPHS ABS: 1.1 10*3/uL (ref 0.7–3.1)
Lymphs: 13 %
MCH: 30.9 pg (ref 26.6–33.0)
MCHC: 32.6 g/dL (ref 31.5–35.7)
MCV: 95 fL (ref 79–97)
MONOS ABS: 0.6 10*3/uL (ref 0.1–0.9)
Monocytes: 8 %
Neutrophils Absolute: 5.8 10*3/uL (ref 1.4–7.0)
Neutrophils: 73 %
RBC: 4.76 x10E6/uL (ref 4.14–5.80)
RDW: 14 % (ref 12.3–15.4)
WBC: 7.9 10*3/uL (ref 3.4–10.8)

## 2016-04-04 LAB — TSH: TSH: 1.17 u[IU]/mL (ref 0.450–4.500)

## 2016-04-26 ENCOUNTER — Encounter (INDEPENDENT_AMBULATORY_CARE_PROVIDER_SITE_OTHER): Payer: Medicare Other | Admitting: Ophthalmology

## 2016-04-26 DIAGNOSIS — H43813 Vitreous degeneration, bilateral: Secondary | ICD-10-CM

## 2016-04-26 DIAGNOSIS — H2513 Age-related nuclear cataract, bilateral: Secondary | ICD-10-CM | POA: Diagnosis not present

## 2016-04-26 DIAGNOSIS — I10 Essential (primary) hypertension: Secondary | ICD-10-CM

## 2016-04-26 DIAGNOSIS — H353111 Nonexudative age-related macular degeneration, right eye, early dry stage: Secondary | ICD-10-CM

## 2016-04-26 DIAGNOSIS — H35033 Hypertensive retinopathy, bilateral: Secondary | ICD-10-CM

## 2016-04-26 DIAGNOSIS — H353221 Exudative age-related macular degeneration, left eye, with active choroidal neovascularization: Secondary | ICD-10-CM

## 2016-05-23 ENCOUNTER — Ambulatory Visit (INDEPENDENT_AMBULATORY_CARE_PROVIDER_SITE_OTHER): Payer: Medicare Other | Admitting: Neurology

## 2016-05-23 VITALS — BP 124/74 | HR 66 | Ht 67.0 in | Wt 216.2 lb

## 2016-05-23 DIAGNOSIS — G7000 Myasthenia gravis without (acute) exacerbation: Secondary | ICD-10-CM | POA: Diagnosis not present

## 2016-05-23 NOTE — Progress Notes (Signed)
PATIENT: [name] DOB: 05-16-1945  Chief Complaint  Patient presents with  . Myasthenia Gravis    Reports still taking CellCept , BID and he is taking Mestinon , 0.5 tablet BID.  Feel his weakness has slightly improved but he still has not be able to return to his normal daily walking routine.     HISTORICAL  [name] 71 years old right-handed, accompanied by his wife, seen in refer by primary care physician Dr.  [name] for evaluation of weakness, initial evaluation was March 26 2016.  He was a patient of our clinicIn the past, most recent visit was in October 2011, he had history of acetylcholine receptor antibody positive myasthenia gravis, hypertension,   Myasthenia gravis, he presented with excessive fatigue, intermittent ptosis in June 2006, at its worst, 2 months after symptom onset, he developed mild breathing difficulty, head dropped, upper and lower extremity weakness, the diagnosis is confirmed by positive acetylcholine receptor antibody, single fiber EMG of right extensor digitorum brevis, frontalis, orbicularis oris oculi by Dr. Dimas Aguas at [facility], CT of the chest fail to demonstrate abnormality.  He responded very well to CellCept 1000 mg twice a day, quick improvement within 2 months after he received CellCept treatment, never was treated with prednisone, for a while, he required as needed Mestinon, he was eventually able to taper off CellCept at the end of 2011 with no recurrent symptoms.,   Around December 2017, he noticed he gets fatigue easily, on February 08 2016, while he walking dogs, he notice shortness of breath after 1 mile, chest heaviness, symptoms has been persistent since then, he denies difficulty breathing in a resting position, no chewing swallowing difficulty no double vision, wife noticed he did not fatigued pole left ptosis, he also noticed weakness in his arms, feel like he has worked out hard,  He has  prostate hypertrophy since 2015, was treated with off alpha 2 agonist Flomax, and acetylcholine receptor agonist tolterodine  UPDATE May 23 2016: He is now taking Cellcept  2 tab twice a day, he has no double vision, mestinon  1/2 tab bid prn,   He walks his dog 5 miles a day without much difficulty  I reviewed the laboratory evaluation in February 2018: Elevated acetylcholine binding antibody 22.8, blocking antibody 34, normal TSH 1.17, CMP creatinine of 0.71, CBC, hemoglobin 14 point 7,  REVIEW OF SYSTEMS: Full 14 system review of systems performed and notable only for weakness, fatigue, ringing ears  ALLERGIES: Allergies  Allergen Reactions  . Demerol [Meperidine]     hallucinations  . Aminoglycosides     Can not use due to mysthenia gravis  . Beta Adrenergic Blockers     Can not use due to mysthenia gravis  . Calcium Channel Blockers     Can not use due to mysthenia gravis  . Ciprofloxacin     Can not use due to mysthenia gravis  . Iodinated Diagnostic Agents     Can not use due to mysthenia gravis  . Penicillamine     Can not use due to mysthenia gravis  . Procainamide Hcl     Can not use due to mysthenia gravis  . Quinine Derivatives     Can not use due to mysthenia gravis  . Succinylcholine Chloride     Can not use due to mysthenia gravis  . Vecuronium Bromide [Vecuronium]     Can not use due to mysthenia gravis    HOME MEDICATIONS:  Current Outpatient Prescriptions  Medication Sig Dispense Refill  . Alpha-Lipoic Acid 300 MG CAPS Take 1 capsule by mouth daily.    Marland Kitchen aspirin 81 MG tablet Take 81 mg by mouth daily.    Marland Kitchen atorvastatin (LIPITOR) 20 MG tablet Take 1 tablet (20 mg total) by mouth daily. 30 tablet 11  . Coenzyme Q10 (COQ-10) 100 MG CAPS Take 1 capsule by mouth daily.     . Multiple Vitamins-Minerals (CENTRUM SILVER PO) Take 1 tablet by mouth daily.    . mycophenolate (CELLCEPT) 500 MG tablet Take 2 tablets (1,000 mg total) by mouth 2 (two) times  daily. 120 tablet 12  . Omega-3 Fatty Acids (OMEGA-3 PO) Take 1,040 mg by mouth daily.    Marland Kitchen pyridostigmine (MESTINON) 60 MG tablet Take 1 tablet (60 mg total) by mouth 3 (three) times daily as needed. 90 tablet 6  . ramipril (ALTACE) 5 MG capsule Take 5 mg by mouth daily.    Marland Kitchen Resveratrol 100 MG CAPS Take 1 capsule by mouth 2 (two) times daily.    . tamsulosin (FLOMAX) 0.4 MG CAPS capsule Take 0.4 mg by mouth daily.    Marland Kitchen triamterene-hydrochlorothiazide (MAXZIDE-25) 37.5-25 MG tablet Take 75 tablets by mouth daily.    . TURMERIC PO Take 100 mg by mouth 2 (two) times daily.     No current facility-administered medications for this visit.     PAST MEDICAL HISTORY: Past Medical History:  Diagnosis Date  . GERD (gastroesophageal reflux disease)   . History of Bell's palsy   . History of pericarditis   . Hypertension   . Mixed hyperlipidemia   . Myasthenia gravis (HCC)    currently in remission (11/2014)     PAST SURGICAL HISTORY: Past Surgical History:  Procedure Laterality Date  . LAMINECTOMY  1991  . TONSILLECTOMY  1950    FAMILY HISTORY: Family History  Problem Relation Age of Onset  . Cancer Mother   . Heart disease Father   . Cancer Brother     bladder/ in remission  . Healthy Daughter     age 70    SOCIAL HISTORY:  Social History   Social History  . Marital status: Married    Spouse name: N/A  . Number of children: 1  . Years of education: Grad Sch   Occupational History  . [employer]    Social History Main Topics  . Smoking status: Former Smoker    Types: Pipe    Quit date: 12/10/1966  . Smokeless tobacco: Never Used  . Alcohol use 0.6 oz/week    1 Glasses of wine per week  . Drug use: Unknown  . Sexual activity: Not on file   Other Topics Concern  . Not on file   Social History Narrative   Lives at home with his wife.   Right-handed.   Occasional caffeine use.     PHYSICAL EXAM   Vitals:   05/23/16 1213  BP: 124/74  Pulse: 66   Weight: 216 lb 4 oz (98.1 kg)  Height:  (1.702 m)    Not recorded      Body mass index is 33.87 kg/m.  PHYSICAL EXAMNIATION:  Gen: NAD, conversant, well nourised, obese, well groomed                     Cardiovascular: Regular rate rhythm, no peripheral edema, warm, nontender. Eyes: Conjunctivae clear without exudates or hemorrhage Neck: Supple, no carotid bruits. Pulmonary: Clear to auscultation bilaterally  NEUROLOGICAL EXAM:  MENTAL STATUS: Speech:    Speech is normal; fluent and spontaneous with normal comprehension.  Cognition:     Orientation to time, place and person     Normal recent and remote memory     Normal Attention span and concentration     Normal Language, naming, repeating,spontaneous speech     Fund of knowledge   CRANIAL NERVES: CN II: Visual fields are full to confrontation. Fundoscopic exam is normal with sharp discs and no vascular changes. Pupils are round equal and briskly reactive to light. CN III, IV, VI: extraocular movement are normal. No ptosis.  Cover and uncover test showed mild exophoria, right worse than left, red lens testing showed weakness at left medial rectus CN V: Facial sensation is intact to pinprick in all 3 divisions bilaterally. Corneal responses are intact.  CN VII: Face is symmetric with normal eye closure and smile. CN VIII: Hearing is normal to rubbing fingers CN IX, X: Palate elevates symmetrically. Phonation is normal. CN XI: Head turning and shoulder shrug are intact CN XII: Tongue is midline with normal movements and no atrophy.  MOTOR: He has mild neck flexion, bilateral shoulder abduction, external rotation and bilateral hip flexion weakness.  REFLEXES: Reflexes are 2+ and symmetric at the biceps, triceps, knees, and ankles. Plantar responses are flexor.  SENSORY: Intact to light touch, pinprick, positional sensation and vibratory sensation are intact in fingers and toes.  COORDINATION: Rapid alternating  movements and fine finger movements are intact. There is no dysmetria on finger-to-nose and heel-knee-shin.    GAIT/STANCE: Posture is normal. Gait is steady with normal steps, base, arm swing, and turning. Heel and toe walking are normal. Tandem gait is normal.  Romberg is absent.   DIAGNOSTIC DATA (LABS, IMAGING, TESTING) - I reviewed patient records, labs, notes, testing and imaging myself where available.   ASSESSMENT AND PLAN  KIONTE BAUMGARDNER is a 71 y.o. male   Seropositive generalized myasthenia gravis , recurrent weakness since December 2017,  Moderate improvement with CellCept 500 mg 2 tablets twice a day  Continue tapering off Mestinon  Laboratory evaluation today  Levert Feinstein, M.D. Ph.D.  [facility] [address], [address] [place], [place] [address] Ph: [phone] Fax: [phone]  CC: [name],

## 2016-05-24 LAB — CBC
HEMATOCRIT: 44.7 % (ref 37.5–51.0)
HEMOGLOBIN: 15.2 g/dL (ref 13.0–17.7)
MCH: 30.9 pg (ref 26.6–33.0)
MCHC: 34 g/dL (ref 31.5–35.7)
MCV: 91 fL (ref 79–97)
Platelets: 203 10*3/uL (ref 150–379)
RBC: 4.92 x10E6/uL (ref 4.14–5.80)
RDW: 14.2 % (ref 12.3–15.4)
WBC: 8.4 10*3/uL (ref 3.4–10.8)

## 2016-05-24 LAB — COMPREHENSIVE METABOLIC PANEL
ALT: 19 IU/L (ref 0–44)
AST: 25 IU/L (ref 0–40)
Albumin/Globulin Ratio: 2.2 (ref 1.2–2.2)
Albumin: 4.4 g/dL (ref 3.5–4.8)
Alkaline Phosphatase: 90 IU/L (ref 39–117)
BILIRUBIN TOTAL: 0.5 mg/dL (ref 0.0–1.2)
BUN/Creatinine Ratio: 16 (ref 10–24)
BUN: 13 mg/dL (ref 8–27)
CHLORIDE: 102 mmol/L (ref 96–106)
CO2: 27 mmol/L (ref 18–29)
Calcium: 9.4 mg/dL (ref 8.6–10.2)
Creatinine, Ser: 0.81 mg/dL (ref 0.76–1.27)
GFR calc non Af Amer: 89 mL/min/{1.73_m2} (ref >59)
GFR, EST AFRICAN AMERICAN: 103 mL/min/{1.73_m2} (ref >59)
GLUCOSE: 94 mg/dL (ref 65–99)
Globulin, Total: 2 g/dL (ref 1.5–4.5)
Potassium: 4.6 mmol/L (ref 3.5–5.2)
Sodium: 143 mmol/L (ref 134–144)
TOTAL PROTEIN: 6.4 g/dL (ref 6.0–8.5)

## 2016-05-31 ENCOUNTER — Encounter (INDEPENDENT_AMBULATORY_CARE_PROVIDER_SITE_OTHER): Payer: Medicare Other | Admitting: Ophthalmology

## 2016-05-31 DIAGNOSIS — H35033 Hypertensive retinopathy, bilateral: Secondary | ICD-10-CM

## 2016-05-31 DIAGNOSIS — H43813 Vitreous degeneration, bilateral: Secondary | ICD-10-CM | POA: Diagnosis not present

## 2016-05-31 DIAGNOSIS — H353111 Nonexudative age-related macular degeneration, right eye, early dry stage: Secondary | ICD-10-CM

## 2016-05-31 DIAGNOSIS — H353221 Exudative age-related macular degeneration, left eye, with active choroidal neovascularization: Secondary | ICD-10-CM | POA: Diagnosis not present

## 2016-05-31 DIAGNOSIS — I10 Essential (primary) hypertension: Secondary | ICD-10-CM

## 2016-07-12 ENCOUNTER — Encounter (INDEPENDENT_AMBULATORY_CARE_PROVIDER_SITE_OTHER): Payer: Medicare Other | Admitting: Ophthalmology

## 2016-07-12 DIAGNOSIS — I10 Essential (primary) hypertension: Secondary | ICD-10-CM

## 2016-07-12 DIAGNOSIS — H35033 Hypertensive retinopathy, bilateral: Secondary | ICD-10-CM

## 2016-07-12 DIAGNOSIS — H353231 Exudative age-related macular degeneration, bilateral, with active choroidal neovascularization: Secondary | ICD-10-CM | POA: Diagnosis not present

## 2016-07-12 DIAGNOSIS — H353111 Nonexudative age-related macular degeneration, right eye, early dry stage: Secondary | ICD-10-CM | POA: Diagnosis not present

## 2016-07-12 DIAGNOSIS — H43813 Vitreous degeneration, bilateral: Secondary | ICD-10-CM

## 2016-07-12 DIAGNOSIS — H2513 Age-related nuclear cataract, bilateral: Secondary | ICD-10-CM | POA: Diagnosis not present

## 2016-08-30 ENCOUNTER — Encounter (INDEPENDENT_AMBULATORY_CARE_PROVIDER_SITE_OTHER): Payer: Medicare Other | Admitting: Ophthalmology

## 2016-08-30 DIAGNOSIS — H35033 Hypertensive retinopathy, bilateral: Secondary | ICD-10-CM | POA: Diagnosis not present

## 2016-08-30 DIAGNOSIS — H353111 Nonexudative age-related macular degeneration, right eye, early dry stage: Secondary | ICD-10-CM

## 2016-08-30 DIAGNOSIS — H353221 Exudative age-related macular degeneration, left eye, with active choroidal neovascularization: Secondary | ICD-10-CM

## 2016-08-30 DIAGNOSIS — I10 Essential (primary) hypertension: Secondary | ICD-10-CM

## 2016-08-30 DIAGNOSIS — H43813 Vitreous degeneration, bilateral: Secondary | ICD-10-CM

## 2016-09-16 ENCOUNTER — Other Ambulatory Visit: Payer: Self-pay | Admitting: Interventional Cardiology

## 2016-09-20 ENCOUNTER — Emergency Department (HOSPITAL_COMMUNITY): Payer: Medicare Other

## 2016-09-20 ENCOUNTER — Observation Stay (HOSPITAL_COMMUNITY)
Admission: EM | Admit: 2016-09-20 | Discharge: 2016-09-21 | Disposition: A | Discharge status: Home / Self Care | Payer: Medicare Other | Attending: Cardiovascular Disease | Admitting: Cardiovascular Disease

## 2016-09-20 ENCOUNTER — Encounter (HOSPITAL_COMMUNITY): Payer: Self-pay

## 2016-09-20 DIAGNOSIS — Z8052 Family history of malignant neoplasm of bladder: Secondary | ICD-10-CM | POA: Diagnosis not present

## 2016-09-20 DIAGNOSIS — I4891 Unspecified atrial fibrillation: Secondary | ICD-10-CM

## 2016-09-20 DIAGNOSIS — R531 Weakness: Secondary | ICD-10-CM | POA: Diagnosis not present

## 2016-09-20 DIAGNOSIS — I4581 Long QT syndrome: Secondary | ICD-10-CM | POA: Diagnosis not present

## 2016-09-20 DIAGNOSIS — N400 Enlarged prostate without lower urinary tract symptoms: Secondary | ICD-10-CM | POA: Diagnosis not present

## 2016-09-20 DIAGNOSIS — I340 Nonrheumatic mitral (valve) insufficiency: Secondary | ICD-10-CM | POA: Insufficient documentation

## 2016-09-20 DIAGNOSIS — Z79899 Other long term (current) drug therapy: Secondary | ICD-10-CM | POA: Insufficient documentation

## 2016-09-20 DIAGNOSIS — Z8249 Family history of ischemic heart disease and other diseases of the circulatory system: Secondary | ICD-10-CM | POA: Insufficient documentation

## 2016-09-20 DIAGNOSIS — I481 Persistent atrial fibrillation: Secondary | ICD-10-CM | POA: Diagnosis not present

## 2016-09-20 DIAGNOSIS — R000 Tachycardia, unspecified: Secondary | ICD-10-CM | POA: Diagnosis not present

## 2016-09-20 DIAGNOSIS — I4819 Other persistent atrial fibrillation: Secondary | ICD-10-CM

## 2016-09-20 DIAGNOSIS — Z880 Allergy status to penicillin: Secondary | ICD-10-CM | POA: Diagnosis not present

## 2016-09-20 DIAGNOSIS — R05 Cough: Secondary | ICD-10-CM | POA: Diagnosis not present

## 2016-09-20 DIAGNOSIS — I10 Essential (primary) hypertension: Secondary | ICD-10-CM | POA: Diagnosis present

## 2016-09-20 DIAGNOSIS — E782 Mixed hyperlipidemia: Secondary | ICD-10-CM | POA: Diagnosis not present

## 2016-09-20 DIAGNOSIS — Z8669 Personal history of other diseases of the nervous system and sense organs: Secondary | ICD-10-CM | POA: Diagnosis not present

## 2016-09-20 DIAGNOSIS — K219 Gastro-esophageal reflux disease without esophagitis: Secondary | ICD-10-CM | POA: Insufficient documentation

## 2016-09-20 DIAGNOSIS — Z87891 Personal history of nicotine dependence: Secondary | ICD-10-CM | POA: Diagnosis not present

## 2016-09-20 DIAGNOSIS — Z885 Allergy status to narcotic agent status: Secondary | ICD-10-CM | POA: Diagnosis not present

## 2016-09-20 DIAGNOSIS — Z809 Family history of malignant neoplasm, unspecified: Secondary | ICD-10-CM | POA: Insufficient documentation

## 2016-09-20 DIAGNOSIS — Z888 Allergy status to other drugs, medicaments and biological substances status: Secondary | ICD-10-CM | POA: Diagnosis not present

## 2016-09-20 DIAGNOSIS — Z7982 Long term (current) use of aspirin: Secondary | ICD-10-CM | POA: Insufficient documentation

## 2016-09-20 DIAGNOSIS — Z7901 Long term (current) use of anticoagulants: Secondary | ICD-10-CM | POA: Insufficient documentation

## 2016-09-20 DIAGNOSIS — I4510 Unspecified right bundle-branch block: Secondary | ICD-10-CM | POA: Insufficient documentation

## 2016-09-20 DIAGNOSIS — R0600 Dyspnea, unspecified: Secondary | ICD-10-CM | POA: Diagnosis not present

## 2016-09-20 DIAGNOSIS — G7000 Myasthenia gravis without (acute) exacerbation: Secondary | ICD-10-CM | POA: Diagnosis present

## 2016-09-20 LAB — PROTIME-INR
INR: 1.11
Prothrombin Time: 14.4 seconds (ref 11.4–15.2)

## 2016-09-20 LAB — HEPATIC FUNCTION PANEL
ALT: 24 U/L (ref 17–63)
AST: 20 U/L (ref 15–41)
Albumin: 3.7 g/dL (ref 3.5–5.0)
Alkaline Phosphatase: 69 U/L (ref 38–126)
BILIRUBIN DIRECT: 0.2 mg/dL (ref 0.1–0.5)
Indirect Bilirubin: 0.4 mg/dL (ref 0.3–0.9)
Total Bilirubin: 0.6 mg/dL (ref 0.3–1.2)
Total Protein: 6.4 g/dL — ABNORMAL LOW (ref 6.5–8.1)

## 2016-09-20 LAB — CBC
HCT: 44.4 % (ref 39.0–52.0)
HEMOGLOBIN: 14.6 g/dL (ref 13.0–17.0)
MCH: 30.7 pg (ref 26.0–34.0)
MCHC: 32.9 g/dL (ref 30.0–36.0)
MCV: 93.3 fL (ref 78.0–100.0)
Platelets: 193 10*3/uL (ref 150–400)
RBC: 4.76 MIL/uL (ref 4.22–5.81)
RDW: 13.6 % (ref 11.5–15.5)
WBC: 9.8 10*3/uL (ref 4.0–10.5)

## 2016-09-20 LAB — BASIC METABOLIC PANEL
ANION GAP: 10 (ref 5–15)
BUN: 20 mg/dL (ref 6–20)
CALCIUM: 9 mg/dL (ref 8.9–10.3)
CHLORIDE: 106 mmol/L (ref 101–111)
CO2: 24 mmol/L (ref 22–32)
Creatinine, Ser: 0.94 mg/dL (ref 0.61–1.24)
GFR calc non Af Amer: >60 mL/min (ref >60)
Glucose, Bld: 101 mg/dL — ABNORMAL HIGH (ref 65–99)
POTASSIUM: 3.9 mmol/L (ref 3.5–5.1)
Sodium: 140 mmol/L (ref 135–145)

## 2016-09-20 LAB — BRAIN NATRIURETIC PEPTIDE: B Natriuretic Peptide: 319.2 pg/mL — ABNORMAL HIGH (ref 0.0–100.0)

## 2016-09-20 MED ORDER — TAMSULOSIN HCL 0.4 MG PO CAPS
0.4000 mg | ORAL_CAPSULE | Freq: Every day | ORAL | Status: DC
  Administered 2016-09-21: 0.4 mg via ORAL
  Filled 2016-09-20: qty 1

## 2016-09-20 MED ORDER — PYRIDOSTIGMINE BROMIDE 60 MG PO TABS
60.0000 mg | ORAL_TABLET | Freq: Three times a day (TID) | ORAL | Status: DC | PRN
  Filled 2016-09-20: qty 1

## 2016-09-20 MED ORDER — SODIUM CHLORIDE 0.9 % IV SOLN
250.0000 mL | INTRAVENOUS | Status: DC
  Administered 2016-09-20: 250 mL via INTRAVENOUS

## 2016-09-20 MED ORDER — MYCOPHENOLATE MOFETIL 250 MG PO CAPS
1000.0000 mg | ORAL_CAPSULE | Freq: Two times a day (BID) | ORAL | Status: DC
  Filled 2016-09-20 (×2): qty 4

## 2016-09-20 MED ORDER — MYCOPHENOLATE MOFETIL 500 MG PO TABS
1000.0000 mg | ORAL_TABLET | Freq: Two times a day (BID) | ORAL | Status: DC
  Filled 2016-09-20 (×3): qty 2

## 2016-09-20 MED ORDER — ATORVASTATIN CALCIUM 20 MG PO TABS: 20.0000 mg | ORAL_TABLET | Freq: Every day | ORAL | Status: DC

## 2016-09-20 MED ORDER — SODIUM CHLORIDE 0.9 % IV SOLN
INTRAVENOUS | Status: DC
  Administered 2016-09-20 – 2016-09-21 (×2): via INTRAVENOUS

## 2016-09-20 MED ORDER — RESVERATROL 100 MG PO CAPS: 1.0000 | ORAL_CAPSULE | Freq: Two times a day (BID) | ORAL | Status: DC

## 2016-09-20 MED ORDER — RAMIPRIL 5 MG PO CAPS
5.0000 mg | ORAL_CAPSULE | Freq: Every day | ORAL | Status: DC
  Administered 2016-09-21: 5 mg via ORAL
  Filled 2016-09-20: qty 1

## 2016-09-20 MED ORDER — ONDANSETRON HCL 4 MG/2ML IJ SOLN
4.0000 mg | Freq: Four times a day (QID) | INTRAMUSCULAR | Status: DC | PRN
Start: 2016-09-20 — End: 2016-09-21

## 2016-09-20 MED ORDER — DILTIAZEM HCL 25 MG/5ML IV SOLN
10.0000 mg | Freq: Once | INTRAVENOUS | Status: AC
  Administered 2016-09-20: 10 mg via INTRAVENOUS

## 2016-09-20 MED ORDER — ALPHA-LIPOIC ACID 300 MG PO CAPS: 1.0000 | ORAL_CAPSULE | Freq: Every day | ORAL | Status: DC

## 2016-09-20 MED ORDER — SODIUM CHLORIDE 0.9% FLUSH
3.0000 mL | Freq: Two times a day (BID) | INTRAVENOUS | Status: DC
  Administered 2016-09-20 – 2016-09-21 (×2): 3 mL via INTRAVENOUS

## 2016-09-20 MED ORDER — DILTIAZEM HCL 100 MG IV SOLR
5.0000 mg/h | Freq: Once | INTRAVENOUS | Status: AC
  Administered 2016-09-20: 5 mg/h via INTRAVENOUS
  Filled 2016-09-20: qty 100

## 2016-09-20 MED ORDER — COQ-10 100 MG PO CAPS: 1.0000 | ORAL_CAPSULE | Freq: Every day | ORAL | Status: DC

## 2016-09-20 MED ORDER — SODIUM CHLORIDE 0.9% FLUSH: 3.0000 mL | INTRAVENOUS | Status: DC | PRN

## 2016-09-20 MED ORDER — ACETAMINOPHEN 325 MG PO TABS: 650.0000 mg | ORAL_TABLET | ORAL | Status: DC | PRN

## 2016-09-20 MED ORDER — HEPARIN (PORCINE) IN NACL 100-0.45 UNIT/ML-% IJ SOLN
1400.0000 [IU]/h | INTRAMUSCULAR | Status: DC
  Administered 2016-09-20: 1300 [IU]/h via INTRAVENOUS
  Administered 2016-09-21: 1400 [IU]/h via INTRAVENOUS
  Filled 2016-09-20 (×2): qty 250

## 2016-09-20 MED ORDER — HEPARIN BOLUS VIA INFUSION
4500.0000 [IU] | Freq: Once | INTRAVENOUS | Status: AC
  Administered 2016-09-20: 4500 [IU] via INTRAVENOUS
  Filled 2016-09-20: qty 4500

## 2016-09-20 MED ORDER — ASPIRIN EC 81 MG PO TBEC
81.0000 mg | DELAYED_RELEASE_TABLET | Freq: Every day | ORAL | Status: DC
  Administered 2016-09-21: 81 mg via ORAL
  Filled 2016-09-20: qty 1

## 2016-09-20 MED ORDER — DILTIAZEM HCL 100 MG IV SOLR
10.0000 mg/h | Freq: Once | INTRAVENOUS | Status: AC
  Administered 2016-09-20: 10 mg/h via INTRAVENOUS
  Filled 2016-09-20: qty 100

## 2016-09-20 MED ORDER — TRIAMTERENE-HCTZ 37.5-25 MG PO TABS
2.0000 | ORAL_TABLET | Freq: Every day | ORAL | Status: DC
  Administered 2016-09-21: 2 via ORAL
  Filled 2016-09-20: qty 2

## 2016-09-20 NOTE — ED Notes (Signed)
Dr. Merrell at bedside. 

## 2016-09-20 NOTE — H&P (Deleted)
History and Physical    [name] ZOX:[id number] DOB: Dec 12, 1945 DOA: 09/20/2016   PCP: [name], MD   Attending physician: Konrad Dolores  Patient coming from/Resides with: Private residence  Chief Complaint: Shortness of breath  HPI: [name] is a 71 y.o. male with medical history significant for myasthenia gravis in remission, hypertension, dyslipidemia, GERD. Patient evaluated at PCP office today and found to have tachycardia. EMS called to facility and patient found to be in atrial fibrillation. Patient without awareness of palpitations. Only symptom has been shortness of breath and dyspnea on exertion. No prior history. EDP consulted cardiology due to inability to utilize calcium Channel blockers and beta blockers long-term in setting of myasthenia gravis. Patient is hemodynamically stable except for ongoing tachycardia. BNP mildly elevated at 319. Other labs within normal limits. Await cardiology recommendations for medication to utilize for long-term rate control vs antiarrhythmic medication. Possible TEE-CV.  ED Course:  Vital Signs: BP (!) 132/109   Pulse (!) 107   Temp 98.1 F (36.7 C) (Oral)   Resp 14   Ht 5\' 7"  (1.702 m)   Wt 97.1 kg (214 lb)   SpO2 97%   BMI 33.52 kg/m  2 view CXR: Neg Lab data: Sodium 140, potassium 3.9, chloride 106, CO2 24, glucose 11, BUN 20, creatinine 0.94, anion gap 10, LFTs normal, BNP 319, white count 9800 differential not obtained, hemoglobin 14.6, platelets 193,000 Medications and treatments: Cardizem infusion  Review of Systems:  In addition to the HPI above,  No Fever-chills, myalgias or other constitutional symptoms No Headache, changes with Vision or hearing, new focal weakness, tingling, numbness in any extremity, dizziness, dysarthria or word finding difficulty, gait disturbance or imbalance, tremors or seizure activity No problems swallowing food or Liquids, indigestion/reflux, choking or coughing while eating,  abdominal pain with or after eating No Chest pain, Cough, no awareness of palpitations, no orthopnea  No Abdominal pain, N/V, melena,hematochezia, dark tarry stools, constipation No dysuria, malodorous urine, hematuria or flank pain No new skin rashes, lesions, masses or bruises, No new joint pains, aches, swelling or redness No recent unintentional weight gain or loss No polyuria, polydypsia or polyphagia   Past Medical History:  Diagnosis Date  . GERD (gastroesophageal reflux disease)   . History of Bell's palsy   . History of pericarditis   . Hypertension   . Mixed hyperlipidemia   . Myasthenia gravis (HCC)    currently in remission (11/2014)     Past Surgical History:  Procedure Laterality Date  . LAMINECTOMY  1991  . TONSILLECTOMY  1950    Social History   Social History  . Marital status: Married    Spouse name: N/A  . Number of children: 1  . Years of education: Grad Sch   Occupational History  . [employer]    Social History Main Topics  . Smoking status: Former Smoker    Types: Pipe    Quit date: 12/10/1966  . Smokeless tobacco: Never Used  . Alcohol use 0.6 oz/week    1 Glasses of wine per week  . Drug use: No  . Sexual activity: Not on file   Other Topics Concern  . Not on file   Social History Narrative   Lives at home with his wife.   Right-handed.   Occasional caffeine use.    Mobility: Independent Work history: Not obtained   Allergies  Allergen Reactions  . Demerol [Meperidine]     hallucinations  . Aminoglycosides  Can not use due to mysthenia gravis  . Beta Adrenergic Blockers     Can not use due to mysthenia gravis  . Calcium Channel Blockers     Can not use due to mysthenia gravis  . Ciprofloxacin     Can not use due to mysthenia gravis  . Iodinated Diagnostic Agents     Can not use due to mysthenia gravis  . Penicillamine     Can not use due to mysthenia gravis  . Procainamide Hcl     Can not use due to  mysthenia gravis  . Quinine Derivatives     Can not use due to mysthenia gravis  . Succinylcholine Chloride     Can not use due to mysthenia gravis  . Vecuronium Bromide [Vecuronium]     Can not use due to mysthenia gravis    Family History  Problem Relation Age of Onset  . Cancer Mother   . Heart disease Father   . Cancer Brother        bladder/ in remission  . Healthy Daughter        age 3     Prior to Admission medications   Medication Sig Start Date End Date Taking? Authorizing Provider  Alpha-Lipoic Acid 300 MG CAPS Take 1 capsule by mouth daily.    [provider]  aspirin 81 MG tablet Take 81 mg by mouth daily.    [provider]  atorvastatin (LIPITOR) 20 MG tablet TAKE 1 TABLET(20 MG) BY MOUTH DAILY 09/17/16   Lyn Records, MD  Coenzyme Q10 (COQ-10) 100 MG CAPS Take 1 capsule by mouth daily.     [provider]  Multiple Vitamins-Minerals (CENTRUM SILVER PO) Take 1 tablet by mouth daily.    [provider]  mycophenolate (CELLCEPT) 500 MG tablet Take 2 tablets (1,000 mg total) by mouth 2 (two) times daily. 03/26/16   Levert Feinstein, MD  Omega-3 Fatty Acids (OMEGA-3 PO) Take 1,040 mg by mouth daily.    [provider]  pyridostigmine (MESTINON) 60 MG tablet Take 1 tablet (60 mg total) by mouth 3 (three) times daily as needed. 03/26/16   Levert Feinstein, MD  ramipril (ALTACE) 5 MG capsule Take 5 mg by mouth daily. 11/05/14   [provider]  Resveratrol 100 MG CAPS Take 1 capsule by mouth 2 (two) times daily.    [provider]  tamsulosin (FLOMAX) 0.4 MG CAPS capsule Take 0.4 mg by mouth daily. 09/29/14   [provider]  triamterene-hydrochlorothiazide (MAXZIDE-25) 37.5-25 MG tablet Take 75 tablets by mouth daily. 11/07/14   [provider]  TURMERIC PO Take 100 mg by mouth 2 (two) times daily.    [provider]    Physical Exam: Vitals:   09/20/16 1345 09/20/16 1400 09/20/16 1415 09/20/16  1430  BP: 124/89 121/85 130/76 (!) 132/109  Pulse:  (!) 49 (!) 152 (!) 107  Resp: (!) 31 14 (!) 24 14  Temp:      TempSrc:      SpO2:  97% 96% 97%  Weight:      Height:          Constitutional: NAD, calm, comfortable Eyes: PERRL, lids and conjunctivae normal ENMT: Mucous membranes are moist. Posterior pharynx clear of any exudate or lesions.Normal dentition.  Neck: normal, supple, no masses, no thyromegaly Respiratory: clear to auscultation bilaterally, no wheezing, no crackles. Normal respiratory effort. No accessory muscle use.  Cardiovascular: Irregular with tachycardic rate and rhythm, no  murmurs / rubs / gallops. No extremity edema. 2+ pedal pulses. No carotid bruits. Cardizem infusion as ordered by EDP Abdomen: no tenderness, no masses palpated. No hepatosplenomegaly. Bowel sounds positive.  Musculoskeletal: no clubbing / cyanosis. No joint deformity upper and lower extremities. Good ROM, no contractures. Normal muscle tone.  Skin: no rashes, lesions, ulcers. No induration Neurologic: CN 2-12 grossly intact. Sensation intact, DTR normal. Strength 5/5 x all 4 extremities except for subtle decrease in flexor resistance right upper extremity.  Psychiatric: Normal judgment and insight. Alert and oriented x 3. Normal mood.    Labs on Admission: I have personally reviewed following labs and imaging studies  CBC:  Recent Labs Lab 09/20/16 1326  WBC 9.8  HGB 14.6  HCT 44.4  MCV 93.3  PLT 193   Basic Metabolic Panel:  Recent Labs Lab 09/20/16 1326  NA 140  K 3.9  CL 106  CO2 24  GLUCOSE 101*  BUN 20  CREATININE 0.94  CALCIUM 9.0   GFR: Estimated Creatinine Clearance: 78.9 mL/min (by C-G formula based on SCr of 0.94 mg/dL). Liver Function Tests:  Recent Labs Lab 09/20/16 1326  AST 20  ALT 24  ALKPHOS 69  BILITOT 0.6  PROT 6.4*  ALBUMIN 3.7   No results for input(s): LIPASE, AMYLASE in the last 168 hours. No results for input(s): AMMONIA in the last 168  hours. Coagulation Profile: No results for input(s): INR, PROTIME in the last 168 hours. Cardiac Enzymes: No results for input(s): CKTOTAL, CKMB, CKMBINDEX, TROPONINI in the last 168 hours. BNP (last 3 results) No results for input(s): PROBNP in the last 8760 hours. HbA1C: No results for input(s): HGBA1C in the last 72 hours. CBG: No results for input(s): GLUCAP in the last 168 hours. Lipid Profile: No results for input(s): CHOL, HDL, LDLCALC, TRIG, CHOLHDL, LDLDIRECT in the last 72 hours. Thyroid Function Tests: No results for input(s): TSH, T4TOTAL, FREET4, T3FREE, THYROIDAB in the last 72 hours. Anemia Panel: No results for input(s): VITAMINB12, FOLATE, FERRITIN, TIBC, IRON, RETICCTPCT in the last 72 hours. Urine analysis:    Component Value Date/Time   COLORURINE YELLOW 07/05/2007 2323   APPEARANCEUR CLEAR 07/05/2007 2323   LABSPEC 1.008 07/05/2007 2323   PHURINE 6.5 07/05/2007 2323   GLUCOSEU NEGATIVE 07/05/2007 2323   HGBUR LARGE (A) 07/05/2007 2323   BILIRUBINUR NEGATIVE 07/05/2007 2323   KETONESUR NEGATIVE 07/05/2007 2323   PROTEINUR NEGATIVE 07/05/2007 2323   UROBILINOGEN 0.2 07/05/2007 2323   NITRITE NEGATIVE 07/05/2007 2323   LEUKOCYTESUR SMALL (A) 07/05/2007 2323   Sepsis Labs: @LABRCNTIP (procalcitonin:4,lacticidven:4) )No results found for this or any previous visit (from the past 240 hour(s)).   Radiological Exams on Admission: Dg Chest 2 View  Result Date: 09/20/2016 CLINICAL DATA:  Atrial fibrillation EXAM: CHEST  2 VIEW COMPARISON:  02/20/2011 FINDINGS: Heart size upper normal. Negative for heart failure. Lungs are clear and unchanged from the prior study. IMPRESSION: No active cardiopulmonary disease. Electronically Signed   By: Marlan Palau M.D.   On: 09/20/2016 13:54    EKG: (Independently reviewed) atrial fibrillation with ventricular response 143 bpm, QTC 493 ms, no acute ischemic changes, incomplete right bundle branch  block  Assessment/Plan Principal Internal Medicine Problem:   Myasthenia gravis in remission  -Patient presents with shortness of breath and setting of new onset atrial fibrillation with RVR (see below) -No signs of decompensation regarding MG -Had been in remission since 2008 until mild exacerbation in December 2017 -Primary manifestation of symptoms involves weakness of neck  arms and chest-denies issues with shortness of breath or respiratory symptoms secondary to MG -Continue preadmission CellCept; Patient only utilizes Mestinon when symptomatic  Active Problems:   Hypertension -The pressure currently controlled -At home was prescribed Altace and Maxzide-hold for now until rate controlled    Atrial fibrillation with RVR (primary reason for admission) -Management per cardiology -Continue short-term Cardizem infusion for rate control at discretion of cardiology -Plans are to proceed with TEE-CV in a.m. on 8/10 -CHADVASC = 2 -Cardiology plans to initiate anticoagulationInitially with heparin infusion-agent of choice for long-term anticoagulation has not yet been selected -Was on low-dose aspirin prior to admission    Mixed hyperlipidemia -Continue Lipitor    BPH -Continue Flomax as blood pressure tolerates-of note, the patient inadvertently told his PCP he is no longer taking Flomax but intended a different medication as being discontinued      DVT prophylaxis: Heparin infusion Code Status: Full Family Communication: Wife at bedside Disposition Plan: Home Consults called: Cardiology    Kyndal Heringer L. ANP-BC [facility] Pager [phone]   If 7PM-7AM, please contact night-coverage [email or web address] Password [email or web address]  09/20/2016, 2:52 PM

## 2016-09-20 NOTE — ED Notes (Signed)
Attempted report 

## 2016-09-20 NOTE — H&P (Signed)
Patient ID: [name] MRN: [id number], DOB/AGE: 73-28-1947   Admit date: 09/20/2016  Requesting Physician: Primary Physician: Merri BrunetteSmith, Candace, MD Primary Cardiologist: Dr. Katrinka BlazingSmith Reason for admission: Atrial fibrillation with RVR  HPI:  [name] is a 71 y.o. male with a hx of GERD, hypertension, mixed hyperlipidemia, myasthenia gravis currently in remission. hx of Bell's Palsy, pericarditis. Who is being seen today for the evaluation of atrial fibrillation with RVR at the request of Dr. Estell HarpinZammit.  [name] [facility] new onset atrial fibrillation with RVR rates 140-200. He was at a doctors office visit when it was detected. Per the ER physician he continues to be rate controlled. He has not been significantly symptomatic but has been coughing and somewhat short of breath. He has a significant history for myasthenia gravis and follow with Dr. Jennelle HumanYan [facility].  seropositive generalized myasthenia gravis with recurrent weakness since December 2017.   ER COURSE:  BMP and CBC largely unremarkable, EKG Afib with RVR, Chest xray shows no active cardiopulmonary findings. Diltiazem drip started.   Problem List  Past Medical History:  Diagnosis Date  . GERD (gastroesophageal reflux disease)   . History of Bell's palsy   . History of pericarditis   . Hypertension   . Mixed hyperlipidemia   . Myasthenia gravis (HCC)    currently in remission (11/2014)     Past Surgical History:  Procedure Laterality Date  . LAMINECTOMY  1991  . TONSILLECTOMY  1950     Allergies  Allergies  Allergen Reactions  . Demerol [Meperidine]     hallucinations  . Aminoglycosides     Can not use due to mysthenia gravis  . Beta Adrenergic Blockers     Can not use due to mysthenia gravis  . Calcium Channel Blockers     Can not use due to mysthenia gravis  . Ciprofloxacin     Can not use due to mysthenia  gravis  . Iodinated Diagnostic Agents     Can not use due to mysthenia gravis  . Penicillamine     Can not use due to mysthenia gravis  . Procainamide Hcl     Can not use due to mysthenia gravis  . Quinine Derivatives     Can not use due to mysthenia gravis  . Succinylcholine Chloride     Can not use due to mysthenia gravis  . Vecuronium Bromide [Vecuronium]     Can not use due to mysthenia gravis     Home Medications  Prior to Admission medications   Medication Sig Start Date End Date Taking? Authorizing Provider  Alpha-Lipoic Acid 300 MG CAPS Take 1 capsule by mouth daily.    [provider]  aspirin 81 MG tablet Take 81 mg by mouth daily.    [provider]  atorvastatin (LIPITOR) 20 MG tablet TAKE 1 TABLET(20 MG) BY MOUTH DAILY 09/17/16   Lyn RecordsSmith, Henry W, MD  Coenzyme Q10 (COQ-10) 100 MG CAPS Take 1 capsule by mouth daily.     [provider]  Multiple Vitamins-Minerals (CENTRUM SILVER PO) Take 1 tablet by mouth daily.    [provider]  mycophenolate (CELLCEPT) 500 MG tablet Take 2 tablets (1,000 mg total) by mouth 2 (two) times daily. 03/26/16   Levert FeinsteinYan, Yijun, MD  Omega-3 Fatty Acids (OMEGA-3 PO) Take 1,040 mg by mouth daily.    [provider]  pyridostigmine (MESTINON) 60 MG tablet Take  1 tablet (60 mg total) by mouth 3 (three) times daily as needed. 03/26/16   Levert Feinstein, MD  ramipril (ALTACE) 5 MG capsule Take 5 mg by mouth daily. 11/05/14   [provider]  Resveratrol 100 MG CAPS Take 1 capsule by mouth 2 (two) times daily.    [provider]  tamsulosin (FLOMAX) 0.4 MG CAPS capsule Take 0.4 mg by mouth daily. 09/29/14   [provider]  triamterene-hydrochlorothiazide (MAXZIDE-25) 37.5-25 MG tablet Take 75 tablets by mouth daily. 11/07/14   [provider]  TURMERIC PO Take 100 mg by mouth 2 (two) times daily.    [provider]    Family History  Family History  Problem Relation Age of  Onset  . Cancer Mother   . Heart disease Father   . Cancer Brother        bladder/ in remission  . Healthy Daughter        age 64      Family Status  Relation Status  . Mother Deceased at age 18  . Father Deceased at age 81  . Brother Alive  . Daughter Alive  . MGM Deceased  . MGF Deceased  . PGM Deceased  . PGF Deceased  . Brother (Not Specified)  . Daughter (Not Specified)     Social History  Social History   Social History  . Marital status: Married    Spouse name: N/A  . Number of children: 1  . Years of education: Grad Sch   Occupational History  . [employer]    Social History Main Topics  . Smoking status: Former Smoker    Types: Pipe    Quit date: 12/10/1966  . Smokeless tobacco: Never Used  . Alcohol use 0.6 oz/week    1 Glasses of wine per week  . Drug use: No  . Sexual activity: Not on file   Other Topics Concern  . Not on file   Social History Narrative   Lives at home with his wife.   Right-handed.   Occasional caffeine use.     Review of Systems General:  No chills, fever, night sweats or weight changes.  Cardiovascular:  No chest pain, edema, orthopnea Dermatological: No rash, lesions/masses Respiratory: No respiratory distress. Urologic: No hematuria, dysuria Abdominal:   No nausea, vomiting, diarrhea, bright red blood per rectum, melena, or hematemesis Neurologic:  No visual changes, wkns, changes in mental status. All other systems reviewed and are otherwise negative except as noted above.  Physical Exam  Blood pressure (!) 132/109, pulse (!) 107, temperature 98.1 F (36.7 C), temperature source Oral, resp. rate 14, height 5\' 7"  (1.702 m), weight 214 lb (97.1 kg), SpO2 97 %.  General: Pleasant, NAD Psych: Normal affect. Neuro: Alert and oriented X 3. Moves all extremities spontaneously. HEENT: Normal  Neck: Supple without bruits or JVD. Lungs:  Resp regular and unlabored, CTA. Heart: Irreg Irreg,  no s3, s4, or  murmurs. Abdomen: Soft, non-tender, non-distended, BS + x 4.  Extremities: No clubbing, cyanosis or edema. DP/PT/Radials 2+ and equal bilaterally.  Labs  No results for input(s): CKTOTAL, CKMB, TROPONINI in the last 72 hours. Lab Results  Component Value Date   WBC 9.8 09/20/2016   HGB 14.6 09/20/2016   HCT 44.4 09/20/2016   MCV 93.3 09/20/2016   PLT 193 09/20/2016    Recent Labs Lab 09/20/16 1326  NA 140  K 3.9  CL 106  CO2 24  BUN 20  CREATININE 0.94  CALCIUM 9.0  PROT 6.4*  BILITOT 0.6  ALKPHOS 69  ALT 24  AST 20  GLUCOSE 101*   Lab Results  Component Value Date   CHOL 144 09/13/2015   HDL 56 09/13/2015   LDLCALC 76 09/13/2015   TRIG 60 09/13/2015   No results found for: DDIMER   Radiology/Studies  Dg Chest 2 View  Result Date: 09/20/2016 CLINICAL DATA:  Atrial fibrillation EXAM: CHEST  2 VIEW COMPARISON:  02/20/2011 FINDINGS: Heart size upper normal. Negative for heart failure. Lungs are clear and unchanged from the prior study. IMPRESSION: No active cardiopulmonary disease. Electronically Signed   By: Marlan Palau M.D.   On: 09/20/2016 13:54   Study Highlights   Addendum by Thurmon Fair, MD on Fri Dec 24, 2014 5:09 PM   The left ventricular ejection fraction is normal (55-65%).  Nuclear stress EF: 57%.  There was no ST segment deviation noted during stress.  The study is normal.  This is a low risk study.  Low risk stress nuclear study with normal perfusion and normal left ventricular regional and global systolic function.     ECG  The EKG was personally reviewed and demonstrates:  Atrial fibrillation, heart rates 145  ASSESSMENT AND PLAN  1. Atrial fibrillation with RVR:  TEE cardioversion to be scheduled for tomorrow. CHADSvasc score is 2 (HTN/AGE) -- NPO after midnight  -- heparin to dose per pharmacy order placed -- Will put in admission orders.  2. Myasthenia Gravis: Seropositive generalized myasthenia gravis.  -- Will  consult neurology as needed. -- continue  Mestinon and Cellcept  3. Hypertension: Will continue home meds of Altace and Maxzide   Signed, Dorthula Matas, PA-C 09/20/2016, 2:47 PM   I have seen and examined the patient along with GREENE,TIFFANY G, PA-C.  I have reviewed the chart, notes and new data.  I agree with PA/NP's note.  Key new complaints: unaware of palpitations, but noticed erratic HR readings on BP cuff and more fatigue for about a month. Denies dyspnea or angina. He recalls being told years ago that his left atrium was dilated (echo performed in Dr. Michaelle Copas office as long as 10 years ago - unable to locate a report). He has myasthenia gravis with fair compensation on immunosuppressive agents. This was initially diagnosed in 2005, in remission from 2008-Feb 2018, now requiring Cellcept and pyridostigmine Key examination changes: rapid irregular rhythm without other abnormalities on CV exam. No evidence of muscle weakness at this time Key new findings / data: myasthenia gravis with fair compensation on immunosuppressive agents.  PLAN: He is not well suited for rate control strategy due to potential adverse effects of calcium channel blockers and beta blockers in myasthenia gravis. Prefer early cardioversion. Start anticoagulation with IV heparin since we ill not be able to give three doses of oral anticoagulant by tomorrow, Friday. Scheduled for TEE-DCCV at noon tomorrow. If he fails early cardioversion or has early arrhythmia recurrence, the situation will be quite complicated due to the impact of antiarrhythmics on MG or the treatment for MG. Sodium channel blockers should probably be avoided. Would still need AV blockers if we use dofetilide. Amiodarone and Cellcept compete for p-glycoprotein transporter elimination and raise each others levels. Ablation could be considered if his left atrium is not excessively dilated.  Thurmon Fair, MD, [facility] [phone] 09/20/2016, 2:58 PM

## 2016-09-20 NOTE — ED Provider Notes (Signed)
[facility]-EMERGENCY DEPT Provider Note   CSN: [id number] Arrival date & time: 09/20/16  1316     History   Chief Complaint Chief Complaint  Patient presents with  . Atrial Fibrillation    HPI [name] is a 71 y.o. male.  Patient complains of a mild cough for couple weeks and some shortness of breath. Patient went to his family doctor today and he noticed that his pulse was very rapid. 75 to the emergency department   The history is provided by the patient.  Atrial Fibrillation  This is a new problem. The current episode started more than 1 week ago. The problem occurs constantly. The problem has not changed since onset.Associated symptoms include shortness of breath. Pertinent negatives include no chest pain, no abdominal pain and no headaches. Nothing aggravates the symptoms. Nothing relieves the symptoms. He has tried nothing for the symptoms.    Past Medical History:  Diagnosis Date  . GERD (gastroesophageal reflux disease)   . History of Bell's palsy   . History of pericarditis   . Hypertension   . Mixed hyperlipidemia   . Myasthenia gravis (HCC)    currently in remission (11/2014)     Patient Active Problem List   Diagnosis Date Noted  . Hypertension 09/20/2016  . Mixed hyperlipidemia 09/20/2016  . Myasthenia gravis in remission (HCC) 09/20/2016  . Atrial fibrillation with RVR (HCC) 09/20/2016  . Myasthenia gravis (HCC) 03/26/2016  . Hyperlipidemia 09/12/2015  . Erectile dysfunction 09/12/2015  . History of pericarditis 12/10/2014  . High coronary artery calcium score 12/10/2014  . Essential hypertension 12/10/2014  . Ascending aortic aneurysm (HCC) 12/10/2014    Past Surgical History:  Procedure Laterality Date  . LAMINECTOMY  1991  . TONSILLECTOMY  1950       Home Medications    Prior to Admission medications   Medication Sig Start Date End Date Taking? Authorizing Provider  Alpha-Lipoic Acid 300 MG CAPS Take 1 capsule by mouth daily.     [provider]  aspirin 81 MG tablet Take 81 mg by mouth daily.    [provider]  atorvastatin (LIPITOR) 20 MG tablet TAKE 1 TABLET(20 MG) BY MOUTH DAILY 09/17/16   Lyn RecordsSmith, Henry W, MD  Coenzyme Q10 (COQ-10) 100 MG CAPS Take 1 capsule by mouth daily.     [provider]  Multiple Vitamins-Minerals (CENTRUM SILVER PO) Take 1 tablet by mouth daily.    [provider]  mycophenolate (CELLCEPT) 500 MG tablet Take 2 tablets (1,000 mg total) by mouth 2 (two) times daily. 03/26/16   Levert FeinsteinYan, Yijun, MD  Omega-3 Fatty Acids (OMEGA-3 PO) Take 1,040 mg by mouth daily.    [provider]  pyridostigmine (MESTINON) 60 MG tablet Take 1 tablet (60 mg total) by mouth 3 (three) times daily as needed. 03/26/16   Levert FeinsteinYan, Yijun, MD  ramipril (ALTACE) 5 MG capsule Take 5 mg by mouth daily. 11/05/14   [provider]  Resveratrol 100 MG CAPS Take 1 capsule by mouth 2 (two) times daily.    [provider]  tamsulosin (FLOMAX) 0.4 MG CAPS capsule Take 0.4 mg by mouth daily. 09/29/14   [provider]  triamterene-hydrochlorothiazide (MAXZIDE-25) 37.5-25 MG tablet Take 75 tablets by mouth daily. 11/07/14   [provider]  TURMERIC PO Take 100 mg by mouth 2 (two) times daily.    [provider]    Family History Family History  Problem Relation Age of Onset  . Cancer Mother   .  Heart disease Father   . Cancer Brother        bladder/ in remission  . Healthy Daughter        age 24    Social History Social History  Substance Use Topics  . Smoking status: Former Smoker    Types: Pipe    Quit date: 12/10/1966  . Smokeless tobacco: Never Used  . Alcohol use 0.6 oz/week    1 Glasses of wine per week     Allergies   Demerol [meperidine]; Aminoglycosides; Beta adrenergic blockers; Calcium channel blockers; Ciprofloxacin; Iodinated diagnostic agents; Penicillamine; Procainamide hcl; Quinine derivatives; Succinylcholine chloride; and  Vecuronium bromide [vecuronium]   Review of Systems Review of Systems  Constitutional: Negative for appetite change and fatigue.  HENT: Negative for congestion, ear discharge and sinus pressure.   Eyes: Negative for discharge.  Respiratory: Positive for cough and shortness of breath.   Cardiovascular: Negative for chest pain.  Gastrointestinal: Negative for abdominal pain and diarrhea.  Genitourinary: Negative for frequency and hematuria.  Musculoskeletal: Negative for back pain.  Skin: Negative for rash.  Neurological: Negative for seizures and headaches.  Psychiatric/Behavioral: Negative for hallucinations.     Physical Exam Updated Vital Signs BP 116/90   Pulse 68   Temp 98.1 F (36.7 C) (Oral)   Resp 18   Ht 5\' 7"  (1.702 m)   Wt 97.1 kg (214 lb)   SpO2 97%   BMI 33.52 kg/m   Physical Exam  Constitutional: He is oriented to person, place, and time. He appears well-developed.  HENT:  Head: Normocephalic.  Eyes: Conjunctivae and EOM are normal. No scleral icterus.  Neck: Neck supple. No thyromegaly present.  Cardiovascular: Exam reveals no gallop and no friction rub.   No murmur heard. Rapid irregular heartbeat  Pulmonary/Chest: No stridor. He has no wheezes. He has no rales. He exhibits no tenderness.  Abdominal: He exhibits no distension. There is no tenderness. There is no rebound.  Musculoskeletal: Normal range of motion. He exhibits no edema.  Lymphadenopathy:    He has no cervical adenopathy.  Neurological: He is oriented to person, place, and time. He exhibits normal muscle tone. Coordination normal.  Skin: No rash noted. No erythema.  Psychiatric: He has a normal mood and affect. His behavior is normal.     ED Treatments / Results  Labs (all labs ordered are listed, but only abnormal results are displayed) Labs Reviewed  BASIC METABOLIC PANEL - Abnormal; Notable for the following:       Result Value   Glucose, Bld 101 (*)    All other components  within normal limits  BRAIN NATRIURETIC PEPTIDE - Abnormal; Notable for the following:    B Natriuretic Peptide 319.2 (*)    All other components within normal limits  HEPATIC FUNCTION PANEL - Abnormal; Notable for the following:    Total Protein 6.4 (*)    All other components within normal limits  CBC  PROTIME-INR  I-STAT TROPONIN, ED    EKG  EKG Interpretation None       Radiology Dg Chest 2 View  Result Date: 09/20/2016 CLINICAL DATA:  Atrial fibrillation EXAM: CHEST  2 VIEW COMPARISON:  02/20/2011 FINDINGS: Heart size upper normal. Negative for heart failure. Lungs are clear and unchanged from the prior study. IMPRESSION: No active cardiopulmonary disease. Electronically Signed   By: Marlan Palau M.D.   On: 09/20/2016 13:54    Procedures Procedures (including critical care time)  Medications Ordered in ED Medications  diltiazem (  CARDIZEM) 100 mg in dextrose 5 % 100 mL (1 mg/mL) infusion (not administered)  sodium chloride flush (NS) 0.9 % injection 3 mL (not administered)  sodium chloride flush (NS) 0.9 % injection 3 mL (not administered)  0.9 %  sodium chloride infusion (not administered)  diltiazem (CARDIZEM) injection 10 mg (10 mg Intravenous Given 09/20/16 1413)  diltiazem (CARDIZEM) 100 mg in dextrose 5 % 100 mL (1 mg/mL) infusion (5 mg/hr Intravenous Rate/Dose Change 09/20/16 1446)     Initial Impression / Assessment and Plan / ED Course  I have reviewed the triage vital signs and the nursing notes.  Pertinent labs & imaging results that were available during my care of the patient were reviewed by me and considered in my medical decision making (see chart for details).    CRITICAL CARE Performed by: Yacine Garriga L Total critical care time: 35 minutes Critical care time was exclusive of separately billable procedures and treating other patients. Critical care was necessary to treat or prevent imminent or life-threatening deterioration. Critical care was time  spent personally by me on the following activities: development of treatment plan with patient and/or surrogate as well as nursing, discussions with consultants, evaluation of patient's response to treatment, examination of patient, obtaining history from patient or surrogate, ordering and performing treatments and interventions, ordering and review of laboratory studies, ordering and review of radiographic studies, pulse oximetry and re-evaluation of patient's condition.  Patient will be admitted for new onset rapid atrial fib.  Cardiology will consult   Final Clinical Impressions(s) / ED Diagnoses   Final diagnoses:  Atrial fibrillation with RVR [facility])    New Prescriptions New Prescriptions   No medications on file     Bethann Berkshire, MD 09/20/16 1457

## 2016-09-20 NOTE — Progress Notes (Signed)
ANTICOAGULATION CONSULT NOTE - Initial Consult  Pharmacy Consult for heparin Indication: chest pain/ACS and atrial fibrillation  Allergies  Allergen Reactions  . Demerol [Meperidine]     hallucinations  . Aminoglycosides     Can not use due to mysthenia gravis  . Beta Adrenergic Blockers     Can not use due to mysthenia gravis  . Calcium Channel Blockers     Can not use due to mysthenia gravis  . Ciprofloxacin     Can not use due to mysthenia gravis  . Iodinated Diagnostic Agents     Can not use due to mysthenia gravis  . Penicillamine     Can not use due to mysthenia gravis  . Procainamide Hcl     Can not use due to mysthenia gravis  . Quinine Derivatives     Can not use due to mysthenia gravis  . Succinylcholine Chloride     Can not use due to mysthenia gravis  . Vecuronium Bromide [Vecuronium]     Can not use due to mysthenia gravis    Patient Measurements: Height: 5\' 7"  (170.2 cm) Weight: 214 lb (97.1 kg) IBW/kg (Calculated) : 66.1 Heparin Dosing Weight: 87 kg  Vital Signs: Temp: 98.1 F (36.7 C) (08/09 1328) Temp Source: Oral (08/09 1328) BP: 109/88 (08/09 1500) Pulse Rate: 95 (08/09 1500)  Labs:  Recent Labs  09/20/16 1326  HGB 14.6  HCT 44.4  PLT 193  CREATININE 0.94    Estimated Creatinine Clearance: 78.9 mL/min (by C-G formula based on SCr of 0.94 mg/dL).   Medical History: Past Medical History:  Diagnosis Date  . GERD (gastroesophageal reflux disease)   . History of Bell's palsy   . History of pericarditis   . Hypertension   . Mixed hyperlipidemia   . Myasthenia gravis (HCC)    currently in remission (11/2014)      Assessment: 71 yo male to start heparin gtt for atrial fibrillation. No known a/c PTA. CBC stable.   Goal of Therapy:  Heparin level 0.3-0.7 units/ml Monitor platelets by anticoagulation protocol: Yes   Plan:  1. Give 4500 units bolus x 1 2. Start heparin infusion at 1200 units/hr 3. Check anti-Xa level in 8 hours  and daily while on heparin 4. Continue to monitor H&H and platelets  Pollyann SamplesAndy Nayeli Calvert, PharmD, BCPS 09/20/2016, 3:18 PM

## 2016-09-20 NOTE — ED Triage Notes (Signed)
[facility] with no history. Rate 140-200. Pt has mid SOB no chest pain

## 2016-09-20 NOTE — Progress Notes (Signed)
    [facility] has been requested to perform a transesophageal echocardiogram on [name] for Afib.  AfBecton, Dickinson and Companyter careful review of history and examination, the risks and benefits of transesophageal echocardiogram have been explained including risks of esophageal damage, perforation (1:10,000 risk), bleeding, pharyngeal hematoma as well as other potential complications associated with conscious sedation including aspiration, arrhythmia, respiratory failure and death. Alternatives to treatment were discussed, questions were answered. Patient is willing to proceed.    Pt is scheduled for TEE/DCCV tomorrow at noon with Dr. Royann Shiversroitoru. NPO at [facility] tonight please.   [name], [place]  09/20/2016 3:51 PM

## 2016-09-20 NOTE — Consult Note (Signed)
Consultation note   David Loopicholas A Petrik ZOX:[id number] DOB: 05-24-1945 DOA: 09/20/2016   PCP: Merri BrunetteSmith, Candace, MD   Attending physician: Konrad DoloresMerrell  Requesting physician: Croituro  Reason for consultation: Assist with management of myasthenia gravis and other noncardiac chronic medical problems  HPI: [name] is a 71 y.o. male with medical history significant for myasthenia gravis in remission, hypertension, dyslipidemia, GERD. Patient evaluated at PCP office today and found to have tachycardia. EMS called to facility and patient found to be in atrial fibrillation. Patient without awareness of palpitations. Only symptom has been shortness of breath and dyspnea on exertion. No prior history. EDP consulted cardiology due to inability to utilize calcium Channel blockers and beta blockers long-term in setting of myasthenia gravis. Patient is hemodynamically stable except for ongoing tachycardia. BNP mildly elevated at 319. Other labs within normal limits. Plans are for TEE-CV in a.m. 8/10. In regard to patient's underlying myasthenia gravis, he is stable and without current signs of exacerbation and is clarified that he uses his Mestinon for acute symptoms and not regularly.  ED Course:  Vital Signs: BP (!) 132/109   Pulse (!) 107   Temp 98.1 F (36.7 C) (Oral)   Resp 14   Ht 5\' 7"  (1.702 m)   Wt 97.1 kg (214 lb)   SpO2 97%   BMI 33.52 kg/m  2 view CXR: Neg Lab data: Sodium 140, potassium 3.9, chloride 106, CO2 24, glucose 11, BUN 20, creatinine 0.94, anion gap 10, LFTs normal, BNP 319, white count 9800 differential not obtained, hemoglobin 14.6, platelets 193,000 Medications and treatments: Cardizem infusion  Review of Systems:  In addition to the HPI above,  No Fever-chills, myalgias or other constitutional symptoms No Headache, changes with Vision or hearing, new focal weakness, tingling, numbness in any extremity, dizziness, dysarthria or word finding difficulty, gait  disturbance or imbalance, tremors or seizure activity No problems swallowing food or Liquids, indigestion/reflux, choking or coughing while eating, abdominal pain with or after eating No Chest pain, Cough, no awareness of palpitations, no orthopnea  No Abdominal pain, N/V, melena,hematochezia, dark tarry stools, constipation No dysuria, malodorous urine, hematuria or flank pain No new skin rashes, lesions, masses or bruises, No new joint pains, aches, swelling or redness No recent unintentional weight gain or loss No polyuria, polydypsia or polyphagia       Past Medical History:  Diagnosis Date  . GERD (gastroesophageal reflux disease)   . History of Bell's palsy   . History of pericarditis   . Hypertension   . Mixed hyperlipidemia   . Myasthenia gravis (HCC)    currently in remission (11/2014)     Past Surgical History:  Procedure Laterality Date  . LAMINECTOMY  1991  . TONSILLECTOMY  1950    Social History        Social History  . Marital status: Married    Spouse name: N/A  . Number of children: 1  . Years of education: Grad Sch       Occupational History  . [employer]         Social History Main Topics  . Smoking status: Former Smoker    Types: Pipe    Quit date: 12/10/1966  . Smokeless tobacco: Never Used  . Alcohol use 0.6 oz/week    1 Glasses of wine per week  . Drug use: No  . Sexual activity: Not on file       Other Topics Concern  . Not on file  Social History Narrative   Lives at home with his wife.   Right-handed.   Occasional caffeine use.    Mobility: Independent Work history: Not obtained        Allergies  Allergen Reactions  . Demerol [Meperidine]     hallucinations  . Aminoglycosides     Can not use due to mysthenia gravis  . Beta Adrenergic Blockers     Can not use due to mysthenia gravis  . Calcium Channel Blockers     Can not use due to mysthenia gravis  .  Ciprofloxacin     Can not use due to mysthenia gravis  . Iodinated Diagnostic Agents     Can not use due to mysthenia gravis  . Penicillamine     Can not use due to mysthenia gravis  . Procainamide Hcl     Can not use due to mysthenia gravis  . Quinine Derivatives     Can not use due to mysthenia gravis  . Succinylcholine Chloride     Can not use due to mysthenia gravis  . Vecuronium Bromide [Vecuronium]     Can not use due to mysthenia gravis         Family History  Problem Relation Age of Onset  . Cancer Mother   . Heart disease Father   . Cancer Brother        bladder/ in remission  . Healthy Daughter        age 71            Prior to Admission medications   Medication Sig Start Date End Date Taking? Authorizing Provider  Alpha-Lipoic Acid 300 MG CAPS Take 1 capsule by mouth daily.    [provider]  aspirin 81 MG tablet Take 81 mg by mouth daily.    [provider]  atorvastatin (LIPITOR) 20 MG tablet TAKE 1 TABLET(20 MG) BY MOUTH DAILY 09/17/16   Lyn Records, MD  Coenzyme Q10 (COQ-10) 100 MG CAPS Take 1 capsule by mouth daily.     [provider]  Multiple Vitamins-Minerals (CENTRUM SILVER PO) Take 1 tablet by mouth daily.    [provider]  mycophenolate (CELLCEPT) 500 MG tablet Take 2 tablets (1,000 mg total) by mouth 2 (two) times daily. 03/26/16   Levert Feinstein, MD  Omega-3 Fatty Acids (OMEGA-3 PO) Take 1,040 mg by mouth daily.    [provider]  pyridostigmine (MESTINON) 60 MG tablet Take 1 tablet (60 mg total) by mouth 3 (three) times daily as needed. 03/26/16   Levert Feinstein, MD  ramipril (ALTACE) 5 MG capsule Take 5 mg by mouth daily. 11/05/14   [provider]  Resveratrol 100 MG CAPS Take 1 capsule by mouth 2 (two) times daily.    [provider]  tamsulosin (FLOMAX) 0.4 MG CAPS capsule Take 0.4 mg by mouth daily. 09/29/14   [provider]  triamterene-hydrochlorothiazide (MAXZIDE-25) 37.5-25 MG tablet Take 75 tablets by mouth daily. 11/07/14   [provider]  TURMERIC PO Take 100 mg by mouth 2 (two) times daily.    [provider]    Physical Exam:       Vitals:   09/20/16 1345 09/20/16 1400 09/20/16 1415 09/20/16 1430  BP: 124/89 121/85 130/76 (!) 132/109  Pulse:  (!) 49 (!) 152 (!) 107  Resp: (!) 31 14 (!) 24 14  Temp:      TempSrc:      SpO2:  97% 96% 97%  Weight:      Height:          Constitutional: NAD, calm, comfortable Eyes: PERRL, lids and conjunctivae normal ENMT: Mucous membranes are moist. Posterior pharynx clear of any exudate or lesions.Normal dentition.  Neck: normal, supple, no masses, no thyromegaly Respiratory: clear to auscultation bilaterally, no wheezing, no crackles. Normal respiratory effort. No accessory muscle use.  Cardiovascular: Irregular with tachycardic rate and rhythm, no murmurs / rubs / gallops. No extremity edema. 2+ pedal pulses. No carotid bruits. Cardizem infusion as ordered by EDP Abdomen: no tenderness, no masses palpated. No hepatosplenomegaly. Bowel sounds positive.  Musculoskeletal: no clubbing / cyanosis. No joint deformity upper and lower extremities. Good ROM, no contractures. Normal muscle tone.  Skin: no rashes, lesions, ulcers. No induration Neurologic: CN 2-12 grossly intact. Sensation intact, DTR normal. Strength 5/5 x all 4 extremities except for subtle decrease in flexor resistance right upper extremity.  Psychiatric: Normal judgment and insight. Alert and oriented x 3. Normal mood.    Labs on Admission: I have personally reviewed following labs and imaging studies  CBC:  Last Labs    Recent Labs Lab 09/20/16 1326  WBC 9.8  HGB 14.6  HCT 44.4  MCV 93.3  PLT 193     Basic Metabolic Panel:  Last Labs    Recent Labs Lab 09/20/16 1326  NA 140  K 3.9  CL 106  CO2 24  GLUCOSE 101*  BUN 20   CREATININE 0.94  CALCIUM 9.0     GFR: Estimated Creatinine Clearance: 78.9 mL/min (by C-G formula based on SCr of 0.94 mg/dL). Liver Function Tests:  Last Labs    Recent Labs Lab 09/20/16 1326  AST 20  ALT 24  ALKPHOS 69  BILITOT 0.6  PROT 6.4*  ALBUMIN 3.7     Last Labs   No results for input(s): LIPASE, AMYLASE in the last 168 hours.   Last Labs   No results for input(s): AMMONIA in the last 168 hours.   Coagulation Profile: Last Labs   No results for input(s): INR, PROTIME in the last 168 hours.   Cardiac Enzymes: Last Labs   No results for input(s): CKTOTAL, CKMB, CKMBINDEX, TROPONINI in the last 168 hours.   BNP (last 3 results) Recent Labs (within last 365 days)  No results for input(s): PROBNP in the last 8760 hours.   HbA1C: Recent Labs (last 2 labs)   No results for input(s): HGBA1C in the last 72 hours.   CBG: Last Labs   No results for input(s): GLUCAP in the last 168 hours.   Lipid Profile: Recent Labs (last 2 labs)   No results for input(s): CHOL, HDL, LDLCALC, TRIG, CHOLHDL, LDLDIRECT in the last 72 hours.   Thyroid Function Tests: Recent Labs (last 2 labs)   No results for input(s): TSH, T4TOTAL, FREET4, T3FREE, THYROIDAB in the last 72 hours.   Anemia Panel: Recent Labs (last 2 labs)   No results for input(s): VITAMINB12, FOLATE, FERRITIN, TIBC, IRON, RETICCTPCT in the last 72 hours.   Urine analysis: Labs (Brief)          Component Value Date/Time   COLORURINE YELLOW 07/05/2007 2323   APPEARANCEUR CLEAR 07/05/2007 2323   LABSPEC 1.008 07/05/2007 2323   PHURINE 6.5 07/05/2007 2323   GLUCOSEU NEGATIVE 07/05/2007 2323   HGBUR LARGE (A) 07/05/2007 2323   BILIRUBINUR NEGATIVE 07/05/2007 2323   KETONESUR NEGATIVE 07/05/2007 2323   PROTEINUR NEGATIVE 07/05/2007 2323   UROBILINOGEN 0.2 07/05/2007 2323  NITRITE NEGATIVE 07/05/2007 2323   LEUKOCYTESUR SMALL (A) 07/05/2007 2323     Sepsis  Labs: @LABRCNTIP (procalcitonin:4,lacticidven:4) )No results found for this or any previous visit (from the past 240 hour(s)).   Radiological Exams on Admission:  Imaging Results (Last 48 hours)  Dg Chest 2 View  Result Date: 09/20/2016 CLINICAL DATA:  Atrial fibrillation EXAM: CHEST  2 VIEW COMPARISON:  02/20/2011 FINDINGS: Heart size upper normal. Negative for heart failure. Lungs are clear and unchanged from the prior study. IMPRESSION: No active cardiopulmonary disease. Electronically Signed   By: Marlan Palau M.D.   On: 09/20/2016 13:54     EKG: (Independently reviewed) atrial fibrillation with ventricular response 143 bpm, QTC 493 ms, no acute ischemic changes, incomplete right bundle branch block  Assessment/Plan Principal Internal Medicine Problem:   Myasthenia gravis in remission  -Patient presents with shortness of breath in the setting of new onset atrial fibrillation with RVR (see below)-we have been asked to follow the patient regarding management of his myasthenia gravis and other chronic noncardiac medical problems -No signs of decompensation regarding MG -Had been in remission since 2008 until mild exacerbation in December 2017 -Primary manifestation of symptoms involves weakness of neck arms and chest-denies issues with shortness of breath or respiratory symptoms secondary to MG -Continue preadmission CellCept; Patient only utilizes Mestinon when symptomatic  Active Problems:   Hypertension -Blood pressure currently controlled -Management of antihypertensive medications per cardiology    Atrial fibrillation with RVR (primary reason for admission) -Management per cardiology -To continue short-term Cardizem infusion for rate control at discretion of cardiology -Plans are to proceed with TEE-CV in a.m. on 8/10 -CHADVASC = 2 -Cardiology has begun heparin infusion -agent of choice for long-term anticoagulation has not yet been selected -Was on low-dose aspirin  prior to admission    Mixed hyperlipidemia -Continue Lipitor    BPH -Continue Flomax as blood pressure tolerates-of note, the patient inadvertently told his PCP he is no longer taking Flomax but intended a different medication as being discontinued      DVT prophylaxis: Heparin infusion Code Status: Full Family Communication: Wife at bedside Disposition Plan: Home    Anden Bartolo L. ANP-BC [facility] Pager [phone]   If 7PM-7AM, please contact night-coverage [email or web address] Password [email or web address]  09/20/2016, 3:40 PM

## 2016-09-20 NOTE — ED Notes (Signed)
Allison NP at bedside  

## 2016-09-21 ENCOUNTER — Inpatient Hospital Stay (HOSPITAL_BASED_OUTPATIENT_CLINIC_OR_DEPARTMENT_OTHER): Payer: Medicare Other

## 2016-09-21 ENCOUNTER — Encounter: Payer: Self-pay | Admitting: Neurology

## 2016-09-21 ENCOUNTER — Other Ambulatory Visit: Payer: Self-pay

## 2016-09-21 ENCOUNTER — Telehealth: Payer: Self-pay | Admitting: Nurse Practitioner

## 2016-09-21 ENCOUNTER — Encounter (HOSPITAL_COMMUNITY)
Admission: EM | Disposition: A | Discharge status: Home / Self Care | Payer: Self-pay | Source: Home / Self Care | Attending: Emergency Medicine

## 2016-09-21 ENCOUNTER — Inpatient Hospital Stay (HOSPITAL_COMMUNITY): Payer: Medicare Other | Admitting: Anesthesiology

## 2016-09-21 ENCOUNTER — Encounter (HOSPITAL_COMMUNITY): Payer: Self-pay

## 2016-09-21 DIAGNOSIS — I10 Essential (primary) hypertension: Secondary | ICD-10-CM

## 2016-09-21 DIAGNOSIS — I340 Nonrheumatic mitral (valve) insufficiency: Secondary | ICD-10-CM

## 2016-09-21 DIAGNOSIS — I481 Persistent atrial fibrillation: Secondary | ICD-10-CM | POA: Diagnosis not present

## 2016-09-21 DIAGNOSIS — I4819 Other persistent atrial fibrillation: Secondary | ICD-10-CM

## 2016-09-21 DIAGNOSIS — G7000 Myasthenia gravis without (acute) exacerbation: Secondary | ICD-10-CM | POA: Diagnosis not present

## 2016-09-21 DIAGNOSIS — I4891 Unspecified atrial fibrillation: Secondary | ICD-10-CM | POA: Diagnosis not present

## 2016-09-21 DIAGNOSIS — K219 Gastro-esophageal reflux disease without esophagitis: Secondary | ICD-10-CM | POA: Diagnosis not present

## 2016-09-21 DIAGNOSIS — E782 Mixed hyperlipidemia: Secondary | ICD-10-CM | POA: Diagnosis not present

## 2016-09-21 HISTORY — PX: CARDIOVERSION: SHX1299

## 2016-09-21 HISTORY — PX: TEE WITHOUT CARDIOVERSION: SHX5443

## 2016-09-21 LAB — CBC
HEMATOCRIT: 41.3 % (ref 39.0–52.0)
HEMOGLOBIN: 13.7 g/dL (ref 13.0–17.0)
MCH: 30.6 pg (ref 26.0–34.0)
MCHC: 33.2 g/dL (ref 30.0–36.0)
MCV: 92.4 fL (ref 78.0–100.0)
Platelets: 171 10*3/uL (ref 150–400)
RBC: 4.47 MIL/uL (ref 4.22–5.81)
RDW: 13.7 % (ref 11.5–15.5)
WBC: 6.5 10*3/uL (ref 4.0–10.5)

## 2016-09-21 LAB — HEPARIN LEVEL (UNFRACTIONATED)
Heparin Unfractionated: 0.31 IU/mL (ref 0.30–0.70)
Heparin Unfractionated: 0.45 IU/mL (ref 0.30–0.70)

## 2016-09-21 SURGERY — ECHOCARDIOGRAM, TRANSESOPHAGEAL
Anesthesia: Monitor Anesthesia Care

## 2016-09-21 MED ORDER — DILTIAZEM HCL 30 MG PO TABS: 30.0000 mg | ORAL_TABLET | Freq: Four times a day (QID) | ORAL | Status: DC | PRN

## 2016-09-21 MED ORDER — APIXABAN 5 MG PO TABS: 5.0000 mg | ORAL_TABLET | Freq: Two times a day (BID) | ORAL | 3 refills | Status: DC

## 2016-09-21 MED ORDER — PROPOFOL 10 MG/ML IV BOLUS
INTRAVENOUS | Status: DC | PRN
  Administered 2016-09-21: 10 mg via INTRAVENOUS
  Administered 2016-09-21: 20 mg via INTRAVENOUS

## 2016-09-21 MED ORDER — PROPOFOL 500 MG/50ML IV EMUL
INTRAVENOUS | Status: DC | PRN
  Administered 2016-09-21: 100 ug/kg/min via INTRAVENOUS

## 2016-09-21 MED ORDER — APIXABAN 5 MG PO TABS
5.0000 mg | ORAL_TABLET | Freq: Two times a day (BID) | ORAL | Status: DC
  Administered 2016-09-21: 5 mg via ORAL
  Filled 2016-09-21: qty 1

## 2016-09-21 MED ORDER — DILTIAZEM HCL 30 MG PO TABS: 30.0000 mg | ORAL_TABLET | Freq: Four times a day (QID) | ORAL | 1 refills | Status: DC | PRN

## 2016-09-21 MED ORDER — TRIAMTERENE-HCTZ 37.5-25 MG PO TABS: 2.0000 | ORAL_TABLET | Freq: Every day | ORAL | 3 refills | Status: DC

## 2016-09-21 MED ORDER — MYCOPHENOLATE MOFETIL 250 MG PO CAPS
1000.0000 mg | ORAL_CAPSULE | Freq: Two times a day (BID) | ORAL | Status: DC
  Administered 2016-09-21: 1000 mg via ORAL
  Filled 2016-09-21: qty 4

## 2016-09-21 MED ORDER — PHENYLEPHRINE HCL 10 MG/ML IJ SOLN
INTRAMUSCULAR | Status: DC | PRN
  Administered 2016-09-21: 120 ug via INTRAVENOUS
  Administered 2016-09-21: 80 ug via INTRAVENOUS

## 2016-09-21 MED ORDER — BUTAMBEN-TETRACAINE-BENZOCAINE 2-2-14 % EX AERO
INHALATION_SPRAY | CUTANEOUS | Status: DC | PRN
  Administered 2016-09-21: 2 via TOPICAL

## 2016-09-21 MED ORDER — MYCOPHENOLATE MOFETIL 500 MG PO TABS: 1000.0000 mg | ORAL_TABLET | Freq: Two times a day (BID) | ORAL | Status: DC

## 2016-09-21 MED ORDER — DILTIAZEM HCL 100 MG IV SOLR
5.0000 mg/h | INTRAVENOUS | Status: DC
  Administered 2016-09-21: 5 mg/h via INTRAVENOUS
  Filled 2016-09-21 (×3): qty 100

## 2016-09-21 NOTE — Progress Notes (Signed)
Progress Note  Patient Name: [name] Date of Encounter: 09/21/2016  Primary Cardiologist: Katrinka BlazingSmith  Subjective   He decent rate control overnight on intravenous diltiazem, but the medication was then stopped and he again developed rapid ventricular response. While receiving intravenous diltiazem there was no change in his muscle strength that he could detect. Had successful cardioversion after TEE did not show left atrial thrombus. However, the TEE also shows a severely dilated left atrium. Left atrial appendage velocities were normal. Mitral insufficiency was present, but only mild-moderate with a central jet and unlikely to have a causal relationship with either the left atrial enlargement were the arrhythmia.  Inpatient Medications    Scheduled Meds: . [MAR Hold] aspirin EC  81 mg Oral Daily  . [MAR Hold] atorvastatin  20 mg Oral q1800  . [MAR Hold] mycophenolate  1,000 mg Oral BID  . [MAR Hold] ramipril  5 mg Oral Daily  . [MAR Hold] sodium chloride flush  3 mL Intravenous Q12H  . [MAR Hold] tamsulosin  0.4 mg Oral Daily  . [MAR Hold] triamterene-hydrochlorothiazide  2 tablet Oral Daily   Continuous Infusions: . sodium chloride 20 mL/hr at 09/20/16 2032  . sodium chloride Stopped (09/20/16 1817)  . [MAR Hold] diltiazem (CARDIZEM) infusion Stopped (09/21/16 1232)   PRN Meds: [MAR Hold] acetaminophen, [MAR Hold] ondansetron (ZOFRAN) IV, [MAR Hold] pyridostigmine, [MAR Hold] sodium chloride flush   Vital Signs    Vitals:   09/21/16 1117 09/21/16 1246 09/21/16 1250 09/21/16 1300  BP: (!) 149/81 (!) 84/60 93/69 102/68  Pulse: (!) 133 64 64 64  Resp: 19 20 19 16   Temp: 97.8 F (36.6 C) 98 F (36.7 C)    TempSrc: Oral Oral    SpO2: 96% 95% 97% 95%  Weight: 216 lb (98 kg)     Height: 5\' 7"  (1.702 m)       Intake/Output Summary (Last 24 hours) at 09/21/16 1301 Last data filed at 09/21/16 1247  Gross per 24 hour  Intake          1164.95 ml  Output               900 ml  Net           264.95 ml   Filed Weights   09/20/16 1840 09/21/16 0446 09/21/16 1117  Weight: 217 lb 14.4 oz (98.8 kg) 216 lb 9.6 oz (98.2 kg) 216 lb (98 kg)    Telemetry    Atrial fibrillation with rapid ventricular response - Personally Reviewed  ECG    After cardioversion shows normal sinus rhythm with PACs, minor incomplete right bundle branch block pattern, QTC 457 ms - Personally Reviewed  Physical Exam  Relaxed and comfortable GEN: No acute distress.   Neck: No JVD Cardiac: RRR with occasional ectopy, no murmurs, rubs, or gallops.  Respiratory: Clear to auscultation bilaterally. GI: Soft, nontender, non-distended  MS: No edema; No deformity. Neuro:  Nonfocal  Psych: Normal affect   Labs    Chemistry Recent Labs Lab 09/20/16 1326  NA 140  K 3.9  CL 106  CO2 24  GLUCOSE 101*  BUN 20  CREATININE 0.94  CALCIUM 9.0  PROT 6.4*  ALBUMIN 3.7  AST 20  ALT 24  ALKPHOS 69  BILITOT 0.6  GFRNONAA >60  GFRAA >60  ANIONGAP 10     Hematology Recent Labs Lab 09/20/16 1326 09/21/16 0946  WBC 9.8 6.5  RBC 4.76 4.47  HGB 14.6 13.7  HCT 44.4  41.3  MCV 93.3 92.4  MCH 30.7 30.6  MCHC 32.9 33.2  RDW 13.6 13.7  PLT 193 171    Cardiac EnzymesNo results for input(s): TROPONINI in the last 168 hours. No results for input(s): TROPIPOC in the last 168 hours.   BNP Recent Labs Lab 09/20/16 1353  BNP 319.2*     DDimer No results for input(s): DDIMER in the last 168 hours.   Radiology    Dg Chest 2 View  Result Date: 09/20/2016 CLINICAL DATA:  Atrial fibrillation EXAM: CHEST  2 VIEW COMPARISON:  02/20/2011 FINDINGS: Heart size upper normal. Negative for heart failure. Lungs are clear and unchanged from the prior study. IMPRESSION: No active cardiopulmonary disease. Electronically Signed   By: Marlan Palau M.D.   On: 09/20/2016 13:54    Cardiac Studies   TEE shows severely dilated left atrium, mild/moderate mitral regurgitation, borderline left  ventricular ejection fraction 45-50% (difficult to evaluate with certainty due to rapid irregular rhythm), dilation of the right atrium as well.  Converted to sinus rhythm with a single 120 J synchronized shock  Patient Profile     71 y.o. male with newly diagnosed persistent atrial fibrillation, severe left atrial enlargement, chronic treated hypertension, myasthenia gravis in remission on treatment with cholinesterase inhibitors and immunosuppression.  Assessment & Plan    1. AFib: We'll discharge home on Eliquis 5 mg twice daily which should not be interrupted for any reason in the next 30 days, other than life-threatening bleeding complications. Due to severe left atrial dilatation, there is a high likelihood of arrhythmia recurrence. There are few rate control drugs that are categorically safe to give this patient with myasthenia gravis. I think it's better not to give him a daily prescription. I will give him a prescription for diltiazem immediate release 60 mg 3 times daily to take as needed whenever he has breakthrough atrial fibrillation with rapid response. Unfortunately he is not aware of the palpitations, but develops weakness with the arrhythmia. I suggested that it is reasonable for him to purchase a commercially available rhythm monitoring device such as Science writer. He is a highly educated former Administrator, arts and I think he would be able to use that device appropriately.  I also believe that it's worth making an early referral to electrophysiology to discuss alternative plans for management. He is not an optimal ablation candidate because of the marked dilation of the left atrium, but this may be the safest choice for recurrent atrial fibrillation, compared to antiarrhythmic drugs. He should probably never be exposed to class 1/sodium channel blockers. Conceivably, dofetilide should be a safer choice for him, but he does have a borderline prolonged QTc interval at 457 ms  2. HTN:  It's possible that remote poorly treated/untreated hypertension may be responsible for his left atrial enlargement. Current blood pressure control is excellent. Continue same medications.  3. Myasthenia gravis: Since he was unaware of palpitations, he blamed this disorder for the weakness associated with his atrial fibrillation with rapid ventricular response. This may be an issue in the future as well, which is why I think a personally available rhythm monitoring device would be very useful. When prescribing treatment for his atrial fibrillation the potentially deleterious effects of many cardiac medications on his muscle disorder should be considered.  All these considerations were discussed in detail with the patient and his wife. Plan to discharge later this afternoon. He already has an appointment with Norma Fredrickson on August 21.  Signed, Thurmon Fair, MD  09/21/2016, 1:01 PM

## 2016-09-21 NOTE — Progress Notes (Signed)
ANTICOAGULATION CONSULT NOTE - Initial Consult  Pharmacy Consult for Apixaban Indication: atrial fibrillation  Allergies  Allergen Reactions  . Demerol [Meperidine]     hallucinations  . Aminoglycosides     Can not use due to mysthenia gravis  . Beta Adrenergic Blockers     Can not use due to mysthenia gravis  . Calcium Channel Blockers     Can not use due to mysthenia gravis  . Ciprofloxacin     Can not use due to mysthenia gravis  . Iodinated Diagnostic Agents     Can not use due to mysthenia gravis  . Penicillamine     Can not use due to mysthenia gravis  . Procainamide Hcl     Can not use due to mysthenia gravis  . Quinine Derivatives     Can not use due to mysthenia gravis  . Succinylcholine Chloride     Can not use due to mysthenia gravis  . Vecuronium Bromide [Vecuronium]     Can not use due to mysthenia gravis    Patient Measurements: Height: 5\' 7"  (170.2 cm) Weight: 216 lb (98 kg) IBW/kg (Calculated) : 66.1   Vital Signs: Temp: 98 F (36.7 C) (08/10 1246) Temp Source: Oral (08/10 1246) BP: 130/51 (08/10 1320) Pulse Rate: 69 (08/10 1320)  Labs:  Recent Labs  09/20/16 1326 09/21/16 0121 09/21/16 0946  HGB 14.6  --  13.7  HCT 44.4  --  41.3  PLT 193  --  171  LABPROT 14.4  --   --   INR 1.11  --   --   HEPARINUNFRC  --  0.31 0.45  CREATININE 0.94  --   --     Estimated Creatinine Clearance: 79.3 mL/min (by C-G formula based on SCr of 0.94 mg/dL).   Medical History: Past Medical History:  Diagnosis Date  . GERD (gastroesophageal reflux disease)   . History of Bell's palsy   . History of pericarditis   . Hypertension   . Mixed hyperlipidemia   . Myasthenia gravis (HCC)    currently in remission (11/2014)     Medications:  Scheduled:  . apixaban  5 mg Oral BID  . aspirin EC  81 mg Oral Daily  . atorvastatin  20 mg Oral q1800  . mycophenolate  1,000 mg Oral BID  . ramipril  5 mg Oral Daily  . sodium chloride flush  3 mL Intravenous  Q12H  . tamsulosin  0.4 mg Oral Daily  . triamterene-hydrochlorothiazide  2 tablet Oral Daily    Assessment: 71yo male with AFib, to transition from heparin to Apixaban.  Heparin level was therapeutic this AM and Hg, pltc wnl.  No bleeding.  TEE (-)thrombus.  Goal of Therapy:  prevention of stroke due to AFib Monitor platelets by anticoagulation protocol: Yes   Plan:  D/C heparin Apixaban 5mg  bid   Alvester MorinKendra Josephmichael Lisenbee, B.S., PharmD Clinical Pharmacist [facility] [place]

## 2016-09-21 NOTE — Progress Notes (Signed)
[facility]  PROGRESS NOTE  [name]:[id number] DOB: 11-19-1945 DOA: 09/20/2016 PCP: [name], MD   Brief HPI:    71 y.o. male who presents with New onset Afib in RVR. [facility] has admitted the patient. [facility] was consulted for assistance in general medical management.   Subjective   Patient seen and examined, denies muscle weakness. He has been started on IV heparin for new onset A. fib with RVR.   Assessment/Plan:     1. New onset atrial fibrillation with RVR- patient on IV Cardizem, heparin protocol per pharmacy. Cardiology following. Plan for TEE on Monday. 2. Myasthenia gravis- no  muscle weakness, continue CellCept 1 g twice a day. Pyridostigmine 60 mg 3 times a day when necessary. 3. BPH-continue Flomax. Patient was on tolterodine which was discontinued. 4. Hypertension-blood pressure is controlled. Continue ramipril 5. Hyperlipidemia- continue Lipitor.         Continuous infusions . sodium chloride Stopped (09/20/16 1817)      Antibiotics:   Anti-infectives    None       Objective   Vitals:   09/21/16 1300 09/21/16 1305 09/21/16 1308 09/21/16 1320  BP: 102/68 103/68 115/75 (!) 130/51  Pulse: 64 65 68 69  Resp: 16 16 15 15   Temp:      TempSrc:      SpO2: 96% 95% 95% 95%  Weight:      Height:        Intake/Output Summary (Last 24 hours) at 09/21/16 1759 Last data filed at 09/21/16 1247  Gross per 24 hour  Intake          1161.95 ml  Output              900 ml  Net           261.95 ml   Filed Weights   09/20/16 1840 09/21/16 0446 09/21/16 1117  Weight: 98.8 kg (217 lb 14.4 oz) 98.2 kg (216 lb 9.6 oz) 98 kg (216 lb)     Physical Examination:   Physical Exam: Eyes: No icterus, extraocular muscles intact  Mouth: Oral mucosa is moist, no lesions on palate,  Neck: Supple, no deformities, masses, or tenderness Lungs: Normal respiratory effort, bilateral clear to auscultation, no crackles or wheezes.  Heart:  Irregular rhythm, S1 and S2 normal, no murmurs, rubs auscultated Abdomen: BS normoactive,soft,nondistended,non-tender to palpation,no organomegaly Extremities: No pretibial edema, no erythema, no cyanosis, no clubbing Neuro : Alert and oriented to time, place and person, No focal deficits  Skin: No rashes seen on exam     Data Reviewed: I have personally reviewed following labs and imaging studies  CBG: No results for input(s): GLUCAP in the last 168 hours.  CBC:  Recent Labs Lab 09/20/16 1326 09/21/16 0946  WBC 9.8 6.5  HGB 14.6 13.7  HCT 44.4 41.3  MCV 93.3 92.4  PLT 193 171    Basic Metabolic Panel:  Recent Labs Lab 09/20/16 1326  NA 140  K 3.9  CL 106  CO2 24  GLUCOSE 101*  BUN 20  CREATININE 0.94  CALCIUM 9.0    No results found for this or any previous visit (from the past 240 hour(s)).   Liver Function Tests:  Recent Labs Lab 09/20/16 1326  AST 20  ALT 24  ALKPHOS 69  BILITOT 0.6  PROT 6.4*  ALBUMIN 3.7    Cardiac Enzymes: No results for input(s): CKTOTAL, CKMB, CKMBINDEX, TROPONINI in the last 168 hours. BNP (last 3 results)  Recent Labs  09/20/16 1353  BNP 319.2*    ProBNP (last 3 results) No results for input(s): PROBNP in the last 8760 hours.    Studies: Dg Chest 2 View  Result Date: 09/20/2016 CLINICAL DATA:  Atrial fibrillation EXAM: CHEST  2 VIEW COMPARISON:  02/20/2011 FINDINGS: Heart size upper normal. Negative for heart failure. Lungs are clear and unchanged from the prior study. IMPRESSION: No active cardiopulmonary disease. Electronically Signed   By: Marlan Palauharles  Clark M.D.   On: 09/20/2016 13:54    Scheduled Meds: . apixaban  5 mg Oral BID  . aspirin EC  81 mg Oral Daily  . atorvastatin  20 mg Oral q1800  . mycophenolate  1,000 mg Oral BID  . ramipril  5 mg Oral Daily  . sodium chloride flush  3 mL Intravenous Q12H  . tamsulosin  0.4 mg Oral Daily  . triamterene-hydrochlorothiazide  2 tablet Oral Daily       Time spent: 25 min  [facility] - [place],Dmario Russom S   [facility] Pager [phone]. If 7PM-7AM, please contact night-coverage at [email or web address], Office  [phone]  password [email or web address]  09/21/2016, 5:59 PM  LOS: 1 day

## 2016-09-21 NOTE — Progress Notes (Signed)
  Echocardiogram Echocardiogram Transesophageal has been performed.  Janalyn HarderWest, Galo Sayed R 09/21/2016, 12:44 PM

## 2016-09-21 NOTE — Telephone Encounter (Signed)
1st TCM call attempt. LMTCB. 

## 2016-09-21 NOTE — Care Management Obs Status (Signed)
MEDICARE OBSERVATION STATUS NOTIFICATION   Patient Details  Name: [name] A Kipp MRN: [id number] Date of Birth: 13-Sep-1945   Medicare Observation Status Notification Given:  Yes    Elliot CousinShavis, Leshawn Houseworth Ellen, RN 09/21/2016, 2:13 PM

## 2016-09-21 NOTE — Care Management CC44 (Signed)
Condition Code 44 Documentation Completed  Patient Details  Name: Debroah Loopicholas A Leete MRN: [id number] Date of Birth: Oct 26, 1945   Condition Code 44 given:  Yes Patient signature on Condition Code 44 notice:  Yes Documentation of 2 MD's agreement:  Yes Code 44 added to claim:  Yes    Elliot CousinShavis, Dwight Burdo Ellen, RN 09/21/2016, 2:15 PM

## 2016-09-21 NOTE — Discharge Summary (Signed)
Discharge Summary    Patient ID: [name],  MRN: [id number], DOB/AGE: [birth year] 71 y.o.  Admit date: 09/20/2016 Discharge date: 09/21/2016   Primary Care Provider: Merri [name] Primary Cardiologist: Dr. Katrinka [name]  Discharge Diagnoses    Principal Problem:   Myasthenia gravis in remission [facility]) Active Problems:   Hypertension   Mixed hyperlipidemia   Atrial fibrillation with RVR (HCC)   Persistent atrial fibrillation (HCC)   Atrial fibrillation (HCC)   Allergies Allergies  Allergen Reactions  . Demerol [Meperidine]     hallucinations  . Aminoglycosides     Can not use due to mysthenia gravis  . Beta Adrenergic Blockers     Can not use due to mysthenia gravis  . Calcium Channel Blockers     Can not use due to mysthenia gravis  . Ciprofloxacin     Can not use due to mysthenia gravis  . Iodinated Diagnostic Agents     Can not use due to mysthenia gravis  . Penicillamine     Can not use due to mysthenia gravis  . Procainamide Hcl     Can not use due to mysthenia gravis  . Quinine Derivatives     Can not use due to mysthenia gravis  . Succinylcholine Chloride     Can not use due to mysthenia gravis  . Vecuronium Bromide [Vecuronium]     Can not use due to mysthenia gravis     History of Present Illness     [name] a 70 y.o.malewith a hx of GERD, hypertension, mixed hyperlipidemia, myasthenia gravis currently in remission. hx of Bell's Palsy, pericarditis. Who is being seen [name] for the evaluation of atrial fibrillation with RVRat the request of Dr. Estell [name].  [name] to the emergency department by [facility] for new onset atrial fibrillation with RVR rates 140-200. He was at a doctors office visit when it was detected. Per the ER physician he continues to be rate controlled. He has not been significantly symptomatic but has been coughing and somewhat short of breath. He has a significant history for myasthenia  gravis and follow with [name]. He is seropositive generalized myasthenia gravis withrecurrent weakness since December 2017.   ER COURSE: BMP and CBC largely unremarkable, EKG Afib with RVR, Chest xray shows no active cardiopulmonary findings. Diltiazem drip started.   Hospital Course     Consultants: Medicine   Given is MG, beta blockers and Ca channel blockers should be used with caution. He responded to diltiazem drip for rate control overnight. He underwent TEE/DCCV 09/21/16 with [name] with successful cardioversion to NSR. He will be discharged on eliquis and immediate release diltiazem to take only as needed if he has rapid heart rate. ASA was D/C'ed at discharge.   Patient seen and examined by [name] [name] and was stable for discharge. All follow up has been arranged.  _____________  Discharge Vitals Blood pressure (!) 130/51, pulse 69, temperature 98 F (36.7 C), temperature source Oral, resp. rate 15, height 5\' 7"  (1.702 m), weight 216 lb (98 kg), SpO2 95 %.  Filed Weights   09/20/16 1840 09/21/16 0446 09/21/16 1117  Weight: 217 lb 14.4 oz (98.8 kg) 216 lb 9.6 oz (98.2 kg) 216 lb (98 kg)   Physical Exam  Constitutional: He is oriented to person, place, and time. He appears well-developed and well-nourished. No distress.  HENT:  Head: Normocephalic and atraumatic.  Neck: Normal range of motion. Neck supple.  No JVD present.  Cardiovascular: Normal rate, regular rhythm, normal heart sounds and intact distal pulses.   Pulmonary/Chest: Effort normal and breath sounds normal. No respiratory distress.  Abdominal: Soft. Bowel sounds are normal.  Musculoskeletal: Normal range of motion. He exhibits no edema.  Neurological: He is alert and oriented to person, place, and time.  Skin: Skin is warm and dry.    Labs & Radiologic Studies    CBC  Recent Labs  09/20/16 1326 09/21/16 0946  WBC 9.8 6.5  HGB 14.6 13.7  HCT 44.4 41.3  MCV  93.3 92.4  PLT 193 171   Basic Metabolic Panel  Recent Labs  09/20/16 1326  NA 140  K 3.9  CL 106  CO2 24  GLUCOSE 101*  BUN 20  CREATININE 0.94  CALCIUM 9.0   Liver Function Tests  Recent Labs  09/20/16 1326  AST 20  ALT 24  ALKPHOS 69  BILITOT 0.6  PROT 6.4*  ALBUMIN 3.7   No results for input(s): LIPASE, AMYLASE in the last 72 hours. Cardiac Enzymes No results for input(s): CKTOTAL, CKMB, CKMBINDEX, TROPONINI in the last 72 hours. BNP Invalid input(s): POCBNP D-Dimer No results for input(s): DDIMER in the last 72 hours. Hemoglobin A1C No results for input(s): HGBA1C in the last 72 hours. Fasting Lipid Panel No results for input(s): CHOL, HDL, LDLCALC, TRIG, CHOLHDL, LDLDIRECT in the last 72 hours. Thyroid Function Tests No results for input(s): TSH, T4TOTAL, T3FREE, THYROIDAB in the last 72 hours.  Invalid input(s): FREET3 _____________  Dg Chest 2 View  Result Date: 09/20/2016 CLINICAL DATA:  Atrial fibrillation EXAM: CHEST  2 VIEW COMPARISON:  02/20/2011 FINDINGS: Heart size upper normal. Negative for heart failure. Lungs are clear and unchanged from the prior study. IMPRESSION: No active cardiopulmonary disease. Electronically Signed   By: David Palauharles  [name] M.D.   On: 09/20/2016 13:54     Diagnostic Studies/Procedures    TEE 09/21/16: No LA thrombus. Severely dilated left atrium, but normal appendage velocities. Right atrium also dilated. Hard to assess LVEF due to rapid irregular rhythm, approx 45-50% without regional abnormalities. Mild-moderate mitral insufficiency.  DCCV 09/21/16: Findings: Post procedure EKG shows: NSR Complications: None Patient did tolerate procedure well.  Myoview 12/23/14:  The left ventricular ejection fraction is normal (55-65%).  Nuclear stress EF: 57%.  There was no ST segment deviation noted during stress.  The study is normal.  This is a low risk study.   Low risk stress nuclear study with normal perfusion and  normal left ventricular regional and global systolic function.   Disposition   Pt is being discharged home [name] in good condition.  Follow-up Plans & Appointments    Follow-up Information    Rosalio MacadamiaGerhardt, Lori C, NP Follow up on 10/02/2016.   Specialties:  Nurse Practitioner, Interventional Cardiology, Cardiology, Radiology Why:  2:00pm TCM Contact information: [address] [place] [address] [phone]          Discharge Instructions    Diet - low sodium heart healthy    Complete by:  As directed    Increase activity slowly    Complete by:  As directed       Discharge Medications   Current Discharge Medication List    START taking these medications   Details  apixaban (ELIQUIS) 5 MG TABS tablet Take 1 tablet (5 mg total) by mouth 2 (two) times daily. Qty: 60 tablet, Refills: 3    diltiazem (CARDIZEM) 30 MG tablet Take 1  tablet (30 mg total) by mouth every 6 (six) hours as needed (rapid heart rate over 120 bpm). Qty: 120 tablet, Refills: 1      CONTINUE these medications which have CHANGED   Details  triamterene-hydrochlorothiazide (MAXZIDE-25) 37.5-25 MG tablet Take 2 tablets by mouth daily. Qty: 60 tablet, Refills: 3      CONTINUE these medications which have NOT CHANGED   Details  Alpha-Lipoic Acid 300 MG CAPS Take 300 mg by mouth daily.     atorvastatin (LIPITOR) 20 MG tablet TAKE 1 TABLET(20 MG) BY MOUTH DAILY Qty: 30 tablet, Refills: 0    Coenzyme Q10 (COQ-10) 100 MG CAPS Take 100 mg by mouth daily.     Multiple Vitamins-Minerals (CENTRUM SILVER PO) Take 1 tablet by mouth daily.    mycophenolate (CELLCEPT) 500 MG tablet Take 2 tablets (1,000 mg total) by mouth 2 (two) times daily. Qty: 120 tablet, Refills: 12    Omega-3 Fatty Acids (OMEGA-3 PO) Take 1,040 mg by mouth daily.    pyridostigmine (MESTINON) 60 MG tablet Take 1 tablet (60 mg total) by mouth 3 (three) times daily as needed. Qty: 90 tablet, Refills: 6    ramipril  (ALTACE) 5 MG capsule Take 5 mg by mouth daily.    Resveratrol 100 MG CAPS Take 100 mg by mouth 2 (two) times daily.     tamsulosin (FLOMAX) 0.4 MG CAPS capsule Take 0.4 mg by mouth daily.    TURMERIC PO Take 100 mg by mouth 2 (two) times daily.      STOP taking these medications     aspirin 81 MG tablet           Outstanding Labs/Studies   Needs referral to EP OP  Duration of Discharge Encounter   Greater than 30 minutes including physician time.  Signed, Roe Rutherford Duke PA-C 09/21/2016, 2:12 PM

## 2016-09-21 NOTE — Interval H&P Note (Signed)
History and Physical Interval Note:  09/21/2016 9:46 AM  [name]  has presented today for surgery, with the diagnosis of Atrial Fibrillation  The various methods of treatment have been discussed with the patient and family. After consideration of risks, benefits and other options for treatment, the patient has consented to  Procedure(s): TRANSESOPHAGEAL ECHOCARDIOGRAM (TEE) (N/A) CARDIOVERSION (N/A) as a surgical intervention .  The patient's history has been reviewed, patient examined, no change in status, stable for surgery.  I have reviewed the patient's chart and labs.  Questions were answered to the patient's satisfaction.     [name]

## 2016-09-21 NOTE — Progress Notes (Signed)
ANTICOAGULATION CONSULT NOTE - Follow Up Consult  Pharmacy Consult for heparin Indication: atrial fibrillation  Labs:  Recent Labs  09/20/16 1326 09/21/16 0121  HGB 14.6  --   HCT 44.4  --   PLT 193  --   LABPROT 14.4  --   INR 1.11  --   HEPARINUNFRC  --  0.31  CREATININE 0.94  --     Assessment: 71yo male therapeutic on heparin with initial dosing for Afib though at very low end of goal.  Goal of Therapy:  Heparin level 0.3-0.7 units/ml   Plan:  Will increase heparin gtt slightly to 1400 units/hr and check level in 6hr.  Vernard GamblesVeronda Megham Dwyer, PharmD, BCPS  09/21/2016,2:48 AM

## 2016-09-21 NOTE — Op Note (Signed)
Procedure: Electrical Cardioversion Indications:  Atrial Fibrillation  Procedure Details:  Consent: Risks of procedure as well as the alternatives and risks of each were explained to the (patient/caregiver).  Consent for procedure obtained.  Time Out: Verified patient identification, verified procedure, site/side was marked, verified correct patient position, special equipment/implants available, medications/allergies/relevent history reviewed, required imaging and test results available.  Performed  Patient placed on cardiac monitor, pulse oximetry, supplemental oxygen as necessary.  Sedation given: IV propofol, Anesthesiology Pacer pads placed anterior and posterior chest.  Cardioverted 1 time(s).  Cardioversion with synchronized biphasic 120J shock.  Evaluation: Findings: Post procedure EKG shows: NSR Complications: None Patient did tolerate procedure well.  Time Spent Directly with the Patient:  30 minutes   Dionel Archey 09/21/2016, 12:39 PM

## 2016-09-21 NOTE — Discharge Instructions (Addendum)
Information on my medicine - ELIQUIS (apixaban)  This medication education was reviewed with me or my healthcare representative as part of my discharge preparation.  The pharmacist that spoke with me during my hospital stay was:  Jaquita Folds, [facility]  Why was Eliquis prescribed for you? Eliquis was prescribed for you to reduce the risk of a blood clot forming that can cause a stroke if you have a medical condition called atrial fibrillation (a type of irregular heartbeat).  What do You need to know about Eliquis ? Take your Eliquis TWICE DAILY - one tablet in the morning and one tablet in the evening with or without food. If you have difficulty swallowing the tablet whole please discuss with your pharmacist how to take the medication safely.  Take Eliquis exactly as prescribed by your doctor and DO NOT stop taking Eliquis without talking to the doctor who prescribed the medication.  Stopping may increase your risk of developing a stroke.  Refill your prescription before you run out.  After discharge, you should have regular check-up appointments with your healthcare provider that is prescribing your Eliquis.  In the future your dose may need to be changed if your kidney function or weight changes by a significant amount or as you get older.  What do you do if you miss a dose? If you miss a dose, take it as soon as you remember on the same day and resume taking twice daily.  Do not take more than one dose of ELIQUIS at the same time to make up a missed dose.  Important Safety Information A possible side effect of Eliquis is bleeding. You should call your healthcare provider right away if you experience any of the following: ? Bleeding from an injury or your nose that does not stop. ? Unusual colored urine (red or dark brown) or unusual colored stools (red or black). ? Unusual bruising for unknown reasons. ? A serious fall or if you hit your head (even if there is no bleeding).  Some  medicines may interact with Eliquis and might increase your risk of bleeding or clotting while on Eliquis. To help avoid this, consult your healthcare provider or pharmacist prior to using any new prescription or non-prescription medications, including herbals, vitamins, non-steroidal anti-inflammatory drugs (NSAIDs) and supplements.  This website has more information on Eliquis (apixaban): http://www.eliquis.com/eliquis/home  Atrial Fibrillation Atrial fibrillation is a type of heartbeat that is irregular or fast (rapid). If you have this condition, your heart keeps quivering in a weird (chaotic) way. This condition can make it so your heart cannot pump blood normally. Having this condition gives a person more risk for stroke, heart failure, and other heart problems. There are different types of atrial fibrillation. Talk with your doctor to learn about the type that you have. Follow these instructions at home:  Take over-the-counter and prescription medicines only as told by your doctor.  If your doctor prescribed a blood-thinning medicine, take it exactly as told. Taking too much of it can cause bleeding. If you do not take enough of it, you will not have the protection that you need against stroke and other problems.  Do not use any tobacco products. These include cigarettes, chewing tobacco, and e-cigarettes. If you need help quitting, ask your doctor.  If you have apnea (obstructive sleep apnea), manage it as told by your doctor.  Do not drink alcohol.  Do not drink beverages that have caffeine. These include coffee, soda, and tea.  Maintain a  healthy weight. Do not use diet pills unless your doctor says they are safe for you. Diet pills may make heart problems worse.  Follow diet instructions as told by your doctor.  Exercise regularly as told by your doctor.  Keep all follow-up visits as told by your doctor. This is important. Contact a doctor if:  You notice a change in the  speed, rhythm, or strength of your heartbeat.  You are taking a blood-thinning medicine and you notice more bruising.  You get tired more easily when you move or exercise. Get help right away if:  You have pain in your chest or your belly (abdomen).  You have sweating or weakness.  You feel sick to your stomach (nauseous).  You notice blood in your throw up (vomit), poop (stool), or pee (urine).  You are short of breath.  You suddenly have swollen feet and ankles.  You feel dizzy.  Your suddenly get weak or numb in your face, arms, or legs, especially if it happens on one side of your body.  You have trouble talking, trouble understanding, or both.  Your face or your eyelid droops on one side. These symptoms may be an emergency. Do not wait to see if the symptoms will go away. Get medical help right away. Call your local emergency services (911 in the [place] Do not drive yourself to the hospital. This information is not intended to replace advice given to you by your health care provider. Make sure you discuss any questions you have with your health care provider. Document Released: 11/08/2007 Document Revised: 07/07/2015 Document Reviewed: 05/26/2014 Elsevier Interactive Patient Education  [employer]2018 [employer] Inc.

## 2016-09-21 NOTE — Progress Notes (Signed)
Pt ambulated post procedure without any difficulty or issues. I reviewed d/c instructions with patient and no further questions. PT discharged via wheelchair to private vehicle

## 2016-09-21 NOTE — Anesthesia Preprocedure Evaluation (Addendum)
Anesthesia Evaluation  Patient identified by MRN, date of birth, ID band Patient awake    Reviewed: Allergy & Precautions, NPO status , Patient's Chart, lab work & pertinent test results  Airway Mallampati: II  TM Distance: >3 FB Neck ROM: Full    Dental no notable dental hx.    Pulmonary neg pulmonary ROS, former smoker,    Pulmonary exam normal breath sounds clear to auscultation       Cardiovascular hypertension, + CAD  negative cardio ROS Normal cardiovascular exam+ dysrhythmias Atrial Fibrillation  Rhythm:Regular Rate:Normal     Neuro/Psych  Neuromuscular disease negative psych ROS   GI/Hepatic negative GI ROS, Neg liver ROS, GERD  ,  Endo/Other  negative endocrine ROS  Renal/GU negative Renal ROS  negative genitourinary   Musculoskeletal negative musculoskeletal ROS (+)   Abdominal   Peds negative pediatric ROS (+)  Hematology negative hematology ROS (+)   Anesthesia Other Findings Myasthenia Gravis  Reproductive/Obstetrics negative OB ROS                            Anesthesia Physical Anesthesia Plan  ASA: III  Anesthesia Plan: MAC   Post-op Pain Management:    Induction: Intravenous  PONV Risk Score and Plan: 1 and Ondansetron and Treatment may vary due to age or medical condition  Airway Management Planned: Nasal Cannula  Additional Equipment:   Intra-op Plan:   Post-operative Plan:   Informed Consent: I have reviewed the patients History and Physical, chart, labs and discussed the procedure including the risks, benefits and alternatives for the proposed anesthesia with the patient or authorized representative who has indicated his/her understanding and acceptance.   Dental advisory given  Plan Discussed with: CRNA  Anesthesia Plan Comments:         Anesthesia Quick Evaluation

## 2016-09-21 NOTE — Transfer of Care (Signed)
Immediate Anesthesia Transfer of Care Note  Patient: [name]  Procedure(s) Performed: Procedure(s): TRANSESOPHAGEAL ECHOCARDIOGRAM (TEE) (N/A) CARDIOVERSION (N/A)  Patient Location: Endoscopy Unit  Anesthesia Type:MAC  Level of Consciousness: awake, alert , oriented and patient cooperative  Airway & Oxygen Therapy: Patient Spontanous Breathing and Patient connected to nasal cannula oxygen  Post-op Assessment: Report given to RN and Post -op Vital signs reviewed and stable  Post vital signs: Reviewed and stable  Last Vitals:  Vitals:   09/21/16 1117 09/21/16 1246  BP: (!) 149/81 (!) 84/60  Pulse: (!) 133 64  Resp: 19 20  Temp: 36.6 C   SpO2: 96% 95%    Last Pain:  Vitals:   09/21/16 1117  TempSrc: Oral  PainSc:          Complications: No apparent anesthesia complications

## 2016-09-21 NOTE — Anesthesia Postprocedure Evaluation (Signed)
Anesthesia Post Note  Patient: [name]  Procedure(s) Performed: Procedure(s) (LRB): TRANSESOPHAGEAL ECHOCARDIOGRAM (TEE) (N/A) CARDIOVERSION (N/A)     Patient location during evaluation: PACU Anesthesia Type: MAC Level of consciousness: awake and alert Pain management: pain level controlled Vital Signs Assessment: post-procedure vital signs reviewed and stable Respiratory status: spontaneous breathing, nonlabored ventilation and respiratory function stable Cardiovascular status: blood pressure returned to baseline and stable Postop Assessment: no signs of nausea or vomiting Anesthetic complications: no    Last Vitals:  Vitals:   09/21/16 1308 09/21/16 1320  BP: 115/75 (!) 130/51  Pulse: 68 69  Resp: 15 15  Temp:    SpO2: 95% 95%    Last Pain:  Vitals:   09/21/16 1246  TempSrc: Oral  PainSc:                  Lynda Rainwater

## 2016-09-21 NOTE — Op Note (Signed)
INDICATIONS: atrial fibrillation  PROCEDURE:   Informed consent was obtained prior to the procedure. The risks, benefits and alternatives for the procedure were discussed and the patient comprehended these risks.  Risks include, but are not limited to, cough, sore throat, vomiting, nausea, somnolence, esophageal and stomach trauma or perforation, bleeding, low blood pressure, aspiration, pneumonia, infection, trauma to the teeth and death.    After a procedural time-out, the oropharynx was anesthetized with 20% benzocaine spray.   Sedation with IV propofol per Anesthesiology, Dr. Hyacinth MeekerMiller.  The transesophageal probe was inserted in the esophagus and stomach without difficulty and multiple views were obtained.  The patient was kept under observation until the patient left the procedure room.  The patient left the procedure room in stable condition.   Agitated microbubble saline contrast was not administered.  COMPLICATIONS:    There were no immediate complications.  FINDINGS:  No LA thrombus. Severely dilated left atrium, but normal appendage velocities. Right atrium also dilated. Hard to assess LVEF due to rapid irregular rhythm, approx 45-50% without regional abnormalities. Mild-moderate mitral insufficiency.  RECOMMENDATIONS:     Proceed with DCCV. High likelihood of AFib recurrence, refer for EP consult.  Time Spent Directly with the Patient:  30 minutes   [name] 09/21/2016, 12:36 PM

## 2016-09-21 NOTE — Care Management Note (Signed)
Case Management Note  Patient Details  Name: Debroah Loopicholas A Hevener MRN: [id number] Date of Birth: 11/30/1945  Subjective/Objective:   afib with RVR                 Action/Plan: Discharge Planning: NCM spoke to pt. Provided pt with Eliquis 30 day free trial card. Contacted [facility] and they do have in stock. Pt's copay will be $33. Wife at home to assist with care.   PCP Merri BrunetteSMITH, CANDACE MD  Expected Discharge Date:  09/21/16               Expected Discharge Plan:  Home/Self Care  In-House Referral:  NA  Discharge planning Services  CM Consult, Medication Assistance  Post Acute Care Choice:  NA Choice offered to:  NA  DME Arranged:  N/A DME Agency:  NA  HH Arranged:  NA HH Agency:  NA  Status of Service:  Completed, signed off  If discussed at Long Length of Stay Meetings, dates discussed:    Additional Comments:  Elliot CousinShavis, Gabino Hagin Ellen, RN 09/21/2016, 3:54 PM

## 2016-09-21 NOTE — Telephone Encounter (Signed)
New message      TCM appt made on 10-02-16 at 2pm with Norma FredricksonLori Gerhardt per Coralee NorthNina.

## 2016-09-23 ENCOUNTER — Encounter (HOSPITAL_COMMUNITY): Payer: Self-pay | Admitting: Cardiovascular Disease

## 2016-09-24 ENCOUNTER — Ambulatory Visit (INDEPENDENT_AMBULATORY_CARE_PROVIDER_SITE_OTHER): Payer: Medicare Other | Admitting: Interventional Cardiology

## 2016-09-24 ENCOUNTER — Encounter: Payer: Self-pay | Admitting: Interventional Cardiology

## 2016-09-24 VITALS — BP 100/66 | HR 139 | Ht 67.0 in | Wt 217.0 lb

## 2016-09-24 DIAGNOSIS — Z7901 Long term (current) use of anticoagulants: Secondary | ICD-10-CM | POA: Diagnosis not present

## 2016-09-24 DIAGNOSIS — Z8669 Personal history of other diseases of the nervous system and sense organs: Secondary | ICD-10-CM | POA: Diagnosis not present

## 2016-09-24 DIAGNOSIS — I10 Essential (primary) hypertension: Secondary | ICD-10-CM | POA: Diagnosis not present

## 2016-09-24 DIAGNOSIS — I4891 Unspecified atrial fibrillation: Secondary | ICD-10-CM | POA: Diagnosis not present

## 2016-09-24 MED ORDER — AMIODARONE HCL 200 MG PO TABS: 200.0000 mg | ORAL_TABLET | Freq: Two times a day (BID) | ORAL | 3 refills | Status: DC

## 2016-09-24 NOTE — Progress Notes (Signed)
Cardiology Office Note    Date:  09/24/2016   ID:  [name], DOB February 13, 1946, MRN [id number]  PCP:  [name], MD  Cardiologist: Lesleigh Noe, MD   Chief Complaint  Patient presents with  . Atrial Fibrillation    History of Present Illness:  [name] is a 71 y.o. male history of myasthenia gravis, recently admitted with atrial fibrillation and underwent electrical cardioversion to sinus rhythm within the last 72 hours. Called this morning because heart rate was elevated again and he is concerned there is recurrent atrial fibrillation.  The patient was able to walk into the office today. He has noted a rapid heart rate for the past 24 hours. He is using diltiazem immediate release. Blood pressures are relatively low. He denies dizziness. After cardioversion, he felt that he could breathe more easily and freely. Now back in atrial fibrillation he feels the way he did for several weeks prior to the cardioversion. He denies chest pain. He denies edema. He has not been dizzy or lightheaded.  Past Medical History:  Diagnosis Date  . GERD (gastroesophageal reflux disease)   . History of Bell's palsy   . History of pericarditis   . Hypertension   . Mixed hyperlipidemia   . Myasthenia gravis (HCC)    currently in remission (11/2014)     Past Surgical History:  Procedure Laterality Date  . CARDIOVERSION N/A 09/21/2016   Procedure: CARDIOVERSION;  Surgeon: Thurmon Fair, MD;  Location: [facility] END[facility]COPY;  Service: Cardiovascular;  Laterality: N/A;  . LAMINECTOMY  1991  . TEE WITHOUT CARDIOVERSION N/A 09/21/2016   Procedure: TRANSESOPHAGEAL ECHOCARDIOGRAM (TEE);  Surgeon: Thurmon Fair, MD;  Location[facility] ENDOSCOPY;  Service: Cardiovascular;  Laterality: N/A;  . TONSILLECTOMY  1950    Current Medications: Outpatient Medications Prior to Visit  Medication Sig Dispense Refill  . Alpha-Lipoic Acid 300 MG CAPS Take 300 mg by mouth daily.     Marland Kitchen apixaban  (ELIQUIS) 5 MG TABS tablet Take 1 tablet (5 mg total) by mouth 2 (two) times daily. 60 tablet 3  . atorvastatin (LIPITOR) 20 MG tablet TAKE 1 TABLET(20 MG) BY MOUTH DAILY 30 tablet 0  . Coenzyme Q10 (COQ-10) 100 MG CAPS Take 100 mg by mouth daily.     . Multiple Vitamins-Minerals (CENTRUM SILVER PO) Take 1 tablet by mouth daily.    . mycophenolate (CELLCEPT) 500 MG tablet Take 2 tablets (1,000 mg total) by mouth 2 (two) times daily. 120 tablet 12  . Omega-3 Fatty Acids (OMEGA-3 PO) Take 1,040 mg by mouth daily.    Marland Kitchen pyridostigmine (MESTINON) 60 MG tablet Take 1 tablet (60 mg total) by mouth 3 (three) times daily as needed. 90 tablet 6  . ramipril (ALTACE) 5 MG capsule Take 5 mg by mouth daily.    Marland Kitchen Resveratrol 100 MG CAPS Take 100 mg by mouth 2 (two) times daily.     . tamsulosin (FLOMAX) 0.4 MG CAPS capsule Take 0.4 mg by mouth daily.    Marland Kitchen triamterene-hydrochlorothiazide (MAXZIDE-25) 37.5-25 MG tablet Take 2 tablets by mouth daily. 60 tablet 3  . TURMERIC PO Take 100 mg by mouth 2 (two) times daily.    Marland Kitchen diltiazem (CARDIZEM) 30 MG tablet Take 1 tablet (30 mg total) by mouth every 6 (six) hours as needed (rapid heart rate over 120 bpm). 120 tablet 1   No facility-administered medications prior to visit.      Allergies:   Demerol [meperidine]; Aminoglycosides; Beta adrenergic blockers;  Calcium channel blockers; Ciprofloxacin; Iodinated diagnostic agents; Penicillamine; Procainamide hcl; Quinine derivatives; Succinylcholine chloride; and Vecuronium bromide [vecuronium]   Social History   Social History  . Marital status: Married    Spouse name: N/A  . Number of children: 1  . Years of education: Grad Sch   Occupational History  . [employer]    Social History Main Topics  . Smoking status: Former Smoker    Types: Pipe    Quit date: 12/10/1966  . Smokeless tobacco: Never Used  . Alcohol use 0.6 oz/week    1 Glasses of wine per week  . Drug use: No  . Sexual activity: Not  Asked   Other Topics Concern  . None   Social History Narrative   Lives at home with his wife.   Right-handed.   Occasional caffeine use.     Family History:  The patient's family history includes Cancer in his brother and mother; Healthy in his daughter; Heart disease in his father.   ROS:   Please see the history of present illness.    Dry hacking cough, rapid irregular heartbeat. History of myasthenia gravis. Increasingly active over the past year. He is on CellCept and physostigmine (as needed).  All other systems reviewed and are negative.   PHYSICAL EXAM:   VS:  BP 100/66 (BP Location: Left Arm)   Pulse (!) 139   Ht 5\' 7"  (1.702 m)   Wt 217 lb (98.4 kg)   BMI 33.99 kg/m    GEN: Well nourished, well developed, in no acute distress . Bearded. HEENT: normal  Neck: no JVD, carotid bruits, or masses Cardiac: rapid and IIR; no murmurs, rubs, or gallops,no edema . Respiratory:  clear to auscultation bilaterally, normal work of breathing GI: soft, nontender, nondistended, + BS MS: no deformity or atrophy  Skin: warm and dry, no rash Neuro:  Alert and Oriented x 3, Strength and sensation are intact Psych: euthymic mood, full affect  Wt Readings from Last 3 Encounters:  09/24/16 217 lb (98.4 kg)  09/21/16 216 lb (98 kg)  05/23/16 216 lb 4 oz (98.1 kg)      Studies/Labs Reviewed:   EKG:  EKG  Atrial fib with rapid ventricular response.. Incomplete right bundle branch block. When compared to the electrocardiogram performed on 10/22/2016, atrial fibrillation and increased heart rate or new  Recent Labs: 03/26/2016: TSH 1.170 09/20/2016: ALT 24; B Natriuretic Peptide 319.2; BUN 20; Creatinine, Ser 0.94; Potassium 3.9; Sodium 140 09/21/2016: Hemoglobin 13.7; Platelets 171   Lipid Panel    Component Value Date/Time   CHOL 144 09/13/2015 1041   CHOL 218 (H) 12/14/2014 0915   TRIG 60 09/13/2015 1041   TRIG 77 12/14/2014 0915   HDL 56 09/13/2015 1041   HDL 52 12/14/2014  0915   CHOLHDL 2.6 09/13/2015 1041   VLDL 12 09/13/2015 1041   LDLCALC 76 09/13/2015 1041   LDLCALC 151 (H) 12/14/2014 0915    Additional studies/ records that were reviewed today include:  Transesophageal echocardiogram 09/21/2016: Study Conclusions  - Left ventricle: The cavity size was normal. Wall thickness was   normal. Systolic function was at the lower limits of normal. The   estimated ejection fraction was in the range of 50% to 55%. Wall   motion was normal; there were no regional wall motion   abnormalities. No evidence of thrombus. - Aortic valve: No evidence of vegetation. No evidence of   vegetation. There was trivial regurgitation. - Mitral valve: No evidence of  vegetation. There was mild to   moderate regurgitation directed centrally. - Left atrium: The atrium was severely dilated. No evidence of   thrombus in the atrial cavity or appendage. No spontaneous echo   contrast was observed. - Right atrium: The atrium was dilated. - Atrial septum: No defect or patent foramen ovale was identified. - Tricuspid valve: No evidence of vegetation. No evidence of   vegetation. - Pulmonic valve: No evidence of vegetation.  Impressions:  - Successful cardioversion.   ASSESSMENT:    1. Atrial fibrillation with rapid ventricular response (HCC)   2. Essential hypertension, benign   3. Chronic anticoagulation   4. History of myasthenia gravis      PLAN:  In order of problems listed above:  1. Within 24 hours after discharge from the hospital on 09/21/2016, the patient noted recurrence of rapid heartbeat and relatively low blood pressures. Heart rates in the 130 to 150 range were noted. He has been taking diltiazem 30 mg of immediate release preparation to slow heart rate without benefit. Plan to start amiodarone. Discussed this with Pharm.D., Raquel Rodriguez-Guzman who noted minimal likelihood that amiodarone would cause difficulty with Myasthenia gravis. I have  discontinued diltiazem as it is lowering the blood pressure. Amiodarone 200 mg twice daily is started. Follow-up in one week. Earlier if symptoms worsen. Ultimate plan is elective cardioversion in 3-4 weeks. 2. Monitor for evidence of blood pressure lowering less than 90 mmHg systolic. We have encouraged patient to measure his blood pressure twice daily. Also to record heart rates. 3. Continue Eliquis. 4. Monitor for aggravation of Myasthenia gravis on antiarrhythmic therapy.  Prolonged office visit with greater than 50% of the time spent in counseling and research concerning appropriate antiarrhythmic therapy in the setting of Myasthenia gravis to avoid aggravation of the syndrome.  Medication Adjustments/Labs and Tests Ordered: Current medicines are reviewed at length with the patient today.  Concerns regarding medicines are outlined above.  Medication changes, Labs and Tests ordered today are listed in the Patient Instructions below. Patient Instructions  Medication Instructions:  1) STOP Diltiazem 2) START Amiodarone 200mg - one tablet by mouth twice daily.  Labwork: None  Testing/Procedures: None  Follow-Up: Your physician recommends that you schedule a follow-up appointment in: 1 week with Dr. Katrinka BlazingSmith with an EKG (Can have 10:40AM on 10/04/16)   Any Other Special Instructions Will Be Listed Below (If Applicable).     If you need a refill on your cardiac medications before your next appointment, please call your pharmacy.      Signed, Lesleigh NoeHenry W Preeti Winegardner III, MD  09/24/2016 5:58 PM    [facility] [address], [place], [place]  [id number] Phone: [phone]; Fax: [phone]

## 2016-09-24 NOTE — Patient Instructions (Addendum)
Medication Instructions:  1) STOP Diltiazem 2) START Amiodarone 200mg - one tablet by mouth twice daily.  Labwork: None  Testing/Procedures: None  Follow-Up: Your physician recommends that you schedule a follow-up appointment in: 1 week with Dr. Katrinka BlazingSmith with an EKG (Can have 10:40AM on 10/04/16)   Any Other Special Instructions Will Be Listed Below (If Applicable).     If you need a refill on your cardiac medications before your next appointment, please call your pharmacy.

## 2016-09-24 NOTE — Telephone Encounter (Signed)
[facility].  Patient understands to follow up with provider Dr Katrinka BlazingSmith on today (8/13) at 3:20 at [facility]. Patient understands discharge instructions? yes Patient understands medications and regiment? yes Patient understands to bring all medications to this visit? yes  The pt returned my call this morning. He c/o elevated HR of 155 bpm last night so he took a Diltiazem as directed. At 6 am he checked his HR and it was elevated at 150 bpm so he took another Diltiazem dose. He states that he attempted to check his HR manually but that his HR"was all over the place".  Per pt his HR at this time is 138 bpm. He states that his Timex HR monitor alerted him of his elevated HR's.  I asked him to check his HR/BP with his wrist cuff, he did and his HR was 74 bpm but his BP was 88/64. He denies dizziness, SOB, CP, headaches, etc. The pt is concerned as he states that he went to his PCP last Thursday for a cough and was found to be in A-fib with a HR of 150 bpm and was sent straight to the hospital and was admitted.   I s/w Dr Katrinka BlazingSmith who is our DOD today and is also the pts cardiologist. Per Dr Katrinka BlazingSmith the pt has been given an appt to see him today at 3:20 and has been advised to take his Diltiazem 30 mg every 4 hours.   The pt verbalized understanding and is in agreement with plan. He thanked me for my assistance and expressed great appreciation towards Dr Katrinka BlazingSmith.

## 2016-09-27 ENCOUNTER — Telehealth: Payer: Self-pay | Admitting: Neurology

## 2016-09-27 NOTE — Telephone Encounter (Signed)
Update diagnosis code he is still taking CellCept for his myasthenia gravis,  I delete the diagnostic code of myasthenia gravis in remission

## 2016-10-02 ENCOUNTER — Ambulatory Visit: Payer: Medicare Other | Admitting: Nurse Practitioner

## 2016-10-03 ENCOUNTER — Encounter: Payer: Self-pay | Admitting: Interventional Cardiology

## 2016-10-03 NOTE — Progress Notes (Signed)
Cardiology Office Note    Date:  10/04/2016   ID:  [name], DOB Feb 26, 1945, MRN [id number]  PCP:  Merri BrunetteSmith, Candace, MD  Cardiologist: Lesleigh NoeHenry W Rayne Loiseau III, MD   Chief Complaint  Patient presents with  . Atrial Fibrillation    History of Present Illness:  [name] is a 71 y.o. male history of myasthenia gravis, recently admitted with atrial fibrillation and underwent electrical cardioversion to sinus rhythm within the last 72 hours. Called this morning because heart rate was elevated again and he is concerned there is recurrent atrial fibrillation. Recurrent AF being treated with amiodarone loading and eventual repeated electrical cardioversion.  TEE guided electrical cardioversion was performed 09/21/2016. Within 72 hours he had relapsed to atrial fibrillation with rapid ventricular response. Tolerating amiodarone reasonably well. Still not very symptomatic. 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 He has been tracking heart rate trend. Heart rates have gradually decreased from an average of 140 bpm down to 120 bpm since amiodarone was started. He denies dyspnea. He is not sleeping well, waking up frequently. No chest pain, orthopnea, or PND.   Past Medical History:  Diagnosis Date  . GERD (gastroesophageal reflux disease)   . History of Bell's palsy   . History of pericarditis   . Hypertension   . Mixed hyperlipidemia   . Myasthenia gravis (HCC)    currently in remission (11/2014)     Past Surgical History:  Procedure Laterality Date  . CARDIOVERSION N/A 09/21/2016   Procedure: CARDIOVERSION;  Surgeon: Thurmon Fairroitoru, Mihai, MD;  Location: [facility] ENDOSCOPY;  Service: Cardiovascular;  Laterality: N/A;  . LAMINECTOMY  1991  . TEE WITHOUT CARDIOVERSION N/A 09/21/2016   Procedure: TRANSESOPHAGEAL ECHOCARDIOGRAM (TEE);  Surgeon: Thurmon Fairroitoru, Mihai, MD;  [facility];  Service: Cardiovascular;  Laterality: N/A;  . TONSILLECTOMY  1950    Current Medications: Outpatient  Medications Prior to Visit  Medication Sig Dispense Refill  . Alpha-Lipoic Acid 300 MG CAPS Take 300 mg by mouth daily.     Marland Kitchen. amiodarone (PACERONE) 200 MG tablet Take 1 tablet (200 mg total) by mouth 2 (two) times daily. 180 tablet 3  . apixaban (ELIQUIS) 5 MG TABS tablet Take 1 tablet (5 mg total) by mouth 2 (two) times daily. 60 tablet 3  . atorvastatin (LIPITOR) 20 MG tablet TAKE 1 TABLET(20 MG) BY MOUTH DAILY 30 tablet 0  . Coenzyme Q10 (COQ-10) 100 MG CAPS Take 100 mg by mouth daily.     . Multiple Vitamins-Minerals (CENTRUM SILVER PO) Take 1 tablet by mouth daily.    . mycophenolate (CELLCEPT) 500 MG tablet Take 2 tablets (1,000 mg total) by mouth 2 (two) times daily. 120 tablet 12  . Omega-3 Fatty Acids (OMEGA-3 PO) Take 1,040 mg by mouth daily.    Marland Kitchen. pyridostigmine (MESTINON) 60 MG tablet Take 1 tablet (60 mg total) by mouth 3 (three) times daily as needed. 90 tablet 6  . ramipril (ALTACE) 5 MG capsule Take 5 mg by mouth daily.    Marland Kitchen. Resveratrol 100 MG CAPS Take 100 mg by mouth 2 (two) times daily.     . tamsulosin (FLOMAX) 0.4 MG CAPS capsule Take 0.4 mg by mouth daily.    Marland Kitchen. triamterene-hydrochlorothiazide (MAXZIDE-25) 37.5-25 MG tablet Take 2 tablets by mouth daily. 60 tablet 3  . TURMERIC PO Take 100 mg by mouth 2 (two) times daily.     No facility-administered medications prior to visit.      Allergies:   Demerol [meperidine];  Aminoglycosides; Beta adrenergic blockers; Calcium channel blockers; Ciprofloxacin; Iodinated diagnostic agents; Penicillamine; Procainamide hcl; Quinine derivatives; Succinylcholine chloride; and Vecuronium bromide [vecuronium]   Social History   Social History  . Marital status: Married    Spouse name: N/A  . Number of children: 1  . Years of education: Grad Sch   Occupational History  . [employer]    Social History Main Topics  . Smoking status: Former Smoker    Types: Pipe    Quit date: 12/10/1966  . Smokeless tobacco: Never Used  .  Alcohol use 0.6 oz/week    1 Glasses of wine per week  . Drug use: No  . Sexual activity: Not Asked   Other Topics Concern  . None   Social History Narrative   Lives at home with his wife.   Right-handed.   Occasional caffeine use.     Family History:  The patient's family history includes Cancer in his brother and mother; Healthy in his daughter; Heart disease in his father.   ROS:   Please see the history of present illness.    Not sleeping well. Recent sleep study was without evidence of obstructive sleep apnea.  All other systems reviewed and are negative.   PHYSICAL EXAM:   VS:  BP 100/70 (BP Location: Right Arm)   Pulse (!) 129   Ht 5\' 7"  (1.702 m)   Wt 223 lb 3.2 oz (101.2 kg)   BMI 34.96 kg/m    GEN: Well nourished, well developed, in no acute distress  HEENT: normal  Neck: no JVD, carotid bruits, or masses Cardiac: IIRapidRate; no murmurs, rubs, or gallops,no edema  Respiratory:  clear to auscultation bilaterally, normal work of breathing GI: soft, nontender, nondistended, + BS MS: no deformity or atrophy  Skin: warm and dry, no rash Neuro:  Alert and Oriented x 3, Strength and sensation are intact Psych: euthymic mood, full affect  Wt Readings from Last 3 Encounters:  10/04/16 223 lb 3.2 oz (101.2 kg)  09/24/16 217 lb (98.4 kg)  09/21/16 216 lb (98 kg)      Studies/Labs Reviewed:   EKG:  EKG  Atrial fibrillation with rapid ventricular rate at 129 bpm.  Recent Labs: 03/26/2016: TSH 1.170 09/20/2016: ALT 24; B Natriuretic Peptide 319.2; BUN 20; Creatinine, Ser 0.94; Potassium 3.9; Sodium 140 09/21/2016: Hemoglobin 13.7; Platelets 171   Lipid Panel    Component Value Date/Time   CHOL 144 09/13/2015 1041   CHOL 218 (H) 12/14/2014 0915   TRIG 60 09/13/2015 1041   TRIG 77 12/14/2014 0915   HDL 56 09/13/2015 1041   HDL 52 12/14/2014 0915   CHOLHDL 2.6 09/13/2015 1041   VLDL 12 09/13/2015 1041   LDLCALC 76 09/13/2015 1041   LDLCALC 151 (H) 12/14/2014  0915    Additional studies/ records that were reviewed today include:  Review data from the Kard[employer] APP and very is trending reduction in heart rate, less than 120 bpm quite frequently.    ASSESSMENT:    1. Persistent atrial fibrillation (HCC)   2. Myasthenia gravis (HCC)   3. Essential hypertension   4. High coronary artery calcium score   5. Hyperlipidemia, unspecified hyperlipidemia type      PLAN:  In order of problems listed above:  1. Atrial fibrillation with rapid ventricular response, gradually slowing. We'll set the patient up for elective cardioversion in 10-14 days. This will be a proximally 3-4 weeks of amiodarone loading at 400 mg per day. Post cardioversion he should  decrease it to 200 mg per day. He will need a basic metabolic panel prior to the procedure. 2. Myasthenia Gravis has been stable. Neurology cleared the use of amiodarone. 3. Relatively low blood pressure. 4. No clinical issues currently. 5. Not addressed   Clinical follow-up one week after cardioversion with EKG. Basic metabolic panel prior to cardioversion. Continue anticoagulation therapy as he is including taking his medication on the morning of the procedure. Decrease amiodarone to 200 mg daily post cardioversion. If he is unable to maintain sinus rhythm, will refer to EP for consideration of additional therapies. Medication Adjustments/Labs and Tests Ordered: Current medicines are reviewed at length with the patient today.  Concerns regarding medicines are outlined above.  Medication changes, Labs and Tests ordered today are listed in the Patient Instructions below. There are no Patient Instructions on file for this visit.   Signed, Lesleigh Noe, MD  10/04/2016 11:18 AM    [facility] [address] [place], [place], [place]  [address] Phone: [phone]; Fax: [phone]

## 2016-10-04 ENCOUNTER — Encounter: Payer: Self-pay | Admitting: *Deleted

## 2016-10-04 ENCOUNTER — Encounter: Payer: Self-pay | Admitting: Interventional Cardiology

## 2016-10-04 ENCOUNTER — Ambulatory Visit (INDEPENDENT_AMBULATORY_CARE_PROVIDER_SITE_OTHER): Payer: Medicare Other | Admitting: Interventional Cardiology

## 2016-10-04 VITALS — BP 100/70 | HR 129 | Ht 67.0 in | Wt 223.2 lb

## 2016-10-04 DIAGNOSIS — G7000 Myasthenia gravis without (acute) exacerbation: Secondary | ICD-10-CM

## 2016-10-04 DIAGNOSIS — I4819 Other persistent atrial fibrillation: Secondary | ICD-10-CM

## 2016-10-04 DIAGNOSIS — I10 Essential (primary) hypertension: Secondary | ICD-10-CM

## 2016-10-04 DIAGNOSIS — R931 Abnormal findings on diagnostic imaging of heart and coronary circulation: Secondary | ICD-10-CM | POA: Diagnosis not present

## 2016-10-04 DIAGNOSIS — E785 Hyperlipidemia, unspecified: Secondary | ICD-10-CM | POA: Diagnosis not present

## 2016-10-04 DIAGNOSIS — I481 Persistent atrial fibrillation: Secondary | ICD-10-CM

## 2016-10-04 LAB — BASIC METABOLIC PANEL
BUN / CREAT RATIO: 24 (ref 10–24)
BUN: 22 mg/dL (ref 8–27)
CALCIUM: 9.1 mg/dL (ref 8.6–10.2)
CHLORIDE: 102 mmol/L (ref 96–106)
CO2: 23 mmol/L (ref 20–29)
Creatinine, Ser: 0.93 mg/dL (ref 0.76–1.27)
GFR, EST AFRICAN AMERICAN: 94 mL/min/{1.73_m2} (ref >59)
GFR, EST NON AFRICAN AMERICAN: 82 mL/min/{1.73_m2} (ref >59)
GLUCOSE: 91 mg/dL (ref 65–99)
POTASSIUM: 3.9 mmol/L (ref 3.5–5.2)
SODIUM: 139 mmol/L (ref 134–144)

## 2016-10-04 LAB — CBC
HEMOGLOBIN: 14.6 g/dL (ref 13.0–17.7)
Hematocrit: 43.8 % (ref 37.5–51.0)
MCH: 29.9 pg (ref 26.6–33.0)
MCHC: 33.3 g/dL (ref 31.5–35.7)
MCV: 90 fL (ref 79–97)
Platelets: 217 10*3/uL (ref 150–379)
RBC: 4.88 x10E6/uL (ref 4.14–5.80)
RDW: 13.3 % (ref 12.3–15.4)
WBC: 9.2 10*3/uL (ref 3.4–10.8)

## 2016-10-04 LAB — PROTIME-INR
INR: 1.1 (ref 0.8–1.2)
Prothrombin Time: 11.6 s (ref 9.1–12.0)

## 2016-10-04 NOTE — Patient Instructions (Signed)
Medication Instructions:  None  Labwork: BMET, CBC and INR today  Testing/Procedures: Your physician has recommended that you have a Cardioversion (DCCV). Electrical Cardioversion uses a jolt of electricity to your heart either through paddles or wired patches attached to your chest. This is a controlled, usually prescheduled, procedure. Defibrillation is done under light anesthesia in the hospital, and you usually go home the day of the procedure. This is done to get your heart back into a normal rhythm. You are not awake for the procedure. Please see the instruction sheet given to you today.   Follow-Up: Your physician recommends that you schedule a follow-up appointment in: 7-10 days after your procedure with a PA or NP with an EKG.    Any Other Special Instructions Will Be Listed Below (If Applicable).     If you need a refill on your cardiac medications before your next appointment, please call your pharmacy.

## 2016-10-12 ENCOUNTER — Other Ambulatory Visit: Payer: Self-pay | Admitting: Interventional Cardiology

## 2016-10-17 ENCOUNTER — Ambulatory Visit (HOSPITAL_COMMUNITY)
Admission: RE | Admit: 2016-10-17 | Discharge: 2016-10-17 | Disposition: A | Discharge status: Home / Self Care | Payer: Medicare Other | Source: Ambulatory Visit | Attending: Cardiology | Admitting: Cardiology

## 2016-10-17 ENCOUNTER — Encounter (HOSPITAL_COMMUNITY)
Admission: RE | Disposition: A | Discharge status: Home / Self Care | Payer: Self-pay | Source: Ambulatory Visit | Attending: Cardiology

## 2016-10-17 ENCOUNTER — Ambulatory Visit (HOSPITAL_COMMUNITY): Payer: Medicare Other | Admitting: Anesthesiology

## 2016-10-17 ENCOUNTER — Encounter (HOSPITAL_COMMUNITY): Payer: Self-pay | Admitting: *Deleted

## 2016-10-17 DIAGNOSIS — I491 Atrial premature depolarization: Secondary | ICD-10-CM | POA: Diagnosis not present

## 2016-10-17 DIAGNOSIS — I2510 Atherosclerotic heart disease of native coronary artery without angina pectoris: Secondary | ICD-10-CM | POA: Diagnosis not present

## 2016-10-17 DIAGNOSIS — I481 Persistent atrial fibrillation: Secondary | ICD-10-CM | POA: Diagnosis not present

## 2016-10-17 DIAGNOSIS — Z8669 Personal history of other diseases of the nervous system and sense organs: Secondary | ICD-10-CM | POA: Diagnosis not present

## 2016-10-17 DIAGNOSIS — G7000 Myasthenia gravis without (acute) exacerbation: Secondary | ICD-10-CM | POA: Insufficient documentation

## 2016-10-17 DIAGNOSIS — E782 Mixed hyperlipidemia: Secondary | ICD-10-CM | POA: Insufficient documentation

## 2016-10-17 DIAGNOSIS — K219 Gastro-esophageal reflux disease without esophagitis: Secondary | ICD-10-CM | POA: Diagnosis not present

## 2016-10-17 DIAGNOSIS — I739 Peripheral vascular disease, unspecified: Secondary | ICD-10-CM | POA: Diagnosis not present

## 2016-10-17 DIAGNOSIS — Z79899 Other long term (current) drug therapy: Secondary | ICD-10-CM | POA: Diagnosis not present

## 2016-10-17 DIAGNOSIS — Z7901 Long term (current) use of anticoagulants: Secondary | ICD-10-CM | POA: Diagnosis not present

## 2016-10-17 DIAGNOSIS — Z87891 Personal history of nicotine dependence: Secondary | ICD-10-CM | POA: Insufficient documentation

## 2016-10-17 DIAGNOSIS — I4891 Unspecified atrial fibrillation: Secondary | ICD-10-CM | POA: Diagnosis not present

## 2016-10-17 DIAGNOSIS — Z6833 Body mass index (BMI) 33.0-33.9, adult: Secondary | ICD-10-CM | POA: Insufficient documentation

## 2016-10-17 DIAGNOSIS — I10 Essential (primary) hypertension: Secondary | ICD-10-CM | POA: Diagnosis not present

## 2016-10-17 HISTORY — PX: CARDIOVERSION: SHX1299

## 2016-10-17 SURGERY — CARDIOVERSION
Anesthesia: General

## 2016-10-17 MED ORDER — SODIUM CHLORIDE 0.9 % IV SOLN
INTRAVENOUS | Status: DC
  Administered 2016-10-17: 13:00:00 via INTRAVENOUS

## 2016-10-17 MED ORDER — PHENYLEPHRINE HCL 10 MG/ML IJ SOLN
INTRAMUSCULAR | Status: DC | PRN
  Administered 2016-10-17: 40 ug via INTRAVENOUS

## 2016-10-17 MED ORDER — PROPOFOL 10 MG/ML IV BOLUS
INTRAVENOUS | Status: DC | PRN
  Administered 2016-10-17: 110 mg via INTRAVENOUS

## 2016-10-17 MED ORDER — LIDOCAINE HCL (CARDIAC) 20 MG/ML IV SOLN
INTRAVENOUS | Status: DC | PRN
  Administered 2016-10-17: 30 mg via INTRAVENOUS

## 2016-10-17 NOTE — Procedures (Signed)
Electrical Cardioversion Procedure Note Navon A Malloy [id number] January 10, 1946  Procedure: Electrical Cardioversion Indications:  Atrial Fibrillation.  The patient is on Eliquis and has not missed.   Procedure Details Consent: Risks of procedure as well as the alternatives and risks of each were explained to the (patient/caregiver).  Consent for procedure obtained. Time Out: Verified patient identification, verified procedure, site/side was marked, verified correct patient position, special equipment/implants available, medications/allergies/relevent history reviewed, required imaging and test results available.  Performed  Patient placed on cardiac monitor, pulse oximetry, supplemental oxygen as necessary.  Sedation given: Per anesthesiology Pacer pads placed anterior and posterior chest.  Cardioverted 1 time(s).  Cardioverted at 200J.  Evaluation Findings: Post procedure EKG shows: NSR Complications: None Patient did tolerate procedure well.   Marca AnconaDalton Yaiza Palazzola 10/17/2016, 1:09 PM

## 2016-10-17 NOTE — Interval H&P Note (Signed)
History and Physical Interval Note:  10/17/2016 12:56 PM  [name]  has presented today for surgery, with the diagnosis of AFIB  The various methods of treatment have been discussed with the patient and family. After consideration of risks, benefits and other options for treatment, the patient has consented to  Procedure(s): CARDIOVERSION (N/A) as a surgical intervention .  The patient's history has been reviewed, patient examined, no change in status, stable for surgery.  I have reviewed the patient's chart and labs.  Questions were answered to the patient's satisfaction.     Dalton [employer]apeake EnergyMcLean

## 2016-10-17 NOTE — Anesthesia Preprocedure Evaluation (Addendum)
Anesthesia Evaluation  Patient identified by MRN, date of birth, ID band Patient awake    Reviewed: Allergy & Precautions, NPO status , Patient's Chart, lab work & pertinent test results  Airway Mallampati: II  TM Distance: >3 FB Neck ROM: Full    Dental no notable dental hx. (+) Teeth Intact, Dental Advisory Given   Pulmonary former smoker,    breath sounds clear to auscultation       Cardiovascular hypertension, + CAD and + Peripheral Vascular Disease  + dysrhythmias  Rhythm:Regular Rate:Normal     Neuro/Psych myasthenia gravis    GI/Hepatic Neg liver ROS, GERD  Medicated and Controlled,  Endo/Other  Morbid obesity  Renal/GU negative Renal ROS     Musculoskeletal   Abdominal   Peds  Hematology   Anesthesia Other Findings   Reproductive/Obstetrics                            Anesthesia Physical Anesthesia Plan  ASA: III  Anesthesia Plan: General   Post-op Pain Management:    Induction: Intravenous  PONV Risk Score and Plan: 2 and Ondansetron and Dexamethasone  Airway Management Planned: Mask  Additional Equipment:   Intra-op Plan:   Post-operative Plan: Extubation in OR  Informed Consent: I have reviewed the patients History and Physical, chart, labs and discussed the procedure including the risks, benefits and alternatives for the proposed anesthesia with the patient or authorized representative who has indicated his/her understanding and acceptance.     Plan Discussed with:   Anesthesia Plan Comments:         Anesthesia Quick Evaluation

## 2016-10-17 NOTE — Anesthesia Procedure Notes (Signed)
Procedure Name: General with mask airway Date/Time: 10/17/2016 1:02 PM Performed by: Romie MinusOCK, Candita Borenstein K Pre-anesthesia Checklist: Emergency Drugs available, Suction available, Patient being monitored and Timeout performed Patient Re-evaluated:Patient Re-evaluated prior to induction Oxygen Delivery Method: Ambu bag Preoxygenation: Pre-oxygenation with 100% oxygen Induction Type: IV induction Ventilation: Mask ventilation without difficulty

## 2016-10-17 NOTE — Anesthesia Postprocedure Evaluation (Signed)
Anesthesia Post Note  Patient: [name]  Procedure(s) Performed: Procedure(s) (LRB): CARDIOVERSION (N/A)     Patient location during evaluation: PACU Anesthesia Type: General Level of consciousness: awake and alert Pain management: pain level controlled Vital Signs Assessment: post-procedure vital signs reviewed and stable Respiratory status: spontaneous breathing, nonlabored ventilation, respiratory function stable and patient connected to nasal cannula oxygen Cardiovascular status: blood pressure returned to baseline and stable Postop Assessment: no signs of nausea or vomiting Anesthetic complications: no    Last Vitals:  Vitals:   10/17/16 1339 10/17/16 1340  BP: (!) 101/58 107/82  Pulse: 67 65  Resp: 17 15  Temp:    SpO2: 95% 97%    Last Pain:  Vitals:   10/17/16 1320  TempSrc: Oral  PainSc:                  Giamarie Bueche,JAMES TERRILL

## 2016-10-17 NOTE — H&P (View-Only) (Signed)
Cardiology Office Note    Date:  10/04/2016   ID:  [name], DOB Feb 26, 1945, MRN [id number]  PCP:  Merri [name], Candace, MD  Cardiologist: Lesleigh NoeHenry W Smith III, MD   Chief Complaint  Patient presents with  . Atrial Fibrillation    History of Present Illness:  [name] is a 71 y.o. male history of myasthenia gravis, recently admitted with atrial fibrillation and underwent electrical cardioversion to sinus rhythm within the last 72 hours. Called this morning because heart rate was elevated again and he is concerned there is recurrent atrial fibrillation. Recurrent AF being treated with amiodarone loading and eventual repeated electrical cardioversion.  TEE guided electrical cardioversion was performed 09/21/2016. Within 72 hours he had relapsed to atrial fibrillation with rapid ventricular response. Tolerating amiodarone reasonably well. Still not very symptomatic. 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 He has been tracking heart rate trend. Heart rates have gradually decreased from an average of 140 bpm down to 120 bpm since amiodarone was started. He denies dyspnea. He is not sleeping well, waking up frequently. No chest pain, orthopnea, or PND.   Past Medical History:  Diagnosis Date  . GERD (gastroesophageal reflux disease)   . History of Bell's palsy   . History of pericarditis   . Hypertension   . Mixed hyperlipidemia   . Myasthenia gravis (HCC)    currently in remission (11/2014)     Past Surgical History:  Procedure Laterality Date  . CARDIOVERSION N/A 09/21/2016   Procedure: CARDIOVERSION;  Surgeon: Thurmon [name], Mihai, MD;  Location: [facility];  Service: Cardiovascular;  Laterality: N/A;  . LAMINECTOMY  1991  . TEE WITHOUT CARDIOVERSION N/A 09/21/2016   Procedure: TRANSESOPHAGEAL ECHOCARDIOGRAM (TEE);  Surgeon: Thurmon [name], Mihai, MD;  [facility];  Service: Cardiovascular;  Laterality: N/A;  . TONSILLECTOMY  1950    Current Medications: Outpatient  Medications Prior to Visit  Medication Sig Dispense Refill  . Alpha-Lipoic Acid 300 MG CAPS Take 300 mg by mouth daily.     Marland Kitchen. amiodarone (PACERONE) 200 MG tablet Take 1 tablet (200 mg total) by mouth 2 (two) times daily. 180 tablet 3  . apixaban (ELIQUIS) 5 MG TABS tablet Take 1 tablet (5 mg total) by mouth 2 (two) times daily. 60 tablet 3  . atorvastatin (LIPITOR) 20 MG tablet TAKE 1 TABLET(20 MG) BY MOUTH DAILY 30 tablet 0  . Coenzyme Q10 (COQ-10) 100 MG CAPS Take 100 mg by mouth daily.     . Multiple Vitamins-Minerals (CENTRUM SILVER PO) Take 1 tablet by mouth daily.    . mycophenolate (CELLCEPT) 500 MG tablet Take 2 tablets (1,000 mg total) by mouth 2 (two) times daily. 120 tablet 12  . Omega-3 Fatty Acids (OMEGA-3 PO) Take 1,040 mg by mouth daily.    Marland Kitchen. pyridostigmine (MESTINON) 60 MG tablet Take 1 tablet (60 mg total) by mouth 3 (three) times daily as needed. 90 tablet 6  . ramipril (ALTACE) 5 MG capsule Take 5 mg by mouth daily.    Marland Kitchen. Resveratrol 100 MG CAPS Take 100 mg by mouth 2 (two) times daily.     . tamsulosin (FLOMAX) 0.4 MG CAPS capsule Take 0.4 mg by mouth daily.    Marland Kitchen. triamterene-hydrochlorothiazide (MAXZIDE-25) 37.5-25 MG tablet Take 2 tablets by mouth daily. 60 tablet 3  . TURMERIC PO Take 100 mg by mouth 2 (two) times daily.     No facility-administered medications prior to visit.      Allergies:   Demerol [meperidine];  Aminoglycosides; Beta adrenergic blockers; Calcium channel blockers; Ciprofloxacin; Iodinated diagnostic agents; Penicillamine; Procainamide hcl; Quinine derivatives; Succinylcholine chloride; and Vecuronium bromide [vecuronium]   Social History   Social History  . Marital status: Married    Spouse name: N/A  . Number of children: 1  . Years of education: Grad Sch   Occupational History  . [employer]    Social History Main Topics  . Smoking status: Former Smoker    Types: Pipe    Quit date: 12/10/1966  . Smokeless tobacco: Never Used  .  Alcohol use 0.6 oz/week    1 Glasses of wine per week  . Drug use: No  . Sexual activity: Not Asked   Other Topics Concern  . None   Social History Narrative   Lives at home with his wife.   Right-handed.   Occasional caffeine use.     Family History:  The patient's family history includes Cancer in his brother and mother; Healthy in his daughter; Heart disease in his father.   ROS:   Please see the history of present illness.    Not sleeping well. Recent sleep study was without evidence of obstructive sleep apnea.  All other systems reviewed and are negative.   PHYSICAL EXAM:   VS:  BP 100/70 (BP Location: Right Arm)   Pulse (!) 129   Ht 5\' 7"  (1.702 m)   Wt 223 lb 3.2 oz (101.2 kg)   BMI 34.96 kg/m    GEN: Well nourished, well developed, in no acute distress  HEENT: normal  Neck: no JVD, carotid bruits, or masses Cardiac: IIRapidRate; no murmurs, rubs, or gallops,no edema  Respiratory:  clear to auscultation bilaterally, normal work of breathing GI: soft, nontender, nondistended, + BS MS: no deformity or atrophy  Skin: warm and dry, no rash Neuro:  Alert and Oriented x 3, Strength and sensation are intact Psych: euthymic mood, full affect  Wt Readings from Last 3 Encounters:  10/04/16 223 lb 3.2 oz (101.2 kg)  09/24/16 217 lb (98.4 kg)  09/21/16 216 lb (98 kg)      Studies/Labs Reviewed:   EKG:  EKG  Atrial fibrillation with rapid ventricular rate at 129 bpm.  Recent Labs: 03/26/2016: TSH 1.170 09/20/2016: ALT 24; B Natriuretic Peptide 319.2; BUN 20; Creatinine, Ser 0.94; Potassium 3.9; Sodium 140 09/21/2016: Hemoglobin 13.7; Platelets 171   Lipid Panel    Component Value Date/Time   CHOL 144 09/13/2015 1041   CHOL 218 (H) 12/14/2014 0915   TRIG 60 09/13/2015 1041   TRIG 77 12/14/2014 0915   HDL 56 09/13/2015 1041   HDL 52 12/14/2014 0915   CHOLHDL 2.6 09/13/2015 1041   VLDL 12 09/13/2015 1041   LDLCALC 76 09/13/2015 1041   LDLCALC 151 (H) 12/14/2014  0915    Additional studies/ records that were reviewed today include:  Review data from the [employer] APP and very is trending reduction in heart rate, less than 120 bpm quite frequently.    ASSESSMENT:    1. Persistent atrial fibrillation (HCC)   2. Myasthenia gravis (HCC)   3. Essential hypertension   4. High coronary artery calcium score   5. Hyperlipidemia, unspecified hyperlipidemia type      PLAN:  In order of problems listed above:  1. Atrial fibrillation with rapid ventricular response, gradually slowing. We'll set the patient up for elective cardioversion in 10-14 days. This will be a proximally 3-4 weeks of amiodarone loading at 400 mg per day. Post cardioversion he should  decrease it to 200 mg per day. He will need a basic metabolic panel prior to the procedure. 2. Myasthenia Gravis has been stable. Neurology cleared the use of amiodarone. 3. Relatively low blood pressure. 4. No clinical issues currently. 5. Not addressed   Clinical follow-up one week after cardioversion with EKG. Basic metabolic panel prior to cardioversion. Continue anticoagulation therapy as he is including taking his medication on the morning of the procedure. Decrease amiodarone to 200 mg daily post cardioversion. If he is unable to maintain sinus rhythm, will refer to EP for consideration of additional therapies. Medication Adjustments/Labs and Tests Ordered: Current medicines are reviewed at length with the patient today.  Concerns regarding medicines are outlined above.  Medication changes, Labs and Tests ordered today are listed in the Patient Instructions below. There are no Patient Instructions on file for this visit.   Signed, Lesleigh Noe, MD  10/04/2016 11:18 AM    [facility] [address] [place], [place], [place]  [address] Phone: [phone]; Fax: [phone]

## 2016-10-17 NOTE — Transfer of Care (Signed)
Immediate Anesthesia Transfer of Care Note  Patient: Debroah LoopNicholas A Yu  Procedure(s) Performed: Procedure(s): CARDIOVERSION (N/A)  Patient Location: Endoscopy Unit  Anesthesia Type:General  Level of Consciousness: awake, oriented and patient cooperative  Airway & Oxygen Therapy: Patient Spontanous Breathing and Patient connected to nasal cannula oxygen  Post-op Assessment: Report given to RN and Post -op Vital signs reviewed and stable  Post vital signs: Reviewed  Last Vitals:  Vitals:   10/17/16 1151 10/17/16 1315  BP: (!) 119/94   Resp: 20 12  Temp: 37 C   SpO2: 99%     Last Pain:  Vitals:   10/17/16 1151  TempSrc: Oral  PainSc: 3       Patients Stated Pain Goal: 0 (10/17/16 1151)  Complications: No apparent anesthesia complications

## 2016-10-21 ENCOUNTER — Telehealth: Payer: Self-pay | Admitting: Cardiology

## 2016-10-21 NOTE — Telephone Encounter (Signed)
Pt went back into a fib on Friday after DCCV on 09/21/16, he continues on amiodarone 200 BID and Eliquis.  I discussed with Dr. Johney FrameAllred and he suggested no change in meds but to be seen in A fib clinic on Monday or Tues.  Pt is mildly breathless climbing steps but stable upper HR 108 to 110.  I have sent message to Sebastian Acheonna Carrol and will let Dr. Katrinka BlazingSmith know. Pt agreeable

## 2016-10-22 ENCOUNTER — Ambulatory Visit (HOSPITAL_COMMUNITY)
Admission: RE | Admit: 2016-10-22 | Discharge: 2016-10-22 | Disposition: A | Discharge status: Home / Self Care | Payer: Medicare Other | Source: Ambulatory Visit | Attending: Nurse Practitioner | Admitting: Nurse Practitioner

## 2016-10-22 ENCOUNTER — Encounter (HOSPITAL_COMMUNITY): Payer: Self-pay | Admitting: Nurse Practitioner

## 2016-10-22 VITALS — BP 116/72 | HR 118 | Ht 67.0 in | Wt 222.6 lb

## 2016-10-22 DIAGNOSIS — G7000 Myasthenia gravis without (acute) exacerbation: Secondary | ICD-10-CM | POA: Diagnosis not present

## 2016-10-22 DIAGNOSIS — E782 Mixed hyperlipidemia: Secondary | ICD-10-CM | POA: Insufficient documentation

## 2016-10-22 DIAGNOSIS — K219 Gastro-esophageal reflux disease without esophagitis: Secondary | ICD-10-CM | POA: Diagnosis not present

## 2016-10-22 DIAGNOSIS — I4510 Unspecified right bundle-branch block: Secondary | ICD-10-CM | POA: Diagnosis not present

## 2016-10-22 DIAGNOSIS — I481 Persistent atrial fibrillation: Secondary | ICD-10-CM | POA: Insufficient documentation

## 2016-10-22 DIAGNOSIS — I4819 Other persistent atrial fibrillation: Secondary | ICD-10-CM

## 2016-10-23 ENCOUNTER — Telehealth (HOSPITAL_COMMUNITY): Payer: Self-pay | Admitting: *Deleted

## 2016-10-23 ENCOUNTER — Other Ambulatory Visit (HOSPITAL_COMMUNITY): Payer: Self-pay | Admitting: *Deleted

## 2016-10-23 DIAGNOSIS — I4819 Other persistent atrial fibrillation: Secondary | ICD-10-CM

## 2016-10-23 NOTE — Progress Notes (Addendum)
Primary Care Physician: David Brunette, MD Referring Physician: [facility] Triage Cardiologist:Dr. Narayan [name] is a 71 y.o. male with a h/o myasthenia gravis, recently admitted with atrial fibrillation and underwent electrical cardioversion to sinus rhythm but ERAF within 72 hours.  Recurrent AF  treated with amiodarone x 3 weeks  and repeat  electrical cardioversion, again with ERAF within 49-72 hours.   Tolerating amiodarone reasonably well. Still not very symptomatic. 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 He has been tracking heart rate trend. Heart rates running around 110-115 bpm since amiodarone was started. He denies dyspnea.  No chest pain, orthopnea, or PND.  He is in the afib clinic for recurrent afib. Unfortunately with myasthenia gravis, CCB or BBB are not recommended for possible exacerbation of symptoms related to this. He does have a severely dilated left atrium by echo which will undermine efforts to restore SR. He continues to be minimally symptomatic. No missed doses of apixaban.  Today, he denies symptoms of palpitations, chest pain, shortness of breath, orthopnea, PND, lower extremity edema, dizziness, presyncope, syncope, or neurologic sequela. The patient is tolerating medications without difficulties and is otherwise without complaint today.   Past Medical History:  Diagnosis Date  . GERD (gastroesophageal reflux disease)   . History of Bell's palsy   . History of pericarditis   . Hypertension   . Mixed hyperlipidemia   . Myasthenia gravis (HCC)    currently in remission (11/2014)    Past Surgical History:  Procedure Laterality Date  . CARDIOVERSION N/A 09/21/2016   Procedure: CARDIOVERSION;  Surgeon: David Fair, MD;  Location: [facility];  Service: Cardiovascular;  Laterality: N/A;  . CARDIOVERSION N/A 10/17/2016   Procedure: CARDIOVERSION;  Surgeon: David Morale, MD;  Location: [facility] ENDOSCOPY;  Service: Cardiovascular;  Laterality: N/A;    . LAMINECTOMY  1991  . TEE WITHOUT CARDIOVERSION N/A 09/21/2016   Procedure: TRANSESOPHAGEAL ECHOCARDIOGRAM (TEE);  Surgeon: David Fair, MD;  Location: [facility] ENDOSCOPY;  Service: Cardiovascular;  Laterality: N/A;  . TONSILLECTOMY  1950    Current Outpatient Prescriptions  Medication Sig Dispense Refill  . Alpha-Lipoic Acid 300 MG CAPS Take 300 mg by mouth daily.     Marland Kitchen apixaban (ELIQUIS) 5 MG TABS tablet Take 1 tablet (5 mg total) by mouth 2 (two) times daily. 60 tablet 3  . atorvastatin (LIPITOR) 20 MG tablet TAKE 1 TABLET BY MOUTH DAILY 30 tablet 11  . Coenzyme Q10 (COQ-10) 100 MG CAPS Take 100 mg by mouth daily.     . Multiple Vitamins-Minerals (CENTRUM SILVER PO) Take 1 tablet by mouth daily.    . mycophenolate (CELLCEPT) 500 MG tablet Take 2 tablets (1,000 mg total) by mouth 2 (two) times daily. 120 tablet 12  . Omega-3 Fatty Acids (OMEGA-3 PO) Take 1,040 mg by mouth daily.    Marland Kitchen pyridostigmine (MESTINON) 60 MG tablet Take 1 tablet (60 mg total) by mouth 3 (three) times daily as needed. 90 tablet 6  . ramipril (ALTACE) 5 MG capsule Take 5 mg by mouth daily.    Marland Kitchen Resveratrol 100 MG CAPS Take 100 mg by mouth 2 (two) times daily.     . tamsulosin (FLOMAX) 0.4 MG CAPS capsule Take 0.4 mg by mouth daily.    Marland Kitchen triamterene-hydrochlorothiazide (MAXZIDE-25) 37.5-25 MG tablet Take 2 tablets by mouth daily. 60 tablet 3  . TURMERIC PO Take 100 mg by mouth 2 (two) times daily.    Marland Kitchen amiodarone (PACERONE) 200 MG tablet Take  1 tablet (200 mg total) by mouth 2 (two) times daily. 180 tablet 3   No current facility-administered medications for this encounter.     Allergies  Allergen Reactions  . Demerol [Meperidine]     hallucinations  . Aminoglycosides     Can not use due to mysthenia gravis  . Beta Adrenergic Blockers     Can not use due to mysthenia gravis  . Calcium Channel Blockers     Can not use due to mysthenia gravis  . Ciprofloxacin     Can not use due to mysthenia gravis  .  Iodinated Diagnostic Agents     Can not use due to mysthenia gravis  . Penicillamine     Can not use due to mysthenia gravis  . Procainamide Hcl     Can not use due to mysthenia gravis  . Quinine Derivatives     Can not use due to mysthenia gravis  . Succinylcholine Chloride     Can not use due to mysthenia gravis  . Vecuronium Bromide [Vecuronium]     Can not use due to mysthenia gravis    Social History   Social History  . Marital status: Married    Spouse name: N/A  . Number of children: 1  . Years of education: Grad Sch   Occupational History  . [employer]    Social History Main Topics  . Smoking status: Former Smoker    Types: Pipe    Quit date: 12/10/1966  . Smokeless tobacco: Never Used  . Alcohol use 0.6 oz/week    1 Glasses of wine per week  . Drug use: No  . Sexual activity: Not on file   Other Topics Concern  . Not on file   Social History Narrative   Lives at home with his wife.   Right-handed.   Occasional caffeine use.    Family History  Problem Relation Age of Onset  . Cancer Mother   . Heart disease Father   . Cancer Brother        bladder/ in remission  . Healthy Daughter        age 73    ROS- All systems are reviewed and negative except as per the HPI above  Physical Exam: Vitals:   10/22/16 1431  BP: 116/72  Pulse: (!) 118  Weight: 222 lb 9.6 oz (101 kg)  Height: 5\' 7"  (1.702 m)   Wt Readings from Last 3 Encounters:  10/22/16 222 lb 9.6 oz (101 kg)  10/17/16 217 lb (98.4 kg)  10/04/16 223 lb 3.2 oz (101.2 kg)    Labs: Lab Results  Component Value Date   NA 139 10/04/2016   K 3.9 10/04/2016   CL 102 10/04/2016   CO2 23 10/04/2016   GLUCOSE 91 10/04/2016   BUN 22 10/04/2016   CREATININE 0.93 10/04/2016   CALCIUM 9.1 10/04/2016   MG 2.1 07/06/2007   Lab Results  Component Value Date   INR 1.1 10/04/2016   Lab Results  Component Value Date   CHOL 144 09/13/2015   HDL 56 09/13/2015   LDLCALC 76  09/13/2015   TRIG 60 09/13/2015     GEN- The patient is well appearing, alert and oriented x 3 today.   Head- normocephalic, atraumatic Eyes-  Sclera clear, conjunctiva pink Ears- hearing intact Oropharynx- clear Neck- supple, no JVP Lymph- no cervical lymphadenopathy Lungs- Clear to ausculation bilaterally, normal work of breathing Heart-irregular rate and rhythm, no murmurs, rubs or gallops,  PMI not laterally displaced GI- soft, NT, ND, + BS Extremities- no clubbing, cyanosis, or edema MS- no significant deformity or atrophy Skin- no rash or lesion Psych- euthymic mood, full affect Neuro- strength and sensation are intact  EKG- afib at 118 bpm, qrs int 112 ms, qtc 487  Epic records reviewed TEE-Study Conclusions  - Left ventricle: The cavity size was normal. Wall thickness was   normal. Systolic function was at the lower limits of normal. The   estimated ejection fraction was in the range of 50% to 55%. Wall   motion was normal; there were no regional wall motion   abnormalities. No evidence of thrombus. - Aortic valve: No evidence of vegetation. No evidence of   vegetation. There was trivial regurgitation. - Mitral valve: No evidence of vegetation. There was mild to   moderate regurgitation directed centrally. - Left atrium: The atrium was severely dilated. No evidence of   thrombus in the atrial cavity or appendage. No spontaneous echo   contrast was observed. - Right atrium: The atrium was dilated. - Atrial septum: No defect or patent foramen ovale was identified. - Tricuspid valve: No evidence of vegetation. No evidence of   vegetation. - Pulmonic valve: No evidence of vegetation.  Impressions:  - Successful cardioversion.   Assessment and Plan: 1. Persistent afib Failed DCCV alone and with amiodarone Discussed withDr. Allred Before we say amiodarone is a failure, he advised to increase amio to 400 mg bid and try cardioversion again in about 7-10 days. Pt  has not had any missed doses of eliquis Unfortunately, it is not advised by PharmD to add rate control, BB or CCB with myasthenia gravis  I worry severly dilated left atrium is undermining efforts to restore SR  Donna C. Matthew Folks [facility] [address] [place], [place] [address] [phone]  Addendum- 9/18- After my note was completed, pt called the office reporting that he had missed a dose of eliquis the week before. He did not want any additional wait  for cardioversion to meet the 21 days of uninterrupted days of eliquis  so he was agreeable to add TEE to DCCV.

## 2016-10-23 NOTE — Telephone Encounter (Signed)
Pt is scheduled for cardioversion on 10/31/16. Pt is to arrive at clinic at noon for labs and then proceed to cardioversion at 1230 for 2pm with Dr. Eden EmmsNishan. Pt instructed NPO after MN with sip of water for medication. Pt does report a missed dose of Eliquis on Thursday of last week -- TEE added and pt informed of this. Will continue Amiodarone 400mg  BID until cardioversion then reduce to Amiodarone 200mg  BID until follow up with Dr. Katrinka BlazingSmith beginning of October. Pt verbalized understanding.

## 2016-10-25 ENCOUNTER — Encounter (INDEPENDENT_AMBULATORY_CARE_PROVIDER_SITE_OTHER): Payer: Medicare Other | Admitting: Ophthalmology

## 2016-10-25 DIAGNOSIS — H35033 Hypertensive retinopathy, bilateral: Secondary | ICD-10-CM

## 2016-10-25 DIAGNOSIS — H353111 Nonexudative age-related macular degeneration, right eye, early dry stage: Secondary | ICD-10-CM

## 2016-10-25 DIAGNOSIS — H2513 Age-related nuclear cataract, bilateral: Secondary | ICD-10-CM

## 2016-10-25 DIAGNOSIS — H43813 Vitreous degeneration, bilateral: Secondary | ICD-10-CM

## 2016-10-25 DIAGNOSIS — I10 Essential (primary) hypertension: Secondary | ICD-10-CM

## 2016-10-25 DIAGNOSIS — H353221 Exudative age-related macular degeneration, left eye, with active choroidal neovascularization: Secondary | ICD-10-CM | POA: Diagnosis not present

## 2016-10-27 ENCOUNTER — Telehealth: Payer: Self-pay | Admitting: Physician Assistant

## 2016-10-27 NOTE — Telephone Encounter (Signed)
The patient called the answering service after-hours today. I received a page from the answering service that this was a 2nd call, with first call going out around 1pm. I never received a page from this patient earlier and did apologize for the delay getting back to him. He noticed this morning when he urinated that he had scant blood in the urine. Has prior h/o microscopic hematuria with cystoscopy several months ago that was normal per his report. He does have a known kidney stone. He's not had any frank hematuria or clots. He feels well otherwise. He alreeady spoke to urology on call who advised him to drink increased water, monitor urine flow and go to ER if urine flow stops or symptoms worsen. He does not wish to go to ER today. I reiterated these recommendations, also suggesting that if his urine does not stop but he has continued mild hematuria that urgent care might be an option to at least do a UA and CBC to ensure stability and r/o UTI. I asked him to call on Monday with an update as he does have f/u DCCV scheduled middle of next week, pending further input from Dr. Katrinka Blazing and Rudi Coco given this new information. The patient verbalized understanding and gratitude.Dayna Dunn PA-C

## 2016-10-29 ENCOUNTER — Ambulatory Visit: Payer: Medicare Other | Admitting: Physician Assistant

## 2016-10-29 NOTE — Telephone Encounter (Signed)
[name], please see how he is doing. We need to continue anticoagulation if at all possible. Hopefully, hematuria has resolved.

## 2016-10-30 NOTE — Addendum Note (Signed)
Encounter addended by: [name], NP on: 10/30/2016 12:00 PM<BR>    Actions taken: Sign clinical note

## 2016-10-31 ENCOUNTER — Ambulatory Visit (HOSPITAL_COMMUNITY): Payer: Medicare Other

## 2016-10-31 ENCOUNTER — Ambulatory Visit (HOSPITAL_COMMUNITY): Payer: Medicare Other | Admitting: Anesthesiology

## 2016-10-31 ENCOUNTER — Ambulatory Visit (HOSPITAL_COMMUNITY)
Admission: RE | Admit: 2016-10-31 | Discharge: 2016-10-31 | Disposition: A | Discharge status: Home / Self Care | Payer: Medicare Other | Source: Ambulatory Visit | Attending: Cardiovascular Disease | Admitting: Cardiovascular Disease

## 2016-10-31 ENCOUNTER — Ambulatory Visit (HOSPITAL_COMMUNITY)
Admission: RE | Admit: 2016-10-31 | Discharge: 2016-10-31 | Disposition: A | Discharge status: Home / Self Care | Payer: Medicare Other | Source: Ambulatory Visit | Attending: Nurse Practitioner | Admitting: Nurse Practitioner

## 2016-10-31 ENCOUNTER — Encounter (HOSPITAL_COMMUNITY)
Admission: RE | Disposition: A | Discharge status: Home / Self Care | Payer: Self-pay | Source: Ambulatory Visit | Attending: Cardiovascular Disease

## 2016-10-31 ENCOUNTER — Encounter (HOSPITAL_COMMUNITY): Payer: Self-pay | Admitting: *Deleted

## 2016-10-31 DIAGNOSIS — Z6834 Body mass index (BMI) 34.0-34.9, adult: Secondary | ICD-10-CM | POA: Insufficient documentation

## 2016-10-31 DIAGNOSIS — I4819 Other persistent atrial fibrillation: Secondary | ICD-10-CM

## 2016-10-31 DIAGNOSIS — G7000 Myasthenia gravis without (acute) exacerbation: Secondary | ICD-10-CM | POA: Insufficient documentation

## 2016-10-31 DIAGNOSIS — Z885 Allergy status to narcotic agent status: Secondary | ICD-10-CM | POA: Insufficient documentation

## 2016-10-31 DIAGNOSIS — I481 Persistent atrial fibrillation: Secondary | ICD-10-CM | POA: Insufficient documentation

## 2016-10-31 DIAGNOSIS — I10 Essential (primary) hypertension: Secondary | ICD-10-CM | POA: Insufficient documentation

## 2016-10-31 DIAGNOSIS — K219 Gastro-esophageal reflux disease without esophagitis: Secondary | ICD-10-CM | POA: Insufficient documentation

## 2016-10-31 DIAGNOSIS — Z7901 Long term (current) use of anticoagulants: Secondary | ICD-10-CM | POA: Insufficient documentation

## 2016-10-31 DIAGNOSIS — Z888 Allergy status to other drugs, medicaments and biological substances status: Secondary | ICD-10-CM | POA: Diagnosis not present

## 2016-10-31 DIAGNOSIS — Z79899 Other long term (current) drug therapy: Secondary | ICD-10-CM | POA: Diagnosis not present

## 2016-10-31 DIAGNOSIS — Z881 Allergy status to other antibiotic agents status: Secondary | ICD-10-CM | POA: Insufficient documentation

## 2016-10-31 DIAGNOSIS — E782 Mixed hyperlipidemia: Secondary | ICD-10-CM | POA: Insufficient documentation

## 2016-10-31 DIAGNOSIS — Z91041 Radiographic dye allergy status: Secondary | ICD-10-CM | POA: Insufficient documentation

## 2016-10-31 DIAGNOSIS — I2510 Atherosclerotic heart disease of native coronary artery without angina pectoris: Secondary | ICD-10-CM | POA: Insufficient documentation

## 2016-10-31 DIAGNOSIS — Z87891 Personal history of nicotine dependence: Secondary | ICD-10-CM | POA: Diagnosis not present

## 2016-10-31 DIAGNOSIS — I739 Peripheral vascular disease, unspecified: Secondary | ICD-10-CM | POA: Diagnosis not present

## 2016-10-31 HISTORY — PX: CARDIOVERSION: SHX1299

## 2016-10-31 HISTORY — PX: TEE WITHOUT CARDIOVERSION: SHX5443

## 2016-10-31 LAB — CBC
HEMATOCRIT: 42.7 % (ref 39.0–52.0)
HEMOGLOBIN: 13.9 g/dL (ref 13.0–17.0)
MCH: 30.6 pg (ref 26.0–34.0)
MCHC: 32.6 g/dL (ref 30.0–36.0)
MCV: 94.1 fL (ref 78.0–100.0)
Platelets: 172 10*3/uL (ref 150–400)
RBC: 4.54 MIL/uL (ref 4.22–5.81)
RDW: 13.9 % (ref 11.5–15.5)
WBC: 7.9 10*3/uL (ref 4.0–10.5)

## 2016-10-31 LAB — BASIC METABOLIC PANEL
ANION GAP: 10 (ref 5–15)
BUN: 18 mg/dL (ref 6–20)
CHLORIDE: 107 mmol/L (ref 101–111)
CO2: 24 mmol/L (ref 22–32)
CREATININE: 1 mg/dL (ref 0.61–1.24)
Calcium: 8.7 mg/dL — ABNORMAL LOW (ref 8.9–10.3)
GFR calc non Af Amer: >60 mL/min (ref >60)
Glucose, Bld: 95 mg/dL (ref 65–99)
POTASSIUM: 3.5 mmol/L (ref 3.5–5.1)
SODIUM: 141 mmol/L (ref 135–145)

## 2016-10-31 SURGERY — ECHOCARDIOGRAM, TRANSESOPHAGEAL
Anesthesia: General

## 2016-10-31 MED ORDER — PHENYLEPHRINE HCL 10 MG/ML IJ SOLN
INTRAMUSCULAR | Status: DC | PRN
  Administered 2016-10-31: 40 ug/min via INTRAVENOUS

## 2016-10-31 MED ORDER — SODIUM CHLORIDE 0.9 % IV SOLN
INTRAVENOUS | Status: DC
  Administered 2016-10-31: 14:00:00 via INTRAVENOUS

## 2016-10-31 MED ORDER — BUTAMBEN-TETRACAINE-BENZOCAINE 2-2-14 % EX AERO
INHALATION_SPRAY | CUTANEOUS | Status: DC | PRN
  Administered 2016-10-31: 2 via TOPICAL

## 2016-10-31 MED ORDER — PROPOFOL 10 MG/ML IV BOLUS
INTRAVENOUS | Status: DC | PRN
  Administered 2016-10-31: 100 mg via INTRAVENOUS

## 2016-10-31 MED ORDER — PROPOFOL 500 MG/50ML IV EMUL
INTRAVENOUS | Status: DC | PRN
  Administered 2016-10-31: 100 ug/kg/min via INTRAVENOUS

## 2016-10-31 NOTE — CV Procedure (Signed)
TEE/DCC: On eliquis   Propofol Anesthesia  Normal EF Moderate MR Severe LAE Smoke no LAA thrombus Normal AV Normal RV No effusion  DCC x 2 150 J then 200J Converted from afib rate 92 to SR rate 58  No immediate neurologic sequleae  Charlton Haws

## 2016-10-31 NOTE — Transfer of Care (Signed)
Immediate Anesthesia Transfer of Care Note  Patient: [name]  Procedure(s) Performed: Procedure(s): TRANSESOPHAGEAL ECHOCARDIOGRAM (TEE) (N/A) CARDIOVERSION (N/A)  Patient Location: PACU and Endoscopy Unit  Anesthesia Type:MAC  Level of Consciousness: awake, sedated and patient cooperative  Airway & Oxygen Therapy: Patient Spontanous Breathing and Patient connected to nasal cannula oxygen  Post-op Assessment: Report given to RN, Post -op Vital signs reviewed and stable, Patient moving all extremities and Patient moving all extremities X 4  Post vital signs: Reviewed and stable  Last Vitals:  Vitals:   10/31/16 1400  BP: (!) 133/100  Pulse: (!) 105  Resp: 18  Temp: 36.7 C  SpO2: 97%    Last Pain:  Vitals:   10/31/16 1400  TempSrc: Oral         Complications: No apparent anesthesia complications

## 2016-10-31 NOTE — Anesthesia Preprocedure Evaluation (Signed)
Anesthesia Evaluation  Patient identified by MRN, date of birth, ID band Patient awake    Reviewed: Allergy & Precautions, NPO status , Patient's Chart, lab work & pertinent test results  History of Anesthesia Complications Negative for: history of anesthetic complications  Airway Mallampati: II  TM Distance: >3 FB Neck ROM: Full    Dental no notable dental hx. (+) Teeth Intact, Dental Advisory Given   Pulmonary former smoker,    breath sounds clear to auscultation       Cardiovascular hypertension, + CAD and + Peripheral Vascular Disease  + dysrhythmias Atrial Fibrillation  Rhythm:Irregular Rate:Normal     Neuro/Psych myasthenia gravis  Neuromuscular disease    GI/Hepatic Neg liver ROS, GERD  Medicated and Controlled,  Endo/Other  Morbid obesity  Renal/GU negative Renal ROS     Musculoskeletal   Abdominal   Peds  Hematology   Anesthesia Other Findings   Reproductive/Obstetrics                             Anesthesia Physical Anesthesia Plan  ASA: III  Anesthesia Plan: MAC   Post-op Pain Management:    Induction: Intravenous  PONV Risk Score and Plan: 1  Airway Management Planned: Nasal Cannula  Additional Equipment: None  Intra-op Plan:   Post-operative Plan:   Informed Consent: I have reviewed the patients History and Physical, chart, labs and discussed the procedure including the risks, benefits and alternatives for the proposed anesthesia with the patient or authorized representative who has indicated his/her understanding and acceptance.   Dental advisory given  Plan Discussed with: CRNA and Surgeon  Anesthesia Plan Comments:         Anesthesia Quick Evaluation

## 2016-10-31 NOTE — Discharge Instructions (Signed)
TEE ° °YOU HAD AN CARDIAC PROCEDURE TODAY: Refer to the procedure report and other information in the discharge instructions given to you for any specific questions about what was found during the examination. If this information does not answer your questions, please call [facility] office at [phone] to clarify.  ° °DIET: Your first meal following the procedure should be a light meal and then it is ok to progress to your normal diet. A half-sandwich or bowl of soup is an example of a good first meal. Heavy or fried foods are harder to digest and may make you feel nauseous or bloated. Drink plenty of fluids but you should avoid alcoholic beverages for 24 hours. If you had a esophageal dilation, please see attached instructions for diet.  ° °ACTIVITY: Your care partner should take you home directly after the procedure. You should plan to take it easy, moving slowly for the rest of the day. You can resume normal activity the day after the procedure however YOU SHOULD NOT DRIVE, use power tools, machinery or perform tasks that involve climbing or major physical exertion for 24 hours (because of the sedation medicines used during the test).  ° °SYMPTOMS TO REPORT IMMEDIATELY: °A cardiologist can be reached at any hour. Please call [phone] for any of the following symptoms:  °Vomiting of blood or coffee ground material  °New, significant abdominal pain  °New, significant chest pain or pain under the shoulder blades  °Painful or persistently difficult swallowing  °New shortness of breath  °Black, tarry-looking or red, bloody stools ° °FOLLOW UP:  °Please also call with any specific questions about appointments or follow up tests. ° °Electrical Cardioversion, Care After °This sheet gives you information about how to care for yourself after your procedure. Your health care provider may also give you more specific instructions. If you have problems or questions, contact your health care provider. °What can I  expect after the procedure? °After the procedure, it is common to have: °· Some redness on the skin where the shocks were given. ° °Follow these instructions at home: °· Do not drive for 24 hours if you were given a medicine to help you relax (sedative). °· Take over-the-counter and prescription medicines only as told by your health care provider. °· Ask your health care provider how to check your pulse. Check it often. °· Rest for 48 hours after the procedure or as told by your health care provider. °· Avoid or limit your caffeine use as told by your health care provider. °Contact a health care provider if: °· You feel like your heart is beating too quickly or your pulse is not regular. °· You have a serious muscle cramp that does not go away. °Get help right away if: °· You have discomfort in your chest. °· You are dizzy or you feel faint. °· You have trouble breathing or you are short of breath. °· Your speech is slurred. °· You have trouble moving an arm or leg on one side of your body. °· Your fingers or toes turn cold or blue. °This information is not intended to replace advice given to you by your health care provider. Make sure you discuss any questions you have with your health care provider. °Document Released: 11/19/2012 Document Revised: 09/02/2015 Document Reviewed: 08/05/2015 °Elsevier Interactive Patient Education © 2018 [employer] ° °

## 2016-10-31 NOTE — Interval H&P Note (Signed)
History and Physical Interval Note:  10/31/2016 1:58 PM  [name]  has presented today for surgery, with the diagnosis of AFIB  The various methods of treatment have been discussed with the patient and family. After consideration of risks, benefits and other options for treatment, the patient has consented to  Procedure(s): TRANSESOPHAGEAL ECHOCARDIOGRAM (TEE) (N/A) CARDIOVERSION (N/A) as a surgical intervention .  The patient's history has been reviewed, patient examined, no change in status, stable for surgery.  I have reviewed the patient's chart and labs.  Questions were answered to the patient's satisfaction.     Charlton Haws

## 2016-10-31 NOTE — H&P (View-Only) (Signed)
 Primary Care Physician: Smith, Candace, MD Referring Physician: [address] Triage Cardiologist:Dr. Smith   [name] is a 70 y.o. male with a h/o myasthenia gravis, recently admitted with atrial fibrillation and underwent electrical cardioversion to sinus rhythm but ERAF within 72 hours.  Recurrent AF  treated with amiodarone x 3 weeks  and repeat  electrical cardioversion, again with ERAF within 49-72 hours.   Tolerating amiodarone reasonably well. Still not very symptomatic. He has a Kardia app on his phone. He has been tracking heart rate trend. Heart rates running around 110-115 bpm since amiodarone was started. He denies dyspnea.  No chest pain, orthopnea, or PND.  He is in the afib clinic for recurrent afib. Unfortunately with myasthenia gravis, CCB or BBB are not recommended for possible exacerbation of symptoms related to this. He does have a severely dilated left atrium by echo which will undermine efforts to restore SR. He continues to be minimally symptomatic. No missed doses of apixaban.  Today, he denies symptoms of palpitations, chest pain, shortness of breath, orthopnea, PND, lower extremity edema, dizziness, presyncope, syncope, or neurologic sequela. The patient is tolerating medications without difficulties and is otherwise without complaint today.   Past Medical History:  Diagnosis Date  . GERD (gastroesophageal reflux disease)   . History of Bell's palsy   . History of pericarditis   . Hypertension   . Mixed hyperlipidemia   . Myasthenia gravis (HCC)    currently in remission (11/2014)    Past Surgical History:  Procedure Laterality Date  . CARDIOVERSION N/A 09/21/2016   Procedure: CARDIOVERSION;  Surgeon: Croitoru, Mihai, MD;  Location: [facility] ENDOSCOPY;  Service: Cardiovascular;  Laterality: N/A;  . CARDIOVERSION N/A 10/17/2016   Procedure: CARDIOVERSION;  Surgeon: McLean, Dalton S, MD;  Location: [facility] ENDOSCOPY;  Service: Cardiovascular;  Laterality: N/A;    . LAMINECTOMY  1991  . TEE WITHOUT CARDIOVERSION N/A 09/21/2016   Procedure: TRANSESOPHAGEAL ECHOCARDIOGRAM (TEE);  Surgeon: Croitoru, Mihai, MD;  Location: [facility] ENDOSCOPY;  Service: Cardiovascular;  Laterality: N/A;  . TONSILLECTOMY  1950    Current Outpatient Prescriptions  Medication Sig Dispense Refill  . Alpha-Lipoic Acid 300 MG CAPS Take 300 mg by mouth daily.     . apixaban (ELIQUIS) 5 MG TABS tablet Take 1 tablet (5 mg total) by mouth 2 (two) times daily. 60 tablet 3  . atorvastatin (LIPITOR) 20 MG tablet TAKE 1 TABLET BY MOUTH DAILY 30 tablet 11  . Coenzyme Q10 (COQ-10) 100 MG CAPS Take 100 mg by mouth daily.     . Multiple Vitamins-Minerals (CENTRUM SILVER PO) Take 1 tablet by mouth daily.    . mycophenolate (CELLCEPT) 500 MG tablet Take 2 tablets (1,000 mg total) by mouth 2 (two) times daily. 120 tablet 12  . Omega-3 Fatty Acids (OMEGA-3 PO) Take 1,040 mg by mouth daily.    . pyridostigmine (MESTINON) 60 MG tablet Take 1 tablet (60 mg total) by mouth 3 (three) times daily as needed. 90 tablet 6  . ramipril (ALTACE) 5 MG capsule Take 5 mg by mouth daily.    . Resveratrol 100 MG CAPS Take 100 mg by mouth 2 (two) times daily.     . tamsulosin (FLOMAX) 0.4 MG CAPS capsule Take 0.4 mg by mouth daily.    . triamterene-hydrochlorothiazide (MAXZIDE-25) 37.5-25 MG tablet Take 2 tablets by mouth daily. 60 tablet 3  . TURMERIC PO Take 100 mg by mouth 2 (two) times daily.    . amiodarone (PACERONE) 200 MG tablet Take   1 tablet (200 mg total) by mouth 2 (two) times daily. 180 tablet 3   No current facility-administered medications for this encounter.     Allergies  Allergen Reactions  . Demerol [Meperidine]     hallucinations  . Aminoglycosides     Can not use due to mysthenia gravis  . Beta Adrenergic Blockers     Can not use due to mysthenia gravis  . Calcium Channel Blockers     Can not use due to mysthenia gravis  . Ciprofloxacin     Can not use due to mysthenia gravis  .  Iodinated Diagnostic Agents     Can not use due to mysthenia gravis  . Penicillamine     Can not use due to mysthenia gravis  . Procainamide Hcl     Can not use due to mysthenia gravis  . Quinine Derivatives     Can not use due to mysthenia gravis  . Succinylcholine Chloride     Can not use due to mysthenia gravis  . Vecuronium Bromide [Vecuronium]     Can not use due to mysthenia gravis    Social History   Social History  . Marital status: Married    Spouse name: N/A  . Number of children: 1  . Years of education: Grad Sch   Occupational History  . [employer]    Social History Main Topics  . Smoking status: Former Smoker    Types: Pipe    Quit date: 12/10/1966  . Smokeless tobacco: Never Used  . Alcohol use 0.6 oz/week    1 Glasses of wine per week  . Drug use: No  . Sexual activity: Not on file   Other Topics Concern  . Not on file   Social History Narrative   Lives at home with his wife.   Right-handed.   Occasional caffeine use.    Family History  Problem Relation Age of Onset  . Cancer Mother   . Heart disease Father   . Cancer Brother        bladder/ in remission  . Healthy Daughter        age 73    ROS- All systems are reviewed and negative except as per the HPI above  Physical Exam: Vitals:   10/22/16 1431  BP: 116/72  Pulse: (!) 118  Weight: 222 lb 9.6 oz (101 kg)  Height: 5\' 7"  (1.702 m)   Wt Readings from Last 3 Encounters:  10/22/16 222 lb 9.6 oz (101 kg)  10/17/16 217 lb (98.4 kg)  10/04/16 223 lb 3.2 oz (101.2 kg)    Labs: Lab Results  Component Value Date   NA 139 10/04/2016   K 3.9 10/04/2016   CL 102 10/04/2016   CO2 23 10/04/2016   GLUCOSE 91 10/04/2016   BUN 22 10/04/2016   CREATININE 0.93 10/04/2016   CALCIUM 9.1 10/04/2016   MG 2.1 07/06/2007   Lab Results  Component Value Date   INR 1.1 10/04/2016   Lab Results  Component Value Date   CHOL 144 09/13/2015   HDL 56 09/13/2015   LDLCALC 76  09/13/2015   TRIG 60 09/13/2015     GEN- The patient is well appearing, alert and oriented x 3 today.   Head- normocephalic, atraumatic Eyes-  Sclera clear, conjunctiva pink Ears- hearing intact Oropharynx- clear Neck- supple, no JVP Lymph- no cervical lymphadenopathy Lungs- Clear to ausculation bilaterally, normal work of breathing Heart-irregular rate and rhythm, no murmurs, rubs or gallops,  PMI not laterally displaced GI- soft, NT, ND, + BS Extremities- no clubbing, cyanosis, or edema MS- no significant deformity or atrophy Skin- no rash or lesion Psych- euthymic mood, full affect Neuro- strength and sensation are intact  EKG- afib at 118 bpm, qrs int 112 ms, qtc 487  Epic records reviewed TEE-Study Conclusions  - Left ventricle: The cavity size was normal. Wall thickness was   normal. Systolic function was at the lower limits of normal. The   estimated ejection fraction was in the range of 50% to 55%. Wall   motion was normal; there were no regional wall motion   abnormalities. No evidence of thrombus. - Aortic valve: No evidence of vegetation. No evidence of   vegetation. There was trivial regurgitation. - Mitral valve: No evidence of vegetation. There was mild to   moderate regurgitation directed centrally. - Left atrium: The atrium was severely dilated. No evidence of   thrombus in the atrial cavity or appendage. No spontaneous echo   contrast was observed. - Right atrium: The atrium was dilated. - Atrial septum: No defect or patent foramen ovale was identified. - Tricuspid valve: No evidence of vegetation. No evidence of   vegetation. - Pulmonic valve: No evidence of vegetation.  Impressions:  - Successful cardioversion.   Assessment and Plan: 1. Persistent afib Failed DCCV alone and with amiodarone Discussed withDr. Allred Before we say amiodarone is a failure, he advised to increase amio to 400 mg bid and try cardioversion again in about 7-10 days. Pt  has not had any missed doses of eliquis Unfortunately, it is not advised by PharmD to add rate control, BB or CCB with myasthenia gravis  I worry severly dilated left atrium is undermining efforts to restore SR  [name] C. [facility] [address] [place], [place] [address] [phone]  Addendum- 9/18- After my note was completed, pt called the office reporting that he had missed a dose of eliquis the week before. He did not want any additional wait  for cardioversion to meet the 21 days of uninterrupted days of eliquis  so he was agreeable to add TEE to DCCV.

## 2016-10-31 NOTE — Progress Notes (Signed)
  Echocardiogram Echocardiogram Transesophageal has been performed.  Nolon Rod 10/31/2016, 3:27 PM

## 2016-10-31 NOTE — Progress Notes (Signed)
Patient reports no further hematuria since calling over weekend. He does have an appointment set up with urology later this week. Reminded he will not be able to stop DOAC for at least 30 days after cardioversion.

## 2016-11-01 ENCOUNTER — Encounter (HOSPITAL_COMMUNITY): Payer: Self-pay | Admitting: Cardiovascular Disease

## 2016-11-01 NOTE — Anesthesia Postprocedure Evaluation (Addendum)
Anesthesia Post Note  Patient: [name]  Procedure(s) Performed: Procedure(s) (LRB): TRANSESOPHAGEAL ECHOCARDIOGRAM (TEE) (N/A) CARDIOVERSION (N/A)     Patient location during evaluation: Endoscopy Anesthesia Type: General Level of consciousness: awake and alert Pain management: pain level controlled Vital Signs Assessment: post-procedure vital signs reviewed and stable Respiratory status: spontaneous breathing, nonlabored ventilation, respiratory function stable and patient connected to nasal cannula oxygen Cardiovascular status: blood pressure returned to baseline and stable Postop Assessment: no apparent nausea or vomiting Anesthetic complications: no    Last Vitals:  Vitals:   10/31/16 1550 10/31/16 1555  BP: 119/81   Pulse: 61 60  Resp: (!) 24 13  Temp:    SpO2: 97% 98%    Last Pain:  Vitals:   10/31/16 1516  TempSrc: Oral                 [name]

## 2016-11-05 ENCOUNTER — Telehealth: Payer: Self-pay | Admitting: Interventional Cardiology

## 2016-11-05 NOTE — Telephone Encounter (Signed)
Spoke with patient and he is back out of rhythm, still asymptotic.  Resting heart rate ranging from 85-120. Spoke with Elvina Sidle NP and she recommended decreasing Amiodarone 200 mg to once a day and follow up with EP soon. Spoke with patient and he is agreeable to plan. Will send message to [facility], scheduler about getting patient appointment soon.

## 2016-11-05 NOTE — Telephone Encounter (Signed)
New message    Pt is calling asking for a call back. He said he had a procedure on 9/19 and his heart was in rhythm until an hour ago. He is asking for a call back. Please call.

## 2016-11-06 NOTE — Telephone Encounter (Signed)
Spoke with Efraim Kaufmann T scheduler for EP this am, she is working on appointment for patient. Spoke with patient and he stated he was about the same. Explained that we are still working on appointment, appreciated follow up

## 2016-11-08 NOTE — Telephone Encounter (Signed)
Patient scheduled 11/12/16 with Dr Elberta Fortis

## 2016-11-11 NOTE — Progress Notes (Signed)
Electrophysiology Office Note   Date:  11/12/2016   ID:  [name], DOB Jul 22, 1945, MRN [id number]  PCP:  [name], MD  Cardiologist:  Katrinka Blazing Primary Electrophysiologist:  Luvia Orzechowski Jorja Loa, MD    Chief Complaint  Patient presents with  . Advice Only    Persistent Afib     History of Present Illness: [name] is a 71 y.o. male who is being seen today for the evaluation of atrial fibrillation at the request of [name], NP. Presenting today for electrophysiology evaluation. He has history of myasthenia gravis, and atrial fibrillation who underwent cardioversion to sinus rhythm but with ERAF within 72 hours. He was treated with amiodarone for 3 weeks and had repeat cardioversion but had early return of A. fib after 72 hours. His heart rates have been 110-1 15 bpm since amiodarone was started. He tracks this on the [facility] [place] app on his phone.   Today, he denies symptoms of chest pain, orthopnea, PND, lower extremity edema, claudication, dizziness, presyncope, syncope, bleeding, or neurologic sequela. The patient is tolerating medications without difficulties. His main symptoms are shortness of breath. She mainly gets short of breath when he walks up stairs. Walking up one flight of stairs feels like 3 flights to him. He also has palpitations and fatigue.   Past Medical History:  Diagnosis Date  . GERD (gastroesophageal reflux disease)   . History of Bell's palsy   . History of pericarditis   . Hypertension   . Mixed hyperlipidemia   . Myasthenia gravis (HCC)    currently in remission (11/2014)    Past Surgical History:  Procedure Laterality Date  . CARDIOVERSION N/A 09/21/2016   Procedure: CARDIOVERSION;  Surgeon: Thurmon Fair, MD;  Location: [facility] ENDOSCOPY;  Service: Cardiovascular;  Laterality: N/A;  . CARDIOVERSION N/A 10/17/2016   Procedure: CARDIOVERSION;  Surgeon: Laurey Morale, MD;  Location[facility] ENDOSCOPY;  Service: Cardiovascular;   Laterality: N/A;  . CARDIOVERSION N/A 10/31/2016   Procedure: CARDIOVERSION;  Surgeon: Wendall Stade, MD;  Location: [facility] ENDOSCOPY;  Service: Cardiovascular;  Laterality: N/A;  . LAMINECTOMY  1991  . TEE WITHOUT CARDIOVERSION N/A 09/21/2016   Procedure: TRANSESOPHAGEAL ECHOCARDIOGRAM (TEE);  Surgeon: Thurmon Fair, MD;  Location: [facility] ENDOSCOPY;  Service: Cardiovascular;  Laterality: N/A;  . TEE WITHOUT CARDIOVERSION N/A 10/31/2016   Procedure: TRANSESOPHAGEAL ECHOCARDIOGRAM (TEE);  Surgeon: Wendall Stade, MD;  Location: [facility] ENDOSCOPY;  Service: Cardiovascular;  Laterality: N/A;  . TONSILLECTOMY  1950     Current Outpatient Prescriptions  Medication Sig Dispense Refill  . Alpha-Lipoic Acid 300 MG CAPS Take 300 mg by mouth daily.     Marland Kitchen apixaban (ELIQUIS) 5 MG TABS tablet Take 1 tablet (5 mg total) by mouth 2 (two) times daily. 60 tablet 3  . atorvastatin (LIPITOR) 20 MG tablet TAKE 1 TABLET BY MOUTH DAILY 30 tablet 11  . Coenzyme Q10 (COQ-10) 100 MG CAPS Take 100 mg by mouth daily.     . Multiple Vitamins-Minerals (CENTRUM SILVER PO) Take 1 tablet by mouth daily.    . mycophenolate (CELLCEPT) 500 MG tablet Take 2 tablets (1,000 mg total) by mouth 2 (two) times daily. 120 tablet 12  . Omega-3 Fatty Acids (OMEGA-3 PO) Take 1,040 mg by mouth daily.    Marland Kitchen pyridostigmine (MESTINON) 60 MG tablet Take 1 tablet (60 mg total) by mouth 3 (three) times daily as needed. 90 tablet 6  . ramipril (ALTACE) 5 MG capsule Take 5 mg by mouth daily.    Marland Kitchen  Resveratrol 100 MG CAPS Take 100 mg by mouth 2 (two) times daily.     . tamsulosin (FLOMAX) 0.4 MG CAPS capsule Take 0.4 mg by mouth daily.    Marland Kitchen triamterene-hydrochlorothiazide (MAXZIDE-25) 37.5-25 MG tablet Take 2 tablets by mouth daily. 60 tablet 3  . TURMERIC PO Take 100 mg by mouth 2 (two) times daily.     No current facility-administered medications for this visit.     Allergies:   Demerol [meperidine]; Aminoglycosides; Beta adrenergic blockers; Calcium  channel blockers; Ciprofloxacin; Iodinated diagnostic agents; Penicillamine; Procainamide hcl; Quinine derivatives; Succinylcholine chloride; and Vecuronium bromide [vecuronium]   Social History:  The patient  reports that he quit smoking about 49 years ago. His smoking use included Pipe. He has never used smokeless tobacco. He reports that he drinks about 0.6 oz of alcohol per week . He reports that he does not use drugs.   Family History:  The patient's family history includes Cancer in his brother and mother; Healthy in his daughter; Heart disease in his father.    ROS:  Please see the history of present illness.   Otherwise, review of systems is positive for palpitations, blood in urine.   All other systems are reviewed and negative.    PHYSICAL EXAM: VS:  BP 102/72   Pulse (!) 115   Ht  (1.702 m)   Wt 224 lb (101.6 kg)   BMI 35.08 kg/m  , BMI Body mass index is 35.08 kg/m. GEN: Well nourished, well developed, in no acute distress  HEENT: normal  Neck: no JVD, carotid bruits, or masses Cardiac: iRRR; no murmurs, rubs, or gallops,no edema  Respiratory:  clear to auscultation bilaterally, normal work of breathing GI: soft, nontender, nondistended, + BS MS: no deformity or atrophy  Skin: warm and dry Neuro:  Strength and sensation are intact Psych: euthymic mood, full affect  EKG:  EKG is ordered today. Personal review of the ekg ordered shows atrial fibrillation, rate 115  Recent Labs: 03/26/2016: TSH 1.170 09/20/2016: ALT 24; B Natriuretic Peptide 319.2 10/31/2016: BUN 18; Creatinine, Ser 1.00; Hemoglobin 13.9; Platelets 172; Potassium 3.5; Sodium 141    Lipid Panel     Component Value Date/Time   CHOL 144 09/13/2015 1041   CHOL 218 (H) 12/14/2014 0915   TRIG 60 09/13/2015 1041   TRIG 77 12/14/2014 0915   HDL 56 09/13/2015 1041   HDL 52 12/14/2014 0915   CHOLHDL 2.6 09/13/2015 1041   VLDL 12 09/13/2015 1041   LDLCALC 76 09/13/2015 1041   LDLCALC 151 (H)  12/14/2014 0915     Wt Readings from Last 3 Encounters:  11/12/16 224 lb (101.6 kg)  10/31/16 222 lb 9.6 oz (101 kg)  10/22/16 222 lb 9.6 oz (101 kg)      Other studies Reviewed: Additional studies/ records that were reviewed today include: TEE 09/21/16  Review of the above records today demonstrates:  - Left ventricle: The cavity size was normal. Wall thickness was   normal. Systolic function was at the lower limits of normal. The   estimated ejection fraction was in the range of 50% to 55%. Wall   motion was normal; there were no regional wall motion   abnormalities. No evidence of thrombus. - Aortic valve: No evidence of vegetation. No evidence of   vegetation. There was trivial regurgitation. - Mitral valve: No evidence of vegetation. There was mild to   moderate regurgitation directed centrally. - Left atrium: The atrium was severely dilated. No evidence  of   thrombus in the atrial cavity or appendage. No spontaneous echo   contrast was observed. - Right atrium: The atrium was dilated. - Atrial septum: No defect or patent foramen ovale was identified. - Tricuspid valve: No evidence of vegetation. No evidence of   vegetation. - Pulmonic valve: No evidence of vegetation.   ASSESSMENT AND PLAN:  1.  Persistent atrial fibrillation: Anticoagulation with Eliquis currently on amiodarone. Has failed multiple cardioversions. Unfortunately his left atrium is severely dilated, and thus his odds of returning to sinus rhythm are significantly decreased. I'm worried that he Erika Hussar not return to sinus rhythm. He Earnstine Meinders prefer not to have an AV nodal ablation and pacemaker implantation but I feel that this is the direction we are going. We'll work to start him on digoxin today to see if this Braidon [place] help with rate control.  This patients CHA2DS2-VASc Score and unadjusted Ischemic Stroke Rate (% per year) is equal to 3.2 % stroke rate/year from a score of 3  Above score calculated as 1 point each  if present [CHF, HTN, DM, Vascular=MI/PAD/Aortic Plaque, Age if 65-74, or Male] Above score calculated as 2 points each if present [Age > 75, or Stroke/TIA/TE]  2. Hypertension: Well-controlled. No changes.  3. Hyperlipidemia: Continue current management    Current medicines are reviewed at length with the patient today.   The patient does not have concerns regarding his medicines.  The following changes were made today:  none  Labs/ tests ordered today include:  Orders Placed This Encounter  Procedures  . EKG 12-Lead     Disposition:   FU with Rontae Inglett 1 months  Signed, Wilmot Quevedo Jorja Loa, MD  11/12/2016 9:10 AM     [facility] [address] [place] [address] [phone] (office) [phone] (fax)

## 2016-11-12 ENCOUNTER — Encounter: Payer: Self-pay | Admitting: Cardiology

## 2016-11-12 ENCOUNTER — Ambulatory Visit (INDEPENDENT_AMBULATORY_CARE_PROVIDER_SITE_OTHER): Payer: Medicare Other | Admitting: Cardiology

## 2016-11-12 VITALS — BP 102/72 | HR 115 | Ht 67.0 in | Wt 224.0 lb

## 2016-11-12 DIAGNOSIS — I4819 Other persistent atrial fibrillation: Secondary | ICD-10-CM

## 2016-11-12 DIAGNOSIS — Z79899 Other long term (current) drug therapy: Secondary | ICD-10-CM

## 2016-11-12 DIAGNOSIS — E785 Hyperlipidemia, unspecified: Secondary | ICD-10-CM

## 2016-11-12 DIAGNOSIS — I481 Persistent atrial fibrillation: Secondary | ICD-10-CM | POA: Diagnosis not present

## 2016-11-12 DIAGNOSIS — I10 Essential (primary) hypertension: Secondary | ICD-10-CM | POA: Diagnosis not present

## 2016-11-12 MED ORDER — DIGOXIN 250 MCG PO TABS: 0.2500 mg | ORAL_TABLET | Freq: Every day | ORAL | 1 refills | Status: DC

## 2016-11-12 NOTE — Patient Instructions (Addendum)
Medication Instructions:  Your physician has recommended you make the following change in your medication: 1. STOP Amiodarone 2. START Digoxin 0.25 mg once daily  -- If you need a refill on your cardiac medications before your next appointment, please call your pharmacy. --  Labwork: Your physician recommends that you return for lab work in: 3 weeks for Digoxin level  Testing/Procedures: None ordered  Follow-Up: Your physician recommends that you schedule a follow-up appointment in: 1 month with Dr. Elberta Fortis   Thank you for choosing [facility]!!   Dory Horn, RN [phone]  Any Other Special Instructions Will Be Listed Below (If Applicable).  Digoxin tablets or capsules What is this medicine? DIGOXIN (di JOX in) is used to treat congestive heart failure and heart rhythm problems. This medicine may be used for other purposes; ask your health care provider or pharmacist if you have questions. COMMON BRAND NAME(S): Digitek, Lanoxicaps, Lanoxin What should I tell my health care provider before I take this medicine? They need to know if you have any of these conditions: -certain heart rhythm disorders -heart disease or recent heart attack -kidney or liver disease -an unusual or allergic reaction to digoxin, other medicines, foods, dyes, or preservatives -pregnant or trying to get pregnant -breast-feeding How should I use this medicine? Take this medicine by mouth with a glass of water. Follow the directions on the prescription label. Take your doses at regular intervals. Do not take your medicine more often than directed. Talk to your pediatrician regarding the use of this medicine in children. Special care may be needed. Overdosage: If you think you have taken too much of this medicine contact a poison control center or emergency room at once. NOTE: This medicine is only for you. Do not share this medicine with others. What if I miss a dose? If you miss a dose, take  it as soon as you can. If it is almost time for your next dose, take only that dose. Do not take double or extra doses. What may interact with this medicine? -activated charcoal -albuterol -alprazolam -antacids -antiviral medicines for HIV or AIDS like ritonavir and saquinavir -calcium -certain antibiotics like azithromycin, clarithromycin, erythromycin, gentamicin, neomycin, trimethoprim, and tetracycline -certain medicines for blood pressure, heart disease, irregular heart beat -certain medicines for cancer -certain medicines for cholesterol like atorvastatin, cholestyramine, and colestipol -certain medicines for diabetes, like acarbose, exenatide, miglitol, and metformin -certain medicines for fungal infections like ketoconazole and itraconazole -certain medicines for stomach problems like omeprazole, esomeprazole, lansoprazole, rabeprazole, metoclopramide, and sucralfate -conivaptan -cyclosporine -diphenoxylate -epinephrine -kaolin; pectin -nefazodone -NSAIDS, medicines for pain and inflammation, like celecoxib, ibuprofen, or naproxen -penicillamine -phenytoin -propantheline -quinine -phenytoin -rifampin -succinylcholine -St. John's Wort -sulfasalazine -teriparatide -thyroid hormones -tolvaptan This list may not describe all possible interactions. Give your health care provider a list of all the medicines, herbs, non-prescription drugs, or dietary supplements you use. Also tell them if you smoke, drink alcohol, or use illegal drugs. Some items may interact with your medicine. What should I watch for while using this medicine? Visit your doctor or health care professional for regular checks on your progress. Do not stop taking this medicine without the advice of your doctor or health care professional, even if you feel better. Do not change the brand you are taking, other brands may affect you differently. Check your heart rate and blood pressure regularly while you are taking  this medicine. Ask your doctor or health care professional what your heart rate and blood pressure should  be, and when you should contact him or her. Your doctor or health care professional also may schedule regular blood tests and electrocardiograms to check your progress. Watch your diet. Less digoxin may be absorbed from the stomach if you have a diet high in bran fiber. Do not treat yourself for coughs, colds or allergies without asking your doctor or health care professional for advice. Some ingredients can increase possible side effects. What side effects may I notice from receiving this medicine? Side effects that you should report to your doctor or health care professional as soon as possible: -allergic reactions like skin rash, itching or hives, swelling of the face, lips, or tongue -changes in behavior, mood, or mental ability -changes in vision -confusion -fast, irregular heartbeat -feeling faint or lightheaded, falls -headache -nausea, vomiting -unusual bleeding, bruising -unusually weak or tired Side effects that usually do not require medical attention (report to your doctor or health care professional if they continue or are bothersome): -breast enlargement in men and women -diarrhea This list may not describe all possible side effects. Call your doctor for medical advice about side effects. You may report side effects to FDA at 1-800-FDA-1088. Where should I keep my medicine? Keep out of the reach of children. Store at room temperature between 15 and 30 degrees C (59 and 86 degrees F). Protect from light and moisture. Throw away any unused medicine after the expiration date. NOTE: This sheet is a summary. It may not cover all possible information. If you have questions about this medicine, talk to your doctor, pharmacist, or health care provider.  2018 Elsevier/Gold Standard (2016-01-18 15:40:26)

## 2016-11-13 ENCOUNTER — Telehealth: Payer: Self-pay

## 2016-11-13 NOTE — Telephone Encounter (Signed)
I have completed a Digoxin PA through [employer]mymeds.

## 2016-11-13 NOTE — Telephone Encounter (Signed)
Digoxin PA has been approved by [employer]. Approval good until 02/11/18. Reference# [id number]

## 2016-11-16 ENCOUNTER — Ambulatory Visit: Payer: Medicare Other | Admitting: Interventional Cardiology

## 2016-11-28 ENCOUNTER — Ambulatory Visit (INDEPENDENT_AMBULATORY_CARE_PROVIDER_SITE_OTHER): Payer: Medicare Other | Admitting: Neurology

## 2016-11-28 ENCOUNTER — Encounter: Payer: Self-pay | Admitting: Neurology

## 2016-11-28 VITALS — BP 104/88 | HR 77 | Ht 67.0 in | Wt 215.5 lb

## 2016-11-28 DIAGNOSIS — G7000 Myasthenia gravis without (acute) exacerbation: Secondary | ICD-10-CM | POA: Diagnosis not present

## 2016-11-28 DIAGNOSIS — I480 Paroxysmal atrial fibrillation: Secondary | ICD-10-CM

## 2016-11-28 DIAGNOSIS — I482 Chronic atrial fibrillation, unspecified: Secondary | ICD-10-CM | POA: Insufficient documentation

## 2016-11-28 MED ORDER — MYCOPHENOLATE MOFETIL 500 MG PO TABS: 1000.0000 mg | ORAL_TABLET | Freq: Two times a day (BID) | ORAL | 4 refills | Status: DC

## 2016-11-28 NOTE — Progress Notes (Signed)
PATIENT: [name] A Parks DOB: 1945-07-03  Chief Complaint  Patient presents with  . Myasthenia Gravis    No new concerns today with his Myasthenia Gravis.  He has continued taking both CellCept and Mestinon.  He was diagnosed with a-fib in August 2018.  He is taking Eliquis and Digoxin for treatment.  He has underwent three cardioversions with only temporary correction.  States his next steps would be a radiofrequency ablation or a pacemaker.      HISTORICAL  [name] A Cichowski 71 years old right-handed, accompanied by his wife, seen in refer by primary care physician Dr.  Merri Brunetteandace Smith for evaluation of weakness, initial evaluation was March 26 2016.  He was a patient of our clinicIn the past, most recent visit was in October 2011, he had history of acetylcholine receptor antibody positive myasthenia gravis, hypertension,   Myasthenia gravis, he presented with excessive fatigue, intermittent ptosis in June 2006, at its worst, 2 months after symptom onset, he developed mild breathing difficulty, head dropped, upper and lower extremity weakness, the diagnosis is confirmed by positive acetylcholine receptor antibody, single fiber EMG of right extensor digitorum brevis, frontalis, orbicularis oris oculi by [facility] Dimas [facility]ard [facility], [place] of the chest fail to demonstrate abnormality.  He responded very well to CellCept 1000 mg twice a day, quick improvement within 2 months after he received CellCept treatment, never was treated with prednisone, for a while, he required as needed Mestinon, he was eventually able to taper off CellCept at the end of 2011 with no recurrent symptoms.,   Around December 2017, he noticed he gets fatigue easily, on February 08 2016, while he walking dogs, he notice shortness of breath after 1 mile, chest heaviness, symptoms has been persistent since then, he denies difficulty breathing in a resting position, no chewing swallowing difficulty no double vision,  wife noticed he did not fatigued pole left ptosis, he also noticed weakness in his arms, feel like he has worked out hard,  He has prostate hypertrophy since 2015, was treated with alpha 2 agonist Flomax, and acetylcholine receptor agonist tolterodine  UPDATE May 23 2016: He is now taking Cellcept 500mg  2 tab twice a day, he has no double vision, mestinon 60mg  1/2 tab bid prn,   He walks his dog 5 miles a day without much difficulty  I reviewed the laboratory evaluation in February 2018: Elevated acetylcholine binding antibody 22.8, blocking antibody 34, normal TSH 1.17, CMP creatinine of 0.71, CBC, hemoglobin 14.7,  UPDATE Nov 28 2016: He was diagnosed with atrial fibrillation since summer of 2018, he had transesophageal echocardiogram cardial conversion three time, but he is still in atrial fibrillation.   He is a potential candidate for pacemaker or radiofrequency treatment, but needs to have general anesthesia, intubation.   He is now taking cellcept 500mg  2 tab bid, he does not need mestinon. He denies ocular or bulbar symptoms.  REVIEW OF SYSTEMS: Full 14 system review of systems performed and notable only for  atrial fibrillation, urgency ALLERGIES: Allergies  Allergen Reactions  . Demerol [Meperidine]     hallucinations  . Aminoglycosides     Can not use due to mysthenia gravis  . Beta Adrenergic Blockers     Can not use due to mysthenia gravis  . Calcium Channel Blockers     Can not use due to mysthenia gravis  . Ciprofloxacin     Can not use due to mysthenia gravis  . Iodinated Diagnostic Agents  Can not use due to mysthenia gravis  . Penicillamine     Can not use due to mysthenia gravis  . Procainamide Hcl     Can not use due to mysthenia gravis  . Quinine Derivatives     Can not use due to mysthenia gravis  . Succinylcholine Chloride     Can not use due to mysthenia gravis  . Vecuronium Bromide [Vecuronium]     Can not use due to mysthenia gravis    HOME  MEDICATIONS: Current Outpatient Prescriptions  Medication Sig Dispense Refill  . Alpha-Lipoic Acid 300 MG CAPS Take 300 mg by mouth daily.     Marland Kitchen apixaban (ELIQUIS) 5 MG TABS tablet Take 1 tablet (5 mg total) by mouth 2 (two) times daily. 60 tablet 3  . atorvastatin (LIPITOR) 20 MG tablet TAKE 1 TABLET BY MOUTH DAILY 30 tablet 11  . Coenzyme Q10 (COQ-10) 100 MG CAPS Take 100 mg by mouth daily.     . digoxin (LANOXIN) 0.25 MG tablet Take 1 tablet (0.25 mg total) by mouth daily. 30 tablet 1  . Multiple Vitamins-Minerals (CENTRUM SILVER PO) Take 1 tablet by mouth daily.    . mycophenolate (CELLCEPT) 500 MG tablet Take 2 tablets (1,000 mg total) by mouth 2 (two) times daily. 120 tablet 12  . Omega-3 Fatty Acids (OMEGA-3 PO) Take 1,040 mg by mouth daily.    Marland Kitchen pyridostigmine (MESTINON) 60 MG tablet Take 1 tablet (60 mg total) by mouth 3 (three) times daily as needed. 90 tablet 6  . ramipril (ALTACE) 5 MG capsule Take 5 mg by mouth daily.    Marland Kitchen Resveratrol 100 MG CAPS Take 100 mg by mouth 2 (two) times daily.     . tamsulosin (FLOMAX) 0.4 MG CAPS capsule Take 0.4 mg by mouth daily.    Marland Kitchen triamterene-hydrochlorothiazide (MAXZIDE-25) 37.5-25 MG tablet Take 2 tablets by mouth daily. 60 tablet 3  . TURMERIC PO Take 100 mg by mouth 2 (two) times daily.     No current facility-administered medications for this visit.     PAST MEDICAL HISTORY: Past Medical History:  Diagnosis Date  . GERD (gastroesophageal reflux disease)   . History of Bell's palsy   . History of pericarditis   . Hypertension   . Mixed hyperlipidemia   . Myasthenia gravis (HCC)    currently in remission (11/2014)     PAST SURGICAL HISTORY: Past Surgical History:  Procedure Laterality Date  . CARDIOVERSION N/A 09/21/2016   Procedure: CARDIOVERSION;  Surgeon: Thurmon Fair, MD;  Location: [facility] ENDOSCOPY;  Service: Cardiovascular;  Laterality: N/A;  . CARDIOVERSION N/A 10/17/2016   Procedure: CARDIOVERSION;  Surgeon: Laurey Morale, MD;  Location: [facility] ENDOSCOPY;  Service: Cardiovascular;  Laterality: N/A;  . CARDIOVERSION N/A 10/31/2016   Procedure: CARDIOVERSION;  Surgeon: Wendall Stade, MD;  Location: [facility] ENDOSCOPY;  Service: Cardiovascular;  Laterality: N/A;  . LAMINECTOMY  1991  . TEE WITHOUT CARDIOVERSION N/A 09/21/2016   Procedure: TRANSESOPHAGEAL ECHOCARDIOGRAM (TEE);  Surgeon: Thurmon Fair, MD;  Location: [facility] ENDOSCOPY;  Service: Cardiovascular;  Laterality: N/A;  . TEE WITHOUT CARDIOVERSION N/A 10/31/2016   Procedure: TRANSESOPHAGEAL ECHOCARDIOGRAM (TEE);  Surgeon: Wendall Stade, MD;  Location: [facility] ENDOSCOPY;  Service: Cardiovascular;  Laterality: N/A;  . TONSILLECTOMY  1950    FAMILY HISTORY: Family History  Problem Relation Age of Onset  . Cancer Mother   . Heart disease Father   . Cancer Brother        bladder/ in remission  .  Healthy Daughter        age 68    SOCIAL HISTORY:  Social History   Social History  . Marital status: Married    Spouse name: N/A  . Number of children: 1  . Years of education: Grad Sch   Occupational History  . [employer]    Social History Main Topics  . Smoking status: Former Smoker    Types: Pipe    Quit date: 12/10/1966  . Smokeless tobacco: Never Used  . Alcohol use 0.6 oz/week    1 Glasses of wine per week  . Drug use: No  . Sexual activity: Not on file   Other Topics Concern  . Not on file   Social History Narrative   Lives at home with his wife.   Right-handed.   Occasional caffeine use.     PHYSICAL EXAM   Vitals:   11/28/16 0926  BP: 104/88  Pulse: 77  Weight: 215 lb 8 oz (97.8 kg)  Height: 5\' 7"  (1.702 m)    Not recorded      Body mass index is 33.75 kg/m.  PHYSICAL EXAMNIATION:  Gen: NAD, conversant, well nourised, obese, well groomed                     Cardiovascular: Regular rate rhythm, no peripheral edema, warm, nontender. Eyes: Conjunctivae clear without exudates or hemorrhage Neck: Supple, no carotid  bruits. Pulmonary: Clear to auscultation bilaterally   NEUROLOGICAL EXAM:  MENTAL STATUS: Speech:    Speech is normal; fluent and spontaneous with normal comprehension.  Cognition:     Orientation to time, place and person     Normal recent and remote memory     Normal Attention span and concentration     Normal Language, naming, repeating,spontaneous speech     Fund of knowledge   CRANIAL NERVES: CN II: Visual fields are full to confrontation. Fundoscopic exam is normal with sharp discs and no vascular changes. Pupils are round equal and briskly reactive to light. CN III, IV, VI: extraocular movement are normal. No ptosis.  Cover and uncover test showed bilateral exophoria. CN V: Facial sensation is intact to pinprick in all 3 divisions bilaterally. Corneal responses are intact.  CN VII: Face is symmetric with normal eye closure and smile. CN VIII: Hearing is normal to rubbing fingers CN IX, X: Palate elevates symmetrically. Phonation is normal. CN XI: Head turning and shoulder shrug are intact CN XII: Tongue is midline with normal movements and no atrophy.  MOTOR: I was not able to detect any neck flexion, proximal upper and lower extremity muscle weakness.  REFLEXES: Reflexes are 2+ and symmetric at the biceps, triceps, knees, and ankles. Plantar responses are flexor.  SENSORY: Intact to light touch, pinprick, positional sensation and vibratory sensation are intact in fingers and toes.  COORDINATION: Rapid alternating movements and fine finger movements are intact. There is no dysmetria on finger-to-nose and heel-knee-shin.    GAIT/STANCE: Posture is normal. Gait is steady with normal steps, base, arm swing, and turning. Heel and toe walking are normal. Tandem gait is normal.  Romberg is absent.   DIAGNOSTIC DATA (LABS, IMAGING, TESTING) - I reviewed patient records, labs, notes, testing and imaging myself where available.   ASSESSMENT AND PLAN  LESS WOOLSEY  is a 71 y.o. male   Seropositive generalized myasthenia gravis , recurrent weakness since December 2017, Newly diagnosis of atrial fibrillation  His complains of generalized weakness, intermittent fatigue could be  related to his atrial fibrillation  Currently his myasthenia gravis is under excellent control taking CellCept 500 mg 2 tablets twice a day  There is no evidence of ocular, bulbar, limb muscle weakness.  He should be able to tolerate general anesthesia, intubation for pending cardiac Procedures, But neuromuscular blocking agent should be avoided.    Levert Feinstein, M.D. Ph.D.  [facility] [address] [place], [place] [address] Ph: [phone] Fax: [phone]  CC: [name],

## 2016-12-03 ENCOUNTER — Other Ambulatory Visit: Payer: Medicare Other | Admitting: *Deleted

## 2016-12-03 DIAGNOSIS — Z79899 Other long term (current) drug therapy: Secondary | ICD-10-CM

## 2016-12-03 DIAGNOSIS — I4819 Other persistent atrial fibrillation: Secondary | ICD-10-CM

## 2016-12-03 LAB — DIGOXIN LEVEL: DIGOXIN, SERUM: 1 ng/mL — AB (ref 0.5–0.9)

## 2016-12-13 ENCOUNTER — Encounter (INDEPENDENT_AMBULATORY_CARE_PROVIDER_SITE_OTHER): Payer: Medicare Other | Admitting: Ophthalmology

## 2016-12-13 DIAGNOSIS — H353111 Nonexudative age-related macular degeneration, right eye, early dry stage: Secondary | ICD-10-CM | POA: Diagnosis not present

## 2016-12-13 DIAGNOSIS — H2513 Age-related nuclear cataract, bilateral: Secondary | ICD-10-CM

## 2016-12-13 DIAGNOSIS — H43813 Vitreous degeneration, bilateral: Secondary | ICD-10-CM | POA: Diagnosis not present

## 2016-12-13 DIAGNOSIS — H35033 Hypertensive retinopathy, bilateral: Secondary | ICD-10-CM | POA: Diagnosis not present

## 2016-12-13 DIAGNOSIS — I10 Essential (primary) hypertension: Secondary | ICD-10-CM | POA: Diagnosis not present

## 2016-12-13 DIAGNOSIS — H353221 Exudative age-related macular degeneration, left eye, with active choroidal neovascularization: Secondary | ICD-10-CM | POA: Diagnosis not present

## 2016-12-19 ENCOUNTER — Ambulatory Visit: Payer: Medicare Other | Admitting: Cardiology

## 2016-12-19 ENCOUNTER — Encounter: Payer: Self-pay | Admitting: Cardiology

## 2016-12-19 VITALS — BP 90/68 | HR 60 | Ht 67.0 in | Wt 211.6 lb

## 2016-12-19 DIAGNOSIS — I4819 Other persistent atrial fibrillation: Secondary | ICD-10-CM

## 2016-12-19 DIAGNOSIS — E785 Hyperlipidemia, unspecified: Secondary | ICD-10-CM | POA: Diagnosis not present

## 2016-12-19 DIAGNOSIS — I10 Essential (primary) hypertension: Secondary | ICD-10-CM | POA: Diagnosis not present

## 2016-12-19 DIAGNOSIS — I481 Persistent atrial fibrillation: Secondary | ICD-10-CM

## 2016-12-19 NOTE — Progress Notes (Signed)
Electrophysiology Office Note   Date:  12/19/2016   ID:  CHRISTOPHOR EICK, DOB 30-Jan-1946, MRN [id number]  PCP:  [name], MD  Cardiologist:  Katrinka Blazing Primary Electrophysiologist:  Ramone Gander Jorja Loa, MD    Chief Complaint  Patient presents with  . Follow-up    Persistent Afib     History of Present Illness: [name] is a 71 y.o. male who is being seen today for the evaluation of atrial fibrillation at the request of [name], MD. Presenting today for electrophysiology evaluation. He has history of myasthenia gravis, and atrial fibrillation who underwent cardioversion to sinus rhythm but with ERAF within 72 hours. He was treated with amiodarone for 3 weeks and had repeat cardioversion but had early return of A. fib after 72 hours. His heart rates have been 110-1 15 bpm since amiodarone was started. He tracks this on the [employer] app on his phone.  Today, denies symptoms of palpitations, chest pain, shortness of breath, orthopnea, PND, lower extremity edema, claudication, dizziness, presyncope, syncope, bleeding, or neurologic sequela. The patient is tolerating medications without difficulties.  Continues to have mild shortness of breath with exertion.  He has noted on his cardiac That his atrial fibrillation has been better controlled.  His heart rate gets into the 120s at times but is generally in the 60s-70s.   Past Medical History:  Diagnosis Date  . GERD (gastroesophageal reflux disease)   . History of Bell's palsy   . History of pericarditis   . Hypertension   . Mixed hyperlipidemia   . Myasthenia gravis (HCC)    currently in remission (11/2014)    Past Surgical History:  Procedure Laterality Date  . LAMINECTOMY  1991  . TONSILLECTOMY  1950     Current Outpatient Medications  Medication Sig Dispense Refill  . Alpha-Lipoic Acid 300 MG CAPS Take 300 mg by mouth daily.     Marland Kitchen apixaban (ELIQUIS) 5 MG TABS tablet Take 1 tablet (5 mg total) by mouth 2  (two) times daily. 60 tablet 3  . atorvastatin (LIPITOR) 20 MG tablet TAKE 1 TABLET BY MOUTH DAILY 30 tablet 11  . Coenzyme Q10 (COQ-10) 100 MG CAPS Take 100 mg by mouth daily.     . digoxin (LANOXIN) 0.25 MG tablet Take 1 tablet (0.25 mg total) by mouth daily. 30 tablet 1  . Multiple Vitamins-Minerals (CENTRUM SILVER PO) Take 1 tablet by mouth daily.    . mycophenolate (CELLCEPT) 500 MG tablet Take 2 tablets (1,000 mg total) by mouth 2 (two) times daily. 360 tablet 4  . Omega-3 Fatty Acids (OMEGA-3 PO) Take 1,040 mg by mouth daily.    Marland Kitchen pyridostigmine (MESTINON) 60 MG tablet Take 1 tablet (60 mg total) by mouth 3 (three) times daily as needed. 90 tablet 6  . ramipril (ALTACE) 5 MG capsule Take 5 mg by mouth daily.    Marland Kitchen Resveratrol 100 MG CAPS Take 100 mg by mouth 2 (two) times daily.     . tamsulosin (FLOMAX) 0.4 MG CAPS capsule Take 0.4 mg by mouth daily.    Marland Kitchen triamterene-hydrochlorothiazide (MAXZIDE-25) 37.5-25 MG tablet Take 2 tablets by mouth daily. 60 tablet 3  . TURMERIC PO Take 100 mg by mouth 2 (two) times daily.     No current facility-administered medications for this visit.     Allergies:   Demerol [meperidine]; Aminoglycosides; Beta adrenergic blockers; Calcium channel blockers; Ciprofloxacin; Iodinated diagnostic agents; Penicillamine; Procainamide hcl; Quinine derivatives; Succinylcholine chloride; and Vecuronium bromide [vecuronium]  Social History:  The patient  reports that he quit smoking about 50 years ago. His smoking use included pipe. he has never used smokeless tobacco. He reports that he drinks about 0.6 oz of alcohol per week. He reports that he does not use drugs.   Family History:  The patient's family history includes Cancer in his brother and mother; Healthy in his daughter; Heart disease in his father.    ROS:  Please see the history of present illness.   Otherwise, review of systems is positive for DOE, kidney stones.   All other systems are reviewed and  negative.   PHYSICAL EXAM: VS:  BP 90/68   Pulse 60   Ht 5\' 7"  (1.702 m)   Wt 211 lb 9.6 oz (96 kg)   SpO2 96%   BMI 33.14 kg/m  , BMI Body mass index is 33.14 kg/m. GEN: Well nourished, well developed, in no acute distress  HEENT: normal  Neck: no JVD, carotid bruits, or masses Cardiac: iRRR; no murmurs, rubs, or gallops,no edema  Respiratory:  clear to auscultation bilaterally, normal work of breathing GI: soft, nontender, nondistended, + BS MS: no deformity or atrophy  Skin: warm and dry Neuro:  Strength and sensation are intact Psych: euthymic mood, full affect  EKG:  EKG is ordered today. Personal review of the ekg ordered shows atrial fibrillatin, iRBBB, rate 81   Recent Labs: 03/26/2016: TSH 1.170 09/20/2016: ALT 24; B Natriuretic Peptide 319.2 10/31/2016: BUN 18; Creatinine, Ser 1.00; Hemoglobin 13.9; Platelets 172; Potassium 3.5; Sodium 141    Lipid Panel     Component Value Date/Time   CHOL 144 09/13/2015 1041   CHOL 218 (H) 12/14/2014 0915   TRIG 60 09/13/2015 1041   TRIG 77 12/14/2014 0915   HDL 56 09/13/2015 1041   HDL 52 12/14/2014 0915   CHOLHDL 2.6 09/13/2015 1041   VLDL 12 09/13/2015 1041   LDLCALC 76 09/13/2015 1041   LDLCALC 151 (H) 12/14/2014 0915     Wt Readings from Last 3 Encounters:  12/19/16 211 lb 9.6 oz (96 kg)  11/28/16 215 lb 8 oz (97.8 kg)  11/12/16 224 lb (101.6 kg)      Other studies Reviewed: Additional studies/ records that were reviewed today include: TEE 09/21/16  Review of the above records today demonstrates:  - Left ventricle: The cavity size was normal. Wall thickness was   normal. Systolic function was at the lower limits of normal. The   estimated ejection fraction was in the range of 50% to 55%. Wall   motion was normal; there were no regional wall motion   abnormalities. No evidence of thrombus. - Aortic valve: No evidence of vegetation. No evidence of   vegetation. There was trivial regurgitation. - Mitral valve:  No evidence of vegetation. There was mild to   moderate regurgitation directed centrally. - Left atrium: The atrium was severely dilated. No evidence of   thrombus in the atrial cavity or appendage. No spontaneous echo   contrast was observed. - Right atrium: The atrium was dilated. - Atrial septum: No defect or patent foramen ovale was identified. - Tricuspid valve: No evidence of vegetation. No evidence of   vegetation. - Pulmonic valve: No evidence of vegetation.   ASSESSMENT AND PLAN:  1.  Persistent atrial fibrillation: On Eliquis for anticoagulation.  Has failed multiple cardioversion attempts.  Was put on digoxin in the last visit which has helped his heart rate.  I do not feel like we need to  plan for cardioversion again as his left atrium is severely dilated.  This would also greatly decrease the success rates of ablation.  Due to his lack of symptoms, we Sachit Gilman plan to continue with current management.  This patients CHA2DS2-VASc Score and unadjusted Ischemic Stroke Rate (% per year) is equal to 3.2 % stroke rate/year from a score of 3  Above score calculated as 1 point each if present [CHF, HTN, DM, Vascular=MI/PAD/Aortic Plaque, Age if 65-74, or Male] Above score calculated as 2 points each if present [Age > 75, or Stroke/TIA/TE]   2. Hypertension: Controlled no changes  3. Hyperlipidemia: To new current management    Current medicines are reviewed at length with the patient today.   The patient does not have concerns regarding his medicines.  The following changes were made today: None  Labs/ tests ordered today include:  Orders Placed This Encounter  Procedures  . EKG 12-Lead     Disposition:   FU with Maricus Tanzi 6 months  Signed, Gerrie Castiglia Jorja LoaMartin Nemiah Bubar, MD  12/19/2016 10:47 AM     [facility] [address] [place] [address] [phone] (office) [phone] (fax)

## 2016-12-19 NOTE — Patient Instructions (Signed)
Medication Instructions:  Your physician recommends that you continue on your current medications as directed. Please refer to the Current Medication list given to you today.  -- If you need a refill on your cardiac medications before your next appointment, please call your pharmacy. --  Labwork: None ordered  Testing/Procedures: None ordered  Follow-Up: Your physician wants you to follow-up in: 6 months with Dr. Camnitz.   Thank you for choosing [facility]!!   Jeni Duling, RN [phone]  Any Other Special Instructions Will Be Listed Below (If Applicable).         

## 2016-12-27 ENCOUNTER — Telehealth: Payer: Self-pay | Admitting: Cardiology

## 2016-12-27 NOTE — Telephone Encounter (Signed)
Patient with diagnosis of AFib on Eliquis for anticoagulation.    Procedure: Cystoscopy Date of procedure: TBD  CHADS2-VASc score of  3 (HTN, AGE,CAD); no history of TIA/stroke/VTE noted. Last cardioversion > 30 days ago.  CrCl = 6362ml/min  Per office protocol, patient can hold Eliquis for 2 days prior to procedure.  Patient will not need bridging with Lovenox (enoxaparin) around procedure.  Patient should restart Eliquis on the evening of procedure or day after, at discretion of procedure MD.  [name] PharmD, BCPS, CPP [facility] [address] [place] [id number] 12/27/2016 3:28 PM

## 2016-12-27 NOTE — Telephone Encounter (Signed)
   Chart reviewed as part of pre-operative protocol coverage. Given past medical history and time since last visit, based on ACC/AHA guidelines, Debroah Loopicholas A Hyneman would be at acceptable risk for the planned procedure without further cardiovascular testing.   I will route this to pharmacy for review of his anticoagulation. May need input from Dr. Elberta Fortisamnitz regarding if he would like for patient to remain on anticoagulation given treating urologist indicates continued anticoagulation would be ok.   I will route this recommendation to the requesting party via Epic fax function and remove from pre-op pool.  Please call with questions.  Eula Listenyan Ellene Bloodsaw, PA-C 12/27/2016, 3:00 PM

## 2016-12-27 NOTE — Telephone Encounter (Signed)
   Chart reviewed as part of pre-operative protocol coverage. Given past medical history and time since last visit, based on ACC/AHA guidelines, [name] would be at acceptable risk for the planned procedure without further cardiovascular testing. His chart has been reviewed by our pharmacist who has advised for the patient to hold Eliquis for 2 days prior to procedure and he should restart Eliquis on the evening of procedure or day after, at the discretion of procedure MD. He will not require bridging with Lovenox.   I will route this recommendation to the requesting party via Epic fax function and remove from pre-op pool.  Please call with questions.  Eula Listenyan Dyllin Gulley, PA-C 12/27/2016, 3:56 PM

## 2016-12-27 NOTE — Telephone Encounter (Signed)
° °  [facility] Pre-operative Risk Assessment    Request for surgical clearance:  1. What type of surgery is being performed? Cystoscopy Ureteroscopy with Laser ment Lithotripsy and stent replacement   2. When is this surgery scheduled?Pending   3. Are there any medications that need to be held prior to surgery and how long?General Cardiac Clearance-Pt may stay on blood thinner if doctor thinks it okay   4. Practice name and name of physician performing surgery? Dr Dell Ponto   5. What is your office phone and fax number? [phone] and fax is [phone]   6. Anesthesia type (None, local, MAC, general) ?General   Glyn Ade 12/27/2016, 2:39 PM  _________________________________________________________________   (provider comments below)

## 2016-12-28 NOTE — Telephone Encounter (Signed)
Clearance note routed via [employer] fax.

## 2017-01-04 ENCOUNTER — Other Ambulatory Visit: Payer: Self-pay | Admitting: Cardiology

## 2017-01-22 ENCOUNTER — Other Ambulatory Visit: Payer: Self-pay | Admitting: Cardiology

## 2017-01-22 ENCOUNTER — Other Ambulatory Visit: Payer: Self-pay | Admitting: Nurse Practitioner

## 2017-01-22 MED ORDER — APIXABAN 5 MG PO TABS: 5.0000 mg | ORAL_TABLET | Freq: Two times a day (BID) | ORAL | 3 refills | Status: DC

## 2017-01-22 NOTE — Progress Notes (Signed)
Calling for RX refill for his Eliquis.   His pharmacy is not accepting e-prescriptions.   I have called [facility] [phone]- [phone] and left a message to refill Eliquis 5 mg one tablet BID # 60 with 3 refills.   Rosalio MacadamiaLori C. Isak Sotomayor, RN, ANP-C [facility] [address] [place]  [address] [phone]

## 2017-01-31 ENCOUNTER — Encounter (INDEPENDENT_AMBULATORY_CARE_PROVIDER_SITE_OTHER): Payer: Medicare Other | Admitting: Ophthalmology

## 2017-01-31 DIAGNOSIS — H43813 Vitreous degeneration, bilateral: Secondary | ICD-10-CM

## 2017-01-31 DIAGNOSIS — H353221 Exudative age-related macular degeneration, left eye, with active choroidal neovascularization: Secondary | ICD-10-CM | POA: Diagnosis not present

## 2017-01-31 DIAGNOSIS — H353111 Nonexudative age-related macular degeneration, right eye, early dry stage: Secondary | ICD-10-CM

## 2017-01-31 DIAGNOSIS — I10 Essential (primary) hypertension: Secondary | ICD-10-CM | POA: Diagnosis not present

## 2017-01-31 DIAGNOSIS — H35033 Hypertensive retinopathy, bilateral: Secondary | ICD-10-CM | POA: Diagnosis not present

## 2017-01-31 DIAGNOSIS — H2513 Age-related nuclear cataract, bilateral: Secondary | ICD-10-CM | POA: Diagnosis not present

## 2017-02-25 ENCOUNTER — Telehealth: Payer: Self-pay

## 2017-02-25 NOTE — Telephone Encounter (Addendum)
**Note De-Identified Aleda Madl Obfuscation** The pt dropped off a letter in the front office that he received from [facility] stating that his Digoxin PA may expire on 02/11/2017.  I called [facility], his pharmacy, and was advised that the pts Digoxin RX went through without needing a PA and that his cost will be $9.60 for a 30 day supply.  I called the pt and made him aware. He stated that he will check with his insurance and call me back if he finds that he does need a PA for his Digoxin.  I have placed his letter from [employer] in the filing cabinet in the PA dept. In case we need it.

## 2017-03-13 NOTE — Progress Notes (Signed)
Cardiology Office Note    Date:  03/14/2017   ID:  David Loopicholas A Magner, DOB Dec 18, 1945, MRN [id number]  PCP:  Merri BrunetteSmith, Candace, MD  Cardiologist: Lesleigh NoeHenry W Smith III, MD   Chief Complaint  Patient presents with  . Coronary Artery Disease    History of Present Illness:  [name] is a 72 y.o. male history of myasthenia gravis, recently admitted with atrial fibrillation and underwent electrical cardioversion to sinus rhythm within the last 72 hours. Called this morning because heart rate was elevated again and he is concerned there is recurrent atrial fibrillation. Recurrent AF being treated with amiodarone loading and eventual repeated electrical cardioversion.  He has been seen by EP, Camnitz, and is being managed for rate control given severely dilated left atrium.  He is concerned about where he stands with atrial fibrillation management.  He did not get ablation.  He was not put on another antiarrhythmic agent after discontinuation of amiodarone.  Digoxin was started and apparently there is good rate control.  Quality of life is adequate on his current therapy.  He denies chest discomfort.  There is no peripheral edema.  Overall he feels well.   Past Medical History:  Diagnosis Date  . GERD (gastroesophageal reflux disease)   . History of Bell's palsy   . History of pericarditis   . Hypertension   . Mixed hyperlipidemia   . Myasthenia gravis (HCC)    currently in remission (11/2014)     Past Surgical History:  Procedure Laterality Date  . CARDIOVERSION N/A 09/21/2016   Procedure: CARDIOVERSION;  Surgeon: Thurmon Fairroitoru, Mihai, MD;  Location: [facility] END[facility];  Service: Cardiovascular;  Laterality: N/A;  . CARDIOVERSION N/A 10/17/2016   Procedure: CARDIOVERSION;  Surgeon: Laurey MoraleMcLean, Dalton S, MD;  [facility];  Service: Cardiovascular;  Laterality: N/A;  . CARDIOVERSION N/A 10/31/2016   Procedure: CARDIOVERSION;  Surgeon: Wendall StadeNishan, Peter C, MD;  [facility];   Service: Cardiovascular;  Laterality: N/A;  . LAMINECTOMY  1991  . TEE WITHOUT CARDIOVERSION N/A 09/21/2016   Procedure: TRANSESOPHAGEAL ECHOCARDIOGRAM (TEE);  Surgeon: Thurmon Fairroitoru, Mihai, MD;  [facility];  Service: Cardiovascular;  Laterality: N/A;  . TEE WITHOUT CARDIOVERSION N/A 10/31/2016   Procedure: TRANSESOPHAGEAL ECHOCARDIOGRAM (TEE);  Surgeon: Wendall StadeNishan, Peter C, MD;  [facility];  Service: Cardiovascular;  Laterality: N/A;  . TONSILLECTOMY  1950    Current Medications: Outpatient Medications Prior to Visit  Medication Sig Dispense Refill  . Alpha-Lipoic Acid 300 MG CAPS Take 300 mg by mouth daily.     Marland Kitchen. apixaban (ELIQUIS) 5 MG TABS tablet Take 1 tablet (5 mg total) by mouth 2 (two) times daily. 60 tablet 3  . atorvastatin (LIPITOR) 20 MG tablet TAKE 1 TABLET BY MOUTH DAILY 30 tablet 11  . Coenzyme Q10 (COQ-10) 100 MG CAPS Take 100 mg by mouth daily.     Marland Kitchen. DIGOX 250 MCG tablet TAKE 1 TABLET(0.25 MG) BY MOUTH DAILY 30 tablet 8  . mirabegron ER (MYRBETRIQ) 50 MG TB24 tablet Take 50 mg by mouth daily.    . Multiple Vitamins-Minerals (CENTRUM SILVER PO) Take 1 tablet by mouth daily.    . mycophenolate (CELLCEPT) 500 MG tablet Take 2 tablets (1,000 mg total) by mouth 2 (two) times daily. 360 tablet 4  . Omega-3 Fatty Acids (OMEGA-3 PO) Take 1,040 mg by mouth daily.    Marland Kitchen. pyridostigmine (MESTINON) 60 MG tablet Take 1 tablet (60 mg total) by mouth 3 (three) times daily as needed. 90 tablet 6  .  ramipril (ALTACE) 5 MG capsule Take 5 mg by mouth daily.    Marland Kitchen Resveratrol 100 MG CAPS Take 100 mg by mouth 2 (two) times daily.     . tamsulosin (FLOMAX) 0.4 MG CAPS capsule Take 0.4 mg by mouth daily.    Marland Kitchen triamterene-hydrochlorothiazide (MAXZIDE-25) 37.5-25 MG tablet Take 2 tablets by mouth daily. 60 tablet 3  . TURMERIC PO Take 100 mg by mouth 2 (two) times daily.     No facility-administered medications prior to visit.      Allergies:   Demerol [meperidine]; Aminoglycosides;  Beta adrenergic blockers; Calcium channel blockers; Ciprofloxacin; Iodinated diagnostic agents; Penicillamine; Procainamide hcl; Quinine derivatives; Succinylcholine chloride; and Vecuronium bromide [vecuronium]   Social History   Socioeconomic History  . Marital status: Married    Spouse name: None  . Number of children: 1  . Years of education: Grad Sch  . Highest education level: None  Social Needs  . Financial resource strain: None  . Food insecurity - worry: None  . Food insecurity - inability: None  . Transportation needs - medical: None  . Transportation needs - non-medical: None  Occupational History  . Occupation: Pharmacologist  Tobacco Use  . Smoking status: Former Smoker    Types: Pipe    Last attempt to quit: 12/10/1966    Years since quitting: 50.2  . Smokeless tobacco: Never Used  Substance and Sexual Activity  . Alcohol use: Yes    Alcohol/week: 0.6 oz    Types: 1 Glasses of wine per week  . Drug use: No  . Sexual activity: None  Other Topics Concern  . None  Social History Narrative   Lives at home with his wife.   Right-handed.   Occasional caffeine use.     Family History:  The patient's family history includes Cancer in his brother and mother; Healthy in his daughter; Heart disease in his father.   ROS:   Please see the history of present illness.    Has myasthenia gravis.  No recent difficulty. All other systems reviewed and are negative.   PHYSICAL EXAM:   VS:  BP 102/66   Pulse 81   Ht 5\' 7"  (1.702 m)   Wt 217 lb 6.4 oz (98.6 kg)   BMI 34.05 kg/m    GEN: Well nourished, well developed, in no acute distress  HEENT: normal  Neck: no JVD, carotid bruits, or masses Cardiac: IIRR; no murmurs, rubs, or gallops,no edema  Respiratory:  clear to auscultation bilaterally, normal work of breathing GI: soft, nontender, nondistended, + BS MS: no deformity or atrophy  Skin: warm and dry, no rash Neuro:  Alert and Oriented x 3, Strength and  sensation are intact Psych: euthymic mood, full affect  Wt Readings from Last 3 Encounters:  03/14/17 217 lb 6.4 oz (98.6 kg)  12/19/16 211 lb 9.6 oz (96 kg)  11/28/16 215 lb 8 oz (97.8 kg)      Studies/Labs Reviewed:   EKG:  EKG not repeated..  Last tracing performed December 19, 2016 during a visit with Dr. Elberta Fortis.  Atrial fibrillation, rate less than 80 bpm, diffuse ST abnormality secondary to dig effect.  Recent Labs: 03/26/2016: TSH 1.170 09/20/2016: ALT 24; B Natriuretic Peptide 319.2 10/31/2016: BUN 18; Creatinine, Ser 1.00; Hemoglobin 13.9; Platelets 172; Potassium 3.5; Sodium 141   Lipid Panel    Component Value Date/Time   CHOL 144 09/13/2015 1041   CHOL 218 (H) 12/14/2014 0915   TRIG 60 09/13/2015 1041  TRIG 77 12/14/2014 0915   HDL 56 09/13/2015 1041   HDL 52 12/14/2014 0915   CHOLHDL 2.6 09/13/2015 1041   VLDL 12 09/13/2015 1041   LDLCALC 76 09/13/2015 1041   LDLCALC 151 (H) 12/14/2014 0915    Additional studies/ records that were reviewed today include:  Transesophageal echocardiogram 09/21/16: Study Conclusions   - Left ventricle: The cavity size was normal. Wall thickness was   normal. Systolic function was at the lower limits of normal. The   estimated ejection fraction was in the range of 50% to 55%. Wall   motion was normal; there were no regional wall motion   abnormalities. No evidence of thrombus. - Aortic valve: No evidence of vegetation. No evidence of   vegetation. There was trivial regurgitation. - Mitral valve: No evidence of vegetation. There was mild to   moderate regurgitation directed centrally. - Left atrium: The atrium was severely dilated. No evidence of   thrombus in the atrial cavity or appendage. No spontaneous echo   contrast was observed. - Right atrium: The atrium was dilated. - Atrial septum: No defect or patent foramen ovale was identified. - Tricuspid valve: No evidence of vegetation. No evidence of   vegetation. - Pulmonic  valve: No evidence of vegetation.   Impressions:   - Successful cardioversion.  Myocardial perfusion imaging 12/23/14: Study Highlights   Addendum by Thurmon Fair, MD on Fri Dec 24, 2014  5:09 PM   The left ventricular ejection fraction is normal (55-65%).  Nuclear stress EF: 57%.  There was no ST segment deviation noted during stress.  The study is normal.  This is a low risk study.   Low risk stress nuclear study with normal perfusion and normal left ventricular regional and global systolic function.   Chest CT 12/10/2005: IMPRESSION:  1. There is no CT evidence of thymoma. No anterior mediastinal mass is seen.   2. Stable low attenuation mediastinal nodes when compared to a CT of 2/04.   3. Coronary artery calcifications.     ASSESSMENT:    1. Chronic atrial fibrillation (HCC)   2. Mixed hyperlipidemia   3. Essential hypertension   4. High coronary artery calcium score   5. Ascending aortic aneurysm (HCC)   6. Long-term use of high-risk medication      PLAN:  In order of problems listed above:  1. We will converted the patient atrial fibrillation diagnosis to chronic.  He has good rate control on digoxin.  We will recheck a digoxin level today.  Beta-blocker and calcium channel blocker therapy are not is easy to use because of his relatively low blood pressure.  We could make room for these agents and discontinue digoxin by eliminating ramipril.  Could also decrease intensity of diuretic therapy.  I think it is helpful to be on ACE inhibitor therapy. 2. LDL target should be less than 100 since the patient has coronary calcification on CT. 3. Relatively low blood pressure as noted above.  Not symptomatic. 4. Risk factor modification including blood pressure, LDL cholesterol, aerobic activity. 5. Patient does not have an aneurysm.  This will be removed from the patient's problem list. 6. Anticoagulation therapy without bleeding.  Clinical follow-up in 6 months.   He will record heart rates that occur during rest on his iPhone app.  I would like for exertional related heart rates to be less than 140 bpm.  Overall, I strategy is now rate control.  He mentioned that Dr. Elberta Fortis was going to start  a different antiarrhythmic agent, I presume dofetilide, although states that there was some confusion about what the agent was the last time he saw him.  At this point I believe we can manage the patient's atrial fibrillation.  No real need for EP follow-up unless issues become complicating or he requires pacemaker therapy.  May eventually try to discontinue digoxin use beta-blocker or calcium channel blocker therapy but no changes for now.    Medication Adjustments/Labs and Tests Ordered: Current medicines are reviewed at length with the patient today.  Concerns regarding medicines are outlined above.  Medication changes, Labs and Tests ordered today are listed in the Patient Instructions below. Patient Instructions  Medication Instructions:  Your physician recommends that you continue on your current medications as directed. Please refer to the Current Medication list given to you today.  Labwork: Digoxin level  Testing/Procedures: None  Follow-Up: Your physician wants you to follow-up in: 6 months with Dr. Katrinka Blazing.  You will receive a reminder letter in the mail two months in advance. If you don't receive a letter, please call our office to schedule the follow-up appointment.   Any Other Special Instructions Will Be Listed Below (If Applicable).  Please record your heart rate while walking and bring those recordings to your next appointment.    If you need a refill on your cardiac medications before your next appointment, please call your pharmacy.      Signed, Lesleigh Noe, MD  03/14/2017 11:15 AM    [facility] [address] [place], [place], [place]  [address] Phone: [phone]; Fax: [phone]

## 2017-03-14 ENCOUNTER — Encounter: Payer: Self-pay | Admitting: Interventional Cardiology

## 2017-03-14 ENCOUNTER — Ambulatory Visit: Payer: Medicare Other | Admitting: Interventional Cardiology

## 2017-03-14 VITALS — BP 102/66 | HR 81 | Ht 67.0 in | Wt 217.4 lb

## 2017-03-14 DIAGNOSIS — Z79899 Other long term (current) drug therapy: Secondary | ICD-10-CM | POA: Diagnosis not present

## 2017-03-14 DIAGNOSIS — E782 Mixed hyperlipidemia: Secondary | ICD-10-CM

## 2017-03-14 DIAGNOSIS — R931 Abnormal findings on diagnostic imaging of heart and coronary circulation: Secondary | ICD-10-CM | POA: Diagnosis not present

## 2017-03-14 DIAGNOSIS — I482 Chronic atrial fibrillation, unspecified: Secondary | ICD-10-CM

## 2017-03-14 DIAGNOSIS — I7121 Aneurysm of the ascending aorta, without rupture: Secondary | ICD-10-CM

## 2017-03-14 DIAGNOSIS — I10 Essential (primary) hypertension: Secondary | ICD-10-CM

## 2017-03-14 DIAGNOSIS — I712 Thoracic aortic aneurysm, without rupture: Secondary | ICD-10-CM | POA: Diagnosis not present

## 2017-03-14 NOTE — Patient Instructions (Addendum)
Medication Instructions:  Your physician recommends that you continue on your current medications as directed. Please refer to the Current Medication list given to you today.  Labwork: Digoxin level  Testing/Procedures: None  Follow-Up: Your physician wants you to follow-up in: 6 months with Dr. Katrinka BlazingSmith.  You will receive a reminder letter in the mail two months in advance. If you don't receive a letter, please call our office to schedule the follow-up appointment.   Any Other Special Instructions Will Be Listed Below (If Applicable).  Please record your heart rate while walking and bring those recordings to your next appointment.    If you need a refill on your cardiac medications before your next appointment, please call your pharmacy.

## 2017-03-15 LAB — DIGOXIN LEVEL: Digoxin, Serum: 1 ng/mL — ABNORMAL HIGH (ref 0.5–0.9)

## 2017-03-19 ENCOUNTER — Telehealth: Payer: Self-pay | Admitting: Interventional Cardiology

## 2017-03-19 NOTE — Telephone Encounter (Signed)
New Message   Pt c/o medication issue:  1. Name of Medication: Eliquis   2. How are you currently taking this medication (dosage and times per day)? 5mg  two times a day   3. Are you having a reaction (difficulty breathing--STAT)? vomiting  4. What is your medication issue? Patient states that he is continuously vomiting and can not even keep water down. Please call to discuss.

## 2017-03-19 NOTE — Telephone Encounter (Signed)
Patient has been vomiting this morning and was not able to take his eliquis. Patient stated he thinks he might have eaten something last night that did not agree with him. Patient is concerned, because he is missing a dose of eliquis. Informed patient to try to take his medication if he can keep it down. Informed patient if he missed a dose and it's close to his next dose to wait and take his next dose at his regular scheduled time. Encouraged patient to try to take his medications as scheduled. Encouraged patient to first eat something to see if he can keep it down before he tries to take his eliquis. Patient verbalized understanding.

## 2017-03-27 ENCOUNTER — Telehealth: Payer: Self-pay

## 2017-03-27 NOTE — Telephone Encounter (Signed)
**Note De-Identified Blase Beckner Obfuscation** I have done a Digoxin PA through covermymeds as requested in letter received Mckenze Slone fax [facility] [employer].

## 2017-03-28 ENCOUNTER — Encounter (INDEPENDENT_AMBULATORY_CARE_PROVIDER_SITE_OTHER): Payer: Medicare Other | Admitting: Ophthalmology

## 2017-03-28 DIAGNOSIS — I10 Essential (primary) hypertension: Secondary | ICD-10-CM

## 2017-03-28 DIAGNOSIS — H43813 Vitreous degeneration, bilateral: Secondary | ICD-10-CM

## 2017-03-28 DIAGNOSIS — H353112 Nonexudative age-related macular degeneration, right eye, intermediate dry stage: Secondary | ICD-10-CM

## 2017-03-28 DIAGNOSIS — H35033 Hypertensive retinopathy, bilateral: Secondary | ICD-10-CM

## 2017-03-28 DIAGNOSIS — H353221 Exudative age-related macular degeneration, left eye, with active choroidal neovascularization: Secondary | ICD-10-CM | POA: Diagnosis not present

## 2017-03-28 DIAGNOSIS — H2513 Age-related nuclear cataract, bilateral: Secondary | ICD-10-CM

## 2017-05-13 ENCOUNTER — Other Ambulatory Visit: Payer: Self-pay | Admitting: Cardiology

## 2017-05-13 NOTE — Telephone Encounter (Signed)
Eliquis 5mg  refill request received; pt is 72 yrs old, wt-98.6kg, Crea-1.00 on 10/31/16, last seen by Dr. Katrinka BlazingSmith on 03/14/17; will send in refill to requested pharmacy.

## 2017-05-23 ENCOUNTER — Encounter (INDEPENDENT_AMBULATORY_CARE_PROVIDER_SITE_OTHER): Payer: Medicare Other | Admitting: Ophthalmology

## 2017-05-23 DIAGNOSIS — H353112 Nonexudative age-related macular degeneration, right eye, intermediate dry stage: Secondary | ICD-10-CM | POA: Diagnosis not present

## 2017-05-23 DIAGNOSIS — H353221 Exudative age-related macular degeneration, left eye, with active choroidal neovascularization: Secondary | ICD-10-CM

## 2017-05-23 DIAGNOSIS — I10 Essential (primary) hypertension: Secondary | ICD-10-CM | POA: Diagnosis not present

## 2017-05-23 DIAGNOSIS — H2513 Age-related nuclear cataract, bilateral: Secondary | ICD-10-CM

## 2017-05-23 DIAGNOSIS — H35033 Hypertensive retinopathy, bilateral: Secondary | ICD-10-CM | POA: Diagnosis not present

## 2017-05-23 DIAGNOSIS — H43813 Vitreous degeneration, bilateral: Secondary | ICD-10-CM

## 2017-05-29 ENCOUNTER — Ambulatory Visit: Payer: Medicare Other | Admitting: Neurology

## 2017-05-29 ENCOUNTER — Encounter: Payer: Self-pay | Admitting: Neurology

## 2017-05-29 VITALS — BP 117/80 | HR 85 | Ht 67.0 in | Wt 216.0 lb

## 2017-05-29 DIAGNOSIS — G7000 Myasthenia gravis without (acute) exacerbation: Secondary | ICD-10-CM | POA: Diagnosis not present

## 2017-05-29 MED ORDER — MYCOPHENOLATE MOFETIL 500 MG PO TABS: 500.0000 mg | ORAL_TABLET | Freq: Two times a day (BID) | ORAL | 4 refills | Status: DC

## 2017-05-29 NOTE — Progress Notes (Signed)
PATIENT: [name] DOB: May 21, 1945  Chief Complaint  Patient presents with  . Myasthenia Gravis    Feels he is doing well on Mestinon and Cellcept.  . Atrial Fibrillation    He is being closely monitored by cardiology. He is using oral medications to treat condition.  There are no plans, at the moment, for pacemaker.     HISTORICAL  [name] 72 years old right-handed, accompanied by his wife, seen in refer by primary care physician Dr.  [name] for evaluation of weakness, initial evaluation was March 26 2016.  He was a patient of our clinicIn the past, most recent visit was in October 2011, he had history of acetylcholine receptor antibody positive myasthenia gravis, hypertension,   Myasthenia gravis, he presented with excessive fatigue, intermittent ptosis in June 2006, at its worst, 2 months after symptom onset, he developed mild breathing difficulty, head dropped, upper and lower extremity weakness, the diagnosis is confirmed by positive acetylcholine receptor antibody, single fiber EMG of right extensor digitorum brevis, frontalis, orbicularis oris oculi by Dr. Dimas Aguas at [facility], CT of the chest fail to demonstrate abnormality.  He responded very well to CellCept 1000 mg twice a day, quick improvement within 2 months after he received CellCept treatment, never was treated with prednisone, for a while, he required as needed Mestinon, he was eventually able to taper off CellCept at the end of 2011 with no recurrent symptoms.,   Around December 2017, he noticed he gets fatigue easily, on February 08 2016, while he walking dogs, he notice shortness of breath after 1 mile, chest heaviness, symptoms has been persistent since then, he denies difficulty breathing in a resting position, no chewing swallowing difficulty no double vision, wife noticed he did not fatigued pole left ptosis, he also noticed weakness in his arms, feel like he has worked out  hard,  He has prostate hypertrophy since 2015, was treated with alpha 2 agonist Flomax, and acetylcholine receptor agonist tolterodine  UPDATE May 23 2016: He is now taking Cellcept 500mg  2 tab twice a day, he has no double vision, mestinon 60mg  1/2 tab bid prn,   He walks his dog 5 miles a day without much difficulty  I reviewed the laboratory evaluation in February 2018: Elevated acetylcholine binding antibody 22.8, blocking antibody 34, normal TSH 1.17, CMP creatinine of 0.71, CBC, hemoglobin 14.7,  UPDATE Nov 28 2016: He was diagnosed with atrial fibrillation since summer of 2018, he had transesophageal echocardiogram cardial conversion three time, but he is still in atrial fibrillation.   He is a potential candidate for pacemaker or radiofrequency treatment, but needs to have general anesthesia, intubation.   He is now taking cellcept 500mg  2 tab bid, he does not need mestinon. He denies ocular or bulbar symptoms.  UPDATE May 29 2017: He has failed cardiac conversion in Sept 2018, heart rate is  under reasonable control with digoxin and he is also taking elquis, no signs of myasthenia gravis, in specific, no double vision, droopy eyelid, limb muscle weakness, walk with out difficulty  REVIEW OF SYSTEMS: Full 14 system review of systems performed and notable only for  atrial fibrillation, urgency ALLERGIES: Allergies  Allergen Reactions  . Demerol [Meperidine]     hallucinations  . Aminoglycosides     Can not use due to mysthenia gravis  . Beta Adrenergic Blockers     Can not use due to mysthenia gravis  . Calcium Channel Blockers  Can not use due to mysthenia gravis  . Ciprofloxacin     Can not use due to mysthenia gravis  . Iodinated Diagnostic Agents     Can not use due to mysthenia gravis  . Penicillamine     Can not use due to mysthenia gravis  . Procainamide Hcl     Can not use due to mysthenia gravis  . Quinine Derivatives     Can not use due to mysthenia  gravis  . Succinylcholine Chloride     Can not use due to mysthenia gravis  . Vecuronium Bromide [Vecuronium]     Can not use due to mysthenia gravis    HOME MEDICATIONS: Current Outpatient Medications  Medication Sig Dispense Refill  . Alpha-Lipoic Acid 300 MG CAPS Take 300 mg by mouth daily.     Marland Kitchen atorvastatin (LIPITOR) 20 MG tablet TAKE 1 TABLET BY MOUTH DAILY 30 tablet 11  . Coenzyme Q10 (COQ-10) 100 MG CAPS Take 100 mg by mouth daily.     Marland Kitchen DIGOX 250 MCG tablet TAKE 1 TABLET(0.25 MG) BY MOUTH DAILY 30 tablet 8  . ELIQUIS 5 MG TABS tablet TAKE 1 TABLET BY MOUTH TWICE DAILY 60 tablet 8  . Multiple Vitamins-Minerals (CENTRUM SILVER PO) Take 1 tablet by mouth daily.    . mycophenolate (CELLCEPT) 500 MG tablet Take 2 tablets (1,000 mg total) by mouth 2 (two) times daily. 360 tablet 4  . Omega-3 Fatty Acids (OMEGA-3 PO) Take 1,040 mg by mouth daily.    Marland Kitchen pyridostigmine (MESTINON) 60 MG tablet Take 1 tablet (60 mg total) by mouth 3 (three) times daily as needed. 90 tablet 6  . ramipril (ALTACE) 5 MG capsule Take 5 mg by mouth daily.    Marland Kitchen Resveratrol 100 MG CAPS Take 100 mg by mouth 2 (two) times daily.     . tamsulosin (FLOMAX) 0.4 MG CAPS capsule Take 0.4 mg by mouth daily.    Marland Kitchen triamterene-hydrochlorothiazide (MAXZIDE-25) 37.5-25 MG tablet Take 2 tablets by mouth daily. 60 tablet 3  . TURMERIC PO Take 100 mg by mouth 2 (two) times daily.     No current facility-administered medications for this visit.     PAST MEDICAL HISTORY: Past Medical History:  Diagnosis Date  . GERD (gastroesophageal reflux disease)   . History of Bell's palsy   . History of pericarditis   . Hypertension   . Mixed hyperlipidemia   . Myasthenia gravis (HCC)    currently in remission (11/2014)     PAST SURGICAL HISTORY: Past Surgical History:  Procedure Laterality Date  . CARDIOVERSION N/A 09/21/2016   Procedure: CARDIOVERSION;  Surgeon: Thurmon Fair, MD;  Location: [facility] ENDOSCOPY;  Service:  Cardiovascular;  Laterality: N/A;  . CARDIOVERSION N/A 10/17/2016   Procedure: CARDIOVERSION;  Surgeon: Laurey Morale, MD;  Location: [facility] ENDOSCOPY;  Service: Cardiovascular;  Laterality: N/A;  . CARDIOVERSION N/A 10/31/2016   Procedure: CARDIOVERSION;  Surgeon: Wendall Stade, MD;  Location: [facility] ENDOSCOPY;  Service: Cardiovascular;  Laterality: N/A;  . LAMINECTOMY  1991  . TEE WITHOUT CARDIOVERSION N/A 09/21/2016   Procedure: TRANSESOPHAGEAL ECHOCARDIOGRAM (TEE);  Surgeon: Thurmon Fair, MD;  Location: [facility] ENDOSCOPY;  Service: Cardiovascular;  Laterality: N/A;  . TEE WITHOUT CARDIOVERSION N/A 10/31/2016   Procedure: TRANSESOPHAGEAL ECHOCARDIOGRAM (TEE);  Surgeon: Wendall Stade, MD;  Location: [facility] ENDOSCOPY;  Service: Cardiovascular;  Laterality: N/A;  . TONSILLECTOMY  1950    FAMILY HISTORY: Family History  Problem Relation Age of Onset  . Cancer Mother   .  Heart disease Father   . Cancer Brother        bladder/ in remission  . Healthy Daughter        age 74    SOCIAL HISTORY:  Social History   Socioeconomic History  . Marital status: Married    Spouse name: Not on file  . Number of children: 1  . Years of education: Grad Sch  . Highest education level: Not on file  Occupational History  . Occupation: [employer]  Social Needs  . Financial resource strain: Not on file  . Food insecurity:    Worry: Not on file    Inability: Not on file  . Transportation needs:    Medical: Not on file    Non-medical: Not on file  Tobacco Use  . Smoking status: Former Smoker    Types: Pipe    Last attempt to quit: 12/10/1966    Years since quitting: 50.5  . Smokeless tobacco: Never Used  Substance and Sexual Activity  . Alcohol use: Yes    Alcohol/week: 0.6 oz    Types: 1 Glasses of wine per week  . Drug use: No  . Sexual activity: Not on file  Lifestyle  . Physical activity:    Days per week: Not on file    Minutes per session: Not on file  . Stress: Not on file   Relationships  . Social connections:    Talks on phone: Not on file    Gets together: Not on file    Attends religious service: Not on file    Active member of club or organization: Not on file    Attends meetings of clubs or organizations: Not on file    Relationship status: Not on file  . Intimate partner violence:    Fear of current or ex partner: Not on file    Emotionally abused: Not on file    Physically abused: Not on file    Forced sexual activity: Not on file  Other Topics Concern  . Not on file  Social History Narrative   Lives at home with his wife.   Right-handed.   Occasional caffeine use.     PHYSICAL EXAM   Vitals:   05/29/17 0934  BP: 117/80  Pulse: 85  Weight: 216 lb (98 kg)  Height: 5\' 7"  (1.702 m)    Not recorded      Body mass index is 33.83 kg/m.  PHYSICAL EXAMNIATION:  Gen: NAD, conversant, well nourised, obese, well groomed                     Cardiovascular: Regular rate rhythm, no peripheral edema, warm, nontender. Eyes: Conjunctivae clear without exudates or hemorrhage Neck: Supple, no carotid bruits. Pulmonary: Clear to auscultation bilaterally   NEUROLOGICAL EXAM:  MENTAL STATUS: Speech:    Speech is normal; fluent and spontaneous with normal comprehension.  Cognition:     Orientation to time, place and person     Normal recent and remote memory     Normal Attention span and concentration     Normal Language, naming, repeating,spontaneous speech     Fund of knowledge   CRANIAL NERVES: CN II: Visual fields are full to confrontation. Fundoscopic exam is normal with sharp discs and no vascular changes. Pupils are round equal and briskly reactive to light. CN III, IV, VI: extraocular movement are normal. No ptosis.  Cover and uncover test showed bilateral exophoria. CN V: Facial sensation is intact to pinprick  in all 3 divisions bilaterally. Corneal responses are intact.  CN VII: Face is symmetric with normal eye closure and  smile. CN VIII: Hearing is normal to rubbing fingers CN IX, X: Palate elevates symmetrically. Phonation is normal. CN XI: Head turning and shoulder shrug are intact CN XII: Tongue is midline with normal movements and no atrophy.  MOTOR: I was not able to detect any neck flexion, proximal upper and lower extremity muscle weakness.  REFLEXES: Reflexes are 2+ and symmetric at the biceps, triceps, knees, and ankles. Plantar responses are flexor.  SENSORY: Intact to light touch, pinprick, positional sensation and vibratory sensation are intact in fingers and toes.  COORDINATION: Rapid alternating movements and fine finger movements are intact. There is no dysmetria on finger-to-nose and heel-knee-shin.    GAIT/STANCE: Posture is normal. Gait is steady with normal steps, base, arm swing, and turning. Heel and toe walking are normal. Tandem gait is normal.  Romberg is absent.   DIAGNOSTIC DATA (LABS, IMAGING, TESTING) - I reviewed patient records, labs, notes, testing and imaging myself where available.   ASSESSMENT AND PLAN  [name] is a 72 y.o. male   Seropositive generalized myasthenia gravis , recurrent weakness since December 2017, Newly diagnosis of atrial fibrillation  Decrease cellcept to 500mg  bid.  There is no evidence of ocular, bulbar, limb muscle weakness.     [name], M.D. Ph.D.  [facility] [address], [address] [place], [place] [address] Ph: [phone] Fax: [phone]  CC: [name],

## 2017-05-30 LAB — COMPREHENSIVE METABOLIC PANEL
ALBUMIN: 4.4 g/dL (ref 3.5–4.8)
ALK PHOS: 80 IU/L (ref 39–117)
ALT: 21 IU/L (ref 0–44)
AST: 22 IU/L (ref 0–40)
Albumin/Globulin Ratio: 2.3 — ABNORMAL HIGH (ref 1.2–2.2)
BILIRUBIN TOTAL: 0.5 mg/dL (ref 0.0–1.2)
BUN / CREAT RATIO: 19 (ref 10–24)
BUN: 15 mg/dL (ref 8–27)
CHLORIDE: 102 mmol/L (ref 96–106)
CO2: 23 mmol/L (ref 20–29)
CREATININE: 0.79 mg/dL (ref 0.76–1.27)
Calcium: 9.5 mg/dL (ref 8.6–10.2)
GFR calc Af Amer: 103 mL/min/{1.73_m2} (ref >59)
GFR calc non Af Amer: 89 mL/min/{1.73_m2} (ref >59)
GLUCOSE: 91 mg/dL (ref 65–99)
Globulin, Total: 1.9 g/dL (ref 1.5–4.5)
Potassium: 3.9 mmol/L (ref 3.5–5.2)
Sodium: 140 mmol/L (ref 134–144)
TOTAL PROTEIN: 6.3 g/dL (ref 6.0–8.5)

## 2017-05-30 LAB — CBC WITH DIFFERENTIAL/PLATELET
BASOS ABS: 0 10*3/uL (ref 0.0–0.2)
Basos: 0 %
EOS (ABSOLUTE): 0.2 10*3/uL (ref 0.0–0.4)
Eos: 3 %
HEMOGLOBIN: 14.7 g/dL (ref 13.0–17.7)
Hematocrit: 44.1 % (ref 37.5–51.0)
IMMATURE GRANS (ABS): 0 10*3/uL (ref 0.0–0.1)
Immature Granulocytes: 1 %
LYMPHS: 11 %
Lymphocytes Absolute: 0.8 10*3/uL (ref 0.7–3.1)
MCH: 30.8 pg (ref 26.6–33.0)
MCHC: 33.3 g/dL (ref 31.5–35.7)
MCV: 92 fL (ref 79–97)
MONOCYTES: 7 %
Monocytes Absolute: 0.5 10*3/uL (ref 0.1–0.9)
Neutrophils Absolute: 5.7 10*3/uL (ref 1.4–7.0)
Neutrophils: 78 %
Platelets: 184 10*3/uL (ref 150–379)
RBC: 4.78 x10E6/uL (ref 4.14–5.80)
RDW: 14.3 % (ref 12.3–15.4)
WBC: 7.3 10*3/uL (ref 3.4–10.8)

## 2017-05-30 LAB — TSH: TSH: 1.15 u[IU]/mL (ref 0.450–4.500)

## 2017-07-18 ENCOUNTER — Encounter (INDEPENDENT_AMBULATORY_CARE_PROVIDER_SITE_OTHER): Payer: Medicare Other | Admitting: Ophthalmology

## 2017-07-18 DIAGNOSIS — I10 Essential (primary) hypertension: Secondary | ICD-10-CM | POA: Diagnosis not present

## 2017-07-18 DIAGNOSIS — H353221 Exudative age-related macular degeneration, left eye, with active choroidal neovascularization: Secondary | ICD-10-CM

## 2017-07-18 DIAGNOSIS — H353112 Nonexudative age-related macular degeneration, right eye, intermediate dry stage: Secondary | ICD-10-CM

## 2017-07-18 DIAGNOSIS — H35033 Hypertensive retinopathy, bilateral: Secondary | ICD-10-CM

## 2017-07-18 DIAGNOSIS — H43813 Vitreous degeneration, bilateral: Secondary | ICD-10-CM | POA: Diagnosis not present

## 2017-09-19 ENCOUNTER — Encounter (INDEPENDENT_AMBULATORY_CARE_PROVIDER_SITE_OTHER): Payer: Medicare Other | Admitting: Ophthalmology

## 2017-09-19 DIAGNOSIS — H353112 Nonexudative age-related macular degeneration, right eye, intermediate dry stage: Secondary | ICD-10-CM

## 2017-09-19 DIAGNOSIS — H43813 Vitreous degeneration, bilateral: Secondary | ICD-10-CM

## 2017-09-19 DIAGNOSIS — H353221 Exudative age-related macular degeneration, left eye, with active choroidal neovascularization: Secondary | ICD-10-CM | POA: Diagnosis not present

## 2017-09-19 DIAGNOSIS — I10 Essential (primary) hypertension: Secondary | ICD-10-CM

## 2017-09-19 DIAGNOSIS — H35033 Hypertensive retinopathy, bilateral: Secondary | ICD-10-CM

## 2017-09-23 ENCOUNTER — Other Ambulatory Visit: Payer: Self-pay | Admitting: Cardiology

## 2017-10-20 ENCOUNTER — Encounter (HOSPITAL_COMMUNITY): Payer: Self-pay | Admitting: Emergency Medicine

## 2017-10-20 ENCOUNTER — Other Ambulatory Visit: Payer: Self-pay

## 2017-10-20 ENCOUNTER — Emergency Department (HOSPITAL_COMMUNITY): Payer: Medicare Other

## 2017-10-20 ENCOUNTER — Emergency Department (HOSPITAL_COMMUNITY)
Admission: EM | Admit: 2017-10-20 | Discharge: 2017-10-20 | Disposition: A | Discharge status: Home / Self Care | Payer: Medicare Other | Attending: Emergency Medicine | Admitting: Emergency Medicine

## 2017-10-20 DIAGNOSIS — S161XXA Strain of muscle, fascia and tendon at neck level, initial encounter: Secondary | ICD-10-CM

## 2017-10-20 DIAGNOSIS — Y999 Unspecified external cause status: Secondary | ICD-10-CM | POA: Diagnosis not present

## 2017-10-20 DIAGNOSIS — Z7901 Long term (current) use of anticoagulants: Secondary | ICD-10-CM | POA: Diagnosis not present

## 2017-10-20 DIAGNOSIS — Y929 Unspecified place or not applicable: Secondary | ICD-10-CM | POA: Insufficient documentation

## 2017-10-20 DIAGNOSIS — S199XXA Unspecified injury of neck, initial encounter: Secondary | ICD-10-CM | POA: Diagnosis present

## 2017-10-20 DIAGNOSIS — Z79899 Other long term (current) drug therapy: Secondary | ICD-10-CM | POA: Insufficient documentation

## 2017-10-20 DIAGNOSIS — Y939 Activity, unspecified: Secondary | ICD-10-CM | POA: Insufficient documentation

## 2017-10-20 DIAGNOSIS — I10 Essential (primary) hypertension: Secondary | ICD-10-CM | POA: Insufficient documentation

## 2017-10-20 DIAGNOSIS — Z87891 Personal history of nicotine dependence: Secondary | ICD-10-CM | POA: Insufficient documentation

## 2017-10-20 HISTORY — DX: Unspecified atrial fibrillation: I48.91
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
## 2017-10-20 NOTE — ED Notes (Signed)
Pt verbalized understanding d/c instructions. 

## 2017-10-20 NOTE — Discharge Instructions (Signed)
You may take Tylenol for your symptoms.  Please follow-up with your regular doctor in 1 week and return to the ER for any new or worsening symptoms in the meantime.
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
## 2017-10-20 NOTE — Progress Notes (Signed)
Cardiology Office Note:    Date:  10/21/2017   ID:  David EGNOR, DOB 01-20-1946, MRN [id number]  PCP:  [name], MD  Cardiologist:  Lesleigh Noe, MD   Referring MD: [name], MD   Chief Complaint  Patient presents with  . Atrial Fibrillation    History of Present Illness:    [name] is a 72 y.o. male with a hx of myasthenia gravis, recently admitted with atrial fibrillation and underwent electrical cardioversion to sinus rhythm within the last 72 hours. Calledthis morning because heart rate was elevated again and he is concerned there isrecurrent atrial fibrillation.Recurrent AF being treated with amiodarone loading and eventual repeated electrical cardioversion.  He has been seen by EP, Camnitz, and is being managed for rate control given severely dilated left atrium.  Overall doing relatively well.  He denies impairment of quality of life due to dyspnea or angina.  With real heavy physical activity he will develop mild chest tightness.  He occasionally feels palpitations.  He does have an [employer] program on his mobile device and has demonstrated blood pressures and heart rates that have been reasonable.  Generally speaking the peak heart rates are less than 150 and the average resting heart rates are in the 60 to 70 bpm range.   Past Medical History:  Diagnosis Date  . A-fib (HCC)   . GERD (gastroesophageal reflux disease)   . History of Bell's palsy   . History of pericarditis   . Hypertension   . Mixed hyperlipidemia   . Myasthenia gravis (HCC)    currently in remission (11/2014)     Past Surgical History:  Procedure Laterality Date  . CARDIOVERSION N/A 09/21/2016   Procedure: CARDIOVERSION;  Surgeon: Thurmon Fair, MD;  Location: [facility] ENDOSCOPY;  Service: Cardiovascular;  Laterality: N/A;  . CARDIOVERSION N/A 10/17/2016   Procedure: CARDIOVERSION;  Surgeon: Laurey Morale, MD;  Location: [facility] ENDOSCOPY;  Service: Cardiovascular;   Laterality: N/A;  . CARDIOVERSION N/A 10/31/2016   Procedure: CARDIOVERSION;  Surgeon: Wendall Stade, MD;  Location: [facility] ENDOSCOPY;  Service: Cardiovascular;  Laterality: N/A;  . LAMINECTOMY  1991  . TEE WITHOUT CARDIOVERSION N/A 09/21/2016   Procedure: TRANSESOPHAGEAL ECHOCARDIOGRAM (TEE);  Surgeon: Thurmon Fair, MD;  Location: [facility] ENDOSCOPY;  Service: Cardiovascular;  Laterality: N/A;  . TEE WITHOUT CARDIOVERSION N/A 10/31/2016   Procedure: TRANSESOPHAGEAL ECHOCARDIOGRAM (TEE);  Surgeon: Wendall Stade, MD;  Location: [facility] ENDOSCOPY;  Service: Cardiovascular;  Laterality: N/A;  . TONSILLECTOMY  1950    Current Medications: Current Meds  Medication Sig  . Alpha-Lipoic Acid 300 MG CAPS Take 300 mg by mouth daily.   Marland Kitchen atorvastatin (LIPITOR) 20 MG tablet TAKE 1 TABLET BY MOUTH DAILY  . Coenzyme Q10 (COQ-10) 100 MG CAPS Take 100 mg by mouth daily.   Marland Kitchen DIGOX 250 MCG tablet TAKE 1 TABLET(0.25 MG) BY MOUTH DAILY  . ELIQUIS 5 MG TABS tablet TAKE 1 TABLET BY MOUTH TWICE DAILY  . Multiple Vitamins-Minerals (CENTRUM SILVER PO) Take 1 tablet by mouth daily.  . mycophenolate (CELLCEPT) 500 MG tablet Take 1 tablet (500 mg total) by mouth 2 (two) times daily.  . Omega-3 Fatty Acids (OMEGA-3 PO) Take 1,040 mg by mouth daily.  Marland Kitchen pyridostigmine (MESTINON) 60 MG tablet Take 1 tablet (60 mg total) by mouth 3 (three) times daily as needed.  . ramipril (ALTACE) 5 MG capsule Take 5 mg by mouth daily.  Marland Kitchen Resveratrol 100 MG CAPS Take 100 mg by mouth  2 (two) times daily.   . tamsulosin (FLOMAX) 0.4 MG CAPS capsule Take 0.4 mg by mouth daily.  Marland Kitchen triamterene-hydrochlorothiazide (MAXZIDE-25) 37.5-25 MG tablet Take 2 tablets by mouth daily.  . TURMERIC PO Take 100 mg by mouth 2 (two) times daily.     Allergies:   Demerol [meperidine]; Aminoglycosides; Beta adrenergic blockers; Calcium channel blockers; Ciprofloxacin; Iodinated diagnostic agents; Penicillamine; Procainamide hcl; Quinine derivatives; Succinylcholine  chloride; and Vecuronium bromide [vecuronium]   Social History   Socioeconomic History  . Marital status: Married    Spouse name: Not on file  . Number of children: 1  . Years of education: Grad Sch  . Highest education level: Not on file  Occupational History  . Occupation: Pharmacologist  Social Needs  . Financial resource strain: Not on file  . Food insecurity:    Worry: Not on file    Inability: Not on file  . Transportation needs:    Medical: Not on file    Non-medical: Not on file  Tobacco Use  . Smoking status: Former Smoker    Types: Pipe    Last attempt to quit: 12/10/1966    Years since quitting: 50.8  . Smokeless tobacco: Never Used  Substance and Sexual Activity  . Alcohol use: Yes    Alcohol/week: 1.0 standard drinks    Types: 1 Glasses of wine per week  . Drug use: No  . Sexual activity: Not on file  Lifestyle  . Physical activity:    Days per week: Not on file    Minutes per session: Not on file  . Stress: Not on file  Relationships  . Social connections:    Talks on phone: Not on file    Gets together: Not on file    Attends religious service: Not on file    Active member of club or organization: Not on file    Attends meetings of clubs or organizations: Not on file    Relationship status: Not on file  Other Topics Concern  . Not on file  Social History Narrative   Lives at home with his wife.   Right-handed.   Occasional caffeine use.     Family History: The patient's family history includes Cancer in his brother and mother; Healthy in his daughter; Heart disease in his father.  ROS:   Please see the history of present illness.    Myasthenia gravis has been under relatively good control.  No recent flares.  All other systems reviewed and are negative.  EKGs/Labs/Other Studies Reviewed:    The following studies were reviewed today: None.  EKG:  EKG is not ordered today.  Most recent tracing was performed 12/19/2016 and demonstrated  atrial fib with controlled ventricular response.  Recent Labs: 05/29/2017: ALT 21; BUN 15; Creatinine, Ser 0.79; Hemoglobin 14.7; Platelets 184; Potassium 3.9; Sodium 140; TSH 1.150  Recent Lipid Panel    Component Value Date/Time   CHOL 144 09/13/2015 1041   CHOL 218 (H) 12/14/2014 0915   TRIG 60 09/13/2015 1041   TRIG 77 12/14/2014 0915   HDL 56 09/13/2015 1041   HDL 52 12/14/2014 0915   CHOLHDL 2.6 09/13/2015 1041   VLDL 12 09/13/2015 1041   LDLCALC 76 09/13/2015 1041   LDLCALC 151 (H) 12/14/2014 0915    Physical Exam:    VS:  BP 122/84   Pulse 66   Ht 5\' 7"  (1.702 m)   Wt 216 lb 3.2 oz (98.1 kg)   BMI 33.86 kg/m  Wt Readings from Last 3 Encounters:  10/21/17 216 lb 3.2 oz (98.1 kg)  10/20/17 212 lb (96.2 kg)  05/29/17 216 lb (98 kg)     GEN: Moderate obesity. well nourished, well developed in no acute distress HEENT: Normal NECK: No JVD. LYMPHATICS: No lymphadenopathy CARDIAC: iiRR, 2/6 right upper sternal border systolic murmur, no gallop, no edema. VASCULAR: 2+ bilateral radial pulses.  No bruits. RESPIRATORY:  Clear to auscultation without rales, wheezing or rhonchi  ABDOMEN: Soft, non-tender, non-distended, No pulsatile mass, MUSCULOSKELETAL: No deformity  SKIN: Warm and dry NEUROLOGIC:  Alert and oriented x 3 PSYCHIATRIC:  Normal affect   ASSESSMENT:    1. Chronic atrial fibrillation (HCC)   2. Essential hypertension   3. Chronic anticoagulation   4. Ascending aortic aneurysm (HCC)   5. High coronary artery calcium score    PLAN:    In order of problems listed above:  1. 2D Doppler echocardiogram in 5 months to assess LV function.  Heart rate seems to be under good control based upon his wearable cardiac monitor. 2. Excellent control. 3. No bleeding tendencies on apixaban. 4. Not assessed 5. Risk factor modification  Clinical follow-up in 6 months.  This should be preceded by an echocardiogram 5 months from now.  Determine via his  neurologist whether or not low-dose beta-blocker therapy or calcium antagonist therapy could be used.   Medication Adjustments/Labs and Tests Ordered: Current medicines are reviewed at length with the patient today.  Concerns regarding medicines are outlined above.  Orders Placed This Encounter  Procedures  . ECHOCARDIOGRAM COMPLETE   No orders of the defined types were placed in this encounter.   Patient Instructions  Medication Instructions:  Your physician recommends that you continue on your current medications as directed. Please refer to the Current Medication list given to you today.   Labwork: none  Testing/Procedures: Your physician has requested that you have an echocardiogram. Echocardiography is a painless test that uses sound waves to create images of your heart. It provides your doctor with information about the size and shape of your heart and how well your heart's chambers and valves are working. This procedure takes approximately one hour. There are no restrictions for this procedure.  To be done in 5 months--Feb 2020  Follow-Up: Your physician wants you to follow-up in: 6 months.  You will receive a reminder letter in the mail two months in advance. If you don't receive a letter, please call our office to schedule the follow-up appointment.   Any Other Special Instructions Will Be Listed Below (If Applicable).     If you need a refill on your cardiac medications before your next appointment, please call your pharmacy.      Signed, Lesleigh Noe, MD  10/21/2017 5:17 PM    [facility]

## 2017-10-20 NOTE — ED Notes (Signed)
Bed: WTR8 Expected date:  Expected time:  Means of arrival:  Comments: 

## 2017-10-21 ENCOUNTER — Encounter: Payer: Self-pay | Admitting: Interventional Cardiology

## 2017-10-21 ENCOUNTER — Ambulatory Visit: Payer: Medicare Other | Admitting: Interventional Cardiology

## 2017-10-21 VITALS — BP 122/84 | HR 66 | Ht 67.0 in | Wt 216.2 lb

## 2017-10-21 DIAGNOSIS — Z7901 Long term (current) use of anticoagulants: Secondary | ICD-10-CM

## 2017-10-21 DIAGNOSIS — I482 Chronic atrial fibrillation, unspecified: Secondary | ICD-10-CM

## 2017-10-21 DIAGNOSIS — I7121 Aneurysm of the ascending aorta, without rupture: Secondary | ICD-10-CM

## 2017-10-21 DIAGNOSIS — I712 Thoracic aortic aneurysm, without rupture: Secondary | ICD-10-CM

## 2017-10-21 DIAGNOSIS — I10 Essential (primary) hypertension: Secondary | ICD-10-CM

## 2017-10-21 DIAGNOSIS — R931 Abnormal findings on diagnostic imaging of heart and coronary circulation: Secondary | ICD-10-CM

## 2017-10-21 NOTE — Patient Instructions (Signed)
Medication Instructions:  Your physician recommends that you continue on your current medications as directed. Please refer to the Current Medication list given to you today.   Labwork: none  Testing/Procedures: Your physician has requested that you have an echocardiogram. Echocardiography is a painless test that uses sound waves to create images of your heart. It provides your doctor with information about the size and shape of your heart and how well your heart's chambers and valves are working. This procedure takes approximately one hour. There are no restrictions for this procedure.  To be done in 5 months--Feb 2020  Follow-Up: Your physician wants you to follow-up in: 6 months.  You will receive a reminder letter in the mail two months in advance. If you don't receive a letter, please call our office to schedule the follow-up appointment.   Any Other Special Instructions Will Be Listed Below (If Applicable).     If you need a refill on your cardiac medications before your next appointment, please call your pharmacy.

## 2017-10-31 ENCOUNTER — Telehealth: Payer: Self-pay | Admitting: Neurology
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
## 2017-11-03 ENCOUNTER — Other Ambulatory Visit: Payer: Self-pay

## 2017-11-03 ENCOUNTER — Encounter (HOSPITAL_BASED_OUTPATIENT_CLINIC_OR_DEPARTMENT_OTHER): Payer: Self-pay | Admitting: Emergency Medicine

## 2017-11-03 ENCOUNTER — Emergency Department (HOSPITAL_BASED_OUTPATIENT_CLINIC_OR_DEPARTMENT_OTHER)
Admission: EM | Admit: 2017-11-03 | Discharge: 2017-11-03 | Disposition: A | Discharge status: Home / Self Care | Payer: Medicare Other | Attending: Emergency Medicine | Admitting: Emergency Medicine

## 2017-11-03 ENCOUNTER — Emergency Department (HOSPITAL_BASED_OUTPATIENT_CLINIC_OR_DEPARTMENT_OTHER): Payer: Medicare Other

## 2017-11-03 DIAGNOSIS — R42 Dizziness and giddiness: Secondary | ICD-10-CM | POA: Diagnosis not present

## 2017-11-03 DIAGNOSIS — Z7901 Long term (current) use of anticoagulants: Secondary | ICD-10-CM | POA: Diagnosis not present

## 2017-11-03 DIAGNOSIS — Z79899 Other long term (current) drug therapy: Secondary | ICD-10-CM | POA: Insufficient documentation

## 2017-11-03 DIAGNOSIS — Z87891 Personal history of nicotine dependence: Secondary | ICD-10-CM | POA: Diagnosis not present

## 2017-11-03 DIAGNOSIS — I10 Essential (primary) hypertension: Secondary | ICD-10-CM | POA: Insufficient documentation

## 2017-11-03 DIAGNOSIS — H532 Diplopia: Secondary | ICD-10-CM | POA: Diagnosis not present

## 2017-11-03 LAB — CBC WITH DIFFERENTIAL/PLATELET
Basophils Absolute: 0 10*3/uL (ref 0.0–0.1)
Basophils Relative: 0 %
EOS PCT: 3 %
Eosinophils Absolute: 0.2 10*3/uL (ref 0.0–0.7)
HCT: 45.1 % (ref 39.0–52.0)
HEMOGLOBIN: 15 g/dL (ref 13.0–17.0)
LYMPHS ABS: 1.1 10*3/uL (ref 0.7–4.0)
Lymphocytes Relative: 12 %
MCH: 31.4 pg (ref 26.0–34.0)
MCHC: 33.3 g/dL (ref 30.0–36.0)
MCV: 94.4 fL (ref 78.0–100.0)
Monocytes Absolute: 0.8 10*3/uL (ref 0.1–1.0)
Monocytes Relative: 9 %
NEUTROS PCT: 76 %
Neutro Abs: 6.9 10*3/uL (ref 1.7–7.7)
PLATELETS: 166 10*3/uL (ref 150–400)
RBC: 4.78 MIL/uL (ref 4.22–5.81)
RDW: 13.7 % (ref 11.5–15.5)
WBC: 9 10*3/uL (ref 4.0–10.5)

## 2017-11-03 LAB — COMPREHENSIVE METABOLIC PANEL
ALBUMIN: 3.8 g/dL (ref 3.5–5.0)
ALT: 24 U/L (ref 0–44)
ANION GAP: 9 (ref 5–15)
AST: 25 U/L (ref 15–41)
Alkaline Phosphatase: 83 U/L (ref 38–126)
BUN: 19 mg/dL (ref 8–23)
CHLORIDE: 101 mmol/L (ref 98–111)
CO2: 27 mmol/L (ref 22–32)
Calcium: 9 mg/dL (ref 8.9–10.3)
Creatinine, Ser: 0.7 mg/dL (ref 0.61–1.24)
GFR calc non Af Amer: >60 mL/min (ref >60)
GLUCOSE: 98 mg/dL (ref 70–99)
Potassium: 3.6 mmol/L (ref 3.5–5.1)
Sodium: 137 mmol/L (ref 135–145)
Total Bilirubin: 0.4 mg/dL (ref 0.3–1.2)
Total Protein: 6.5 g/dL (ref 6.5–8.1)

## 2017-11-03 LAB — URINALYSIS, ROUTINE W REFLEX MICROSCOPIC
Bilirubin Urine: NEGATIVE
GLUCOSE, UA: NEGATIVE mg/dL
Hgb urine dipstick: NEGATIVE
Ketones, ur: NEGATIVE mg/dL
LEUKOCYTES UA: NEGATIVE
NITRITE: NEGATIVE
PH: 6.5 (ref 5.0–8.0)
Protein, ur: NEGATIVE mg/dL
SPECIFIC GRAVITY, URINE: 1.02 (ref 1.005–1.030)

## 2017-11-03 LAB — DIGOXIN LEVEL: Digoxin Level: 0.7 ng/mL — ABNORMAL LOW (ref 0.8–2.0)

## 2017-11-03 MED ORDER — MECLIZINE HCL 25 MG PO TABS
25.0000 mg | ORAL_TABLET | Freq: Once | ORAL | Status: DC
  Filled 2017-11-03: qty 1
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
## 2017-11-03 NOTE — Discharge Instructions (Addendum)
Use meclizine as needed for persistent dizziness. Follow-up with your primary care doctor and neurologist regarding your symptoms tomorrow. Return to the emergency room with any new, worsening, or concerning symptoms.

## 2017-11-03 NOTE — ED Provider Notes (Signed)
Medical screening examination/treatment/procedure(s) were conducted as a shared visit with non-physician practitioner(s) and myself.  I personally evaluated the patient during the encounter.  EKG Interpretation  Date/Time:  Sunday November 03 2017 14:19:02 EDT Ventricular Rate:  84 PR Interval:    QRS Duration: 108 QT Interval:  354 QTC Calculation: 418 R Axis:   49 Text Interpretation:  Atrial fibrillation Incomplete right bundle branch block Cannot rule out Anterior infarct , age undetermined No significant change since last tracing Confirmed by Gwyneth SproutPlunkett, Whitney ([id number]) on 11/03/2017 2:23:59 PM Patient had dizziness that started 4 days ago quite suddenly with intense spinning sensation.  Patient had been sitting on the toilet and put his head forward into his hands and symptoms started very suddenly at that time.  Since then there were several episodes that would come and go.  Patient however is also had some double vision that is been episodic.  He has known history of myasthenia gravis.  She is not having any active double vision.  He reports that brisk motions will still give him spinning quality dizziness.  He takes Eliquis for chronic A. Fib.  Is alert and appropriate.  Mental status clear.  Neurologic exam normal.  Heart irregularly irregular.  Lungs clear.  CT head is rule out any intracranial bleeding or mass-effect.  Patient does not have any neurologic deficits.  Dizziness certainly has the sound of benign positional vertigo.  Complicating factor is intermittent double vision which patient generally states occurs when he is had some ongoing visual strain, then corrects again.  It sounds like this is likely due to the myasthenia gravis.  Patient clinically well he does not have any apparent progressive symptoms of myasthenia.  He is already anticoagulated on Eliquis.  His CT does not show any intracranial bleed or other acute findings.  I have discussed plan with the patient at length.   They will call the neurologist in the morning to discuss ongoing management.  Patient is stable for discharge.   Arby BarrettePfeiffer, Shakari Qazi, MD 11/03/17 [id number]

## 2017-11-03 NOTE — ED Notes (Signed)
Patient transported to CT 

## 2017-11-03 NOTE — ED Notes (Signed)
Pt ambulated to bathroom to collect urine sample. When patient returned RN went into the room to collect sample. Pt was sitting on the bed and laid back and stated that he started to feel dizzy. RN connected patient back to monitor and noticed that the patients heart rate was in the 130s. Provider notified

## 2017-11-03 NOTE — ED Notes (Signed)
ED Provider at bedside. 
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
## 2017-11-03 NOTE — ED Notes (Signed)
Pt refuse Meclizine reports "don't want to mask the dizziness before we find out what is wrong."
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
## 2017-11-04 ENCOUNTER — Telehealth: Payer: Self-pay | Admitting: Neurology

## 2017-11-04 ENCOUNTER — Ambulatory Visit: Payer: Medicare Other | Admitting: Neurology

## 2017-11-04 ENCOUNTER — Encounter: Payer: Self-pay | Admitting: Neurology

## 2017-11-04 VITALS — BP 120/74 | HR 71 | Ht 67.0 in | Wt 212.8 lb

## 2017-11-04 DIAGNOSIS — H81399 Other peripheral vertigo, unspecified ear: Secondary | ICD-10-CM

## 2017-11-04 DIAGNOSIS — I482 Chronic atrial fibrillation, unspecified: Secondary | ICD-10-CM

## 2017-11-04 DIAGNOSIS — R42 Dizziness and giddiness: Secondary | ICD-10-CM

## 2017-11-04 DIAGNOSIS — G7000 Myasthenia gravis without (acute) exacerbation: Secondary | ICD-10-CM

## 2017-11-04 DIAGNOSIS — A881 Epidemic vertigo: Secondary | ICD-10-CM | POA: Diagnosis not present

## 2017-11-04 MED ORDER — PYRIDOSTIGMINE BROMIDE 60 MG PO TABS: 60.0000 mg | ORAL_TABLET | Freq: Three times a day (TID) | ORAL | 3 refills | Status: DC | PRN
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
## 2017-11-04 NOTE — Telephone Encounter (Signed)
Patient called back.  Dr. Terrace ArabiaYan is going to evaluate him today.  He has been added to her schedule.

## 2017-11-04 NOTE — Telephone Encounter (Signed)
[employer] order sent to [facility]. No auth they will reach out to the pt to schedule.  °

## 2017-11-04 NOTE — Telephone Encounter (Signed)
Left message requesting  call back.

## 2017-11-04 NOTE — Progress Notes (Signed)
Please ad Toprol xl 25 mg daily.

## 2017-11-04 NOTE — Progress Notes (Signed)
Dr. Terrace ArabiaYan, please ignore my prior note.  We will start toprol XL and eventually decrease/DC digoxin.  Victorino DikeJennifer please have him start Troprol XL.

## 2017-11-04 NOTE — Telephone Encounter (Signed)
Pt has called to have Dr Terrace ArabiaYan made aware that he went to the ED on yesterday and the provider that he saw thought she saw signs of fatigue in his left eye.  Pt is asking for a call to discuss

## 2017-11-05 ENCOUNTER — Telehealth: Payer: Self-pay | Admitting: Interventional Cardiology

## 2017-11-05 MED ORDER — METOPROLOL SUCCINATE ER 25 MG PO TB24: 25.0000 mg | ORAL_TABLET | Freq: Every day | ORAL | 3 refills | Status: DC

## 2017-11-05 NOTE — Telephone Encounter (Signed)
[name], David W, MD at 11/04/2017 12:00 PM   Status: Signed    Please ad Toprol xl 25 mg daily.    [name], David W, MD at 11/04/2017 12:00 PM   Status: Signed    [name], please ignore my prior note.  We will start toprol XL and eventually decrease/DC digoxin.  [name] please have him start Troprol XL.      Spoke with pt and went over recommendations per Dr. Katrinka BlazingSmith.  Advised pt to monitor HR and BP and let us know if either gets too low.  Went over parameters.  Pt verbalized understanding and was in agreement with this plan.

## 2017-11-07 ENCOUNTER — Ambulatory Visit: Payer: Medicare Other | Attending: Family Medicine | Admitting: Physical Therapy

## 2017-11-07 ENCOUNTER — Other Ambulatory Visit: Payer: Self-pay

## 2017-11-07 ENCOUNTER — Encounter: Payer: Self-pay | Admitting: Physical Therapy

## 2017-11-07 DIAGNOSIS — R2681 Unsteadiness on feet: Secondary | ICD-10-CM | POA: Diagnosis present

## 2017-11-07 DIAGNOSIS — R42 Dizziness and giddiness: Secondary | ICD-10-CM
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
## 2017-11-08 ENCOUNTER — Other Ambulatory Visit: Payer: Self-pay | Admitting: Neurology

## 2017-11-08 ENCOUNTER — Telehealth: Payer: Self-pay | Admitting: Neurology

## 2017-11-08 DIAGNOSIS — IMO0001 Reserved for inherently not codable concepts without codable children: Secondary | ICD-10-CM

## 2017-11-08 NOTE — Progress Notes (Signed)
I have add on order for MRA of neck w/wo, He is on schedule for MRA neck on Sept 29th 2019, please get pre authorization before that

## 2017-11-08 NOTE — Telephone Encounter (Signed)
  I have add on MRA neck w/wo, please get preauthorization, he is on schedule for Sept 29th,

## 2017-11-10 ENCOUNTER — Ambulatory Visit
Admission: RE | Admit: 2017-11-10 | Discharge: 2017-11-10 | Disposition: A | Discharge status: Home / Self Care | Payer: Medicare Other | Source: Ambulatory Visit | Attending: Neurology | Admitting: Neurology

## 2017-11-10 ENCOUNTER — Other Ambulatory Visit: Payer: Medicare Other

## 2017-11-10 DIAGNOSIS — H81399 Other peripheral vertigo, unspecified ear: Secondary | ICD-10-CM | POA: Diagnosis not present

## 2017-11-10 DIAGNOSIS — A881 Epidemic vertigo: Secondary | ICD-10-CM

## 2017-11-11 NOTE — Telephone Encounter (Signed)
Noted, I was not here on Friday. He only had the MRA head and MRI brain on Sept. 29. I will send the MRA Neck order to GI and he will be schedule there.

## 2017-11-12 ENCOUNTER — Telehealth: Payer: Self-pay | Admitting: Neurology

## 2017-11-12 NOTE — Telephone Encounter (Signed)
Please call patient, MRI I of brain showed generalized atrophy, mild supratentorium small vessel disease, MRA of the brain showed no significant large vessel disease,   IMPRESSION: This MRI of the brain without contrast shows the following: 1.   Mild to moderate generalized cortical atrophy. 2.   Mild chronic microvascular ischemic changes. 3.   There are no acute findings.  IMPRESSION: This MR angiogram of the intracranial arteries shows the following: 1.    There is a persistent primitive right trigeminal artery, a normal variant.    Associated with this variant, the vertebral arteries are diminutive. 2.    There is no significant stenosis noted.  There are no aneurysms.

## 2017-11-12 NOTE — Telephone Encounter (Signed)
Spoke to patient - he is aware of his MRI and MRA results and verbalized understanding.

## 2017-11-14 ENCOUNTER — Other Ambulatory Visit: Payer: Self-pay | Admitting: Neurology

## 2017-11-14 DIAGNOSIS — R42 Dizziness and giddiness: Secondary | ICD-10-CM
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
## 2017-11-17 ENCOUNTER — Other Ambulatory Visit: Payer: Medicare Other

## 2017-11-18 ENCOUNTER — Ambulatory Visit
Admission: RE | Admit: 2017-11-18 | Discharge: 2017-11-18 | Disposition: A | Discharge status: Home / Self Care | Payer: Medicare Other | Source: Ambulatory Visit | Attending: Neurology | Admitting: Neurology

## 2017-11-18 ENCOUNTER — Telehealth: Payer: Self-pay | Admitting: Neurology

## 2017-11-18 DIAGNOSIS — R42 Dizziness and giddiness: Secondary | ICD-10-CM | POA: Diagnosis not present

## 2017-11-18 MED ORDER — GADOBENATE DIMEGLUMINE 529 MG/ML IV SOLN
20.0000 mL | Freq: Once | INTRAVENOUS | Status: AC | PRN
  Administered 2017-11-18: 20 mL via INTRAVENOUS

## 2017-11-18 NOTE — Telephone Encounter (Signed)
States he has started taking Mestinon 60mg , BID.  He feels his double vision/blurriness has improved.

## 2017-11-18 NOTE — Telephone Encounter (Signed)
Spoke to patient - he is aware of his MRA results.

## 2017-11-18 NOTE — Telephone Encounter (Signed)
Please call patient, MRA of neck showed normal anatomical variations, there is no evidence of significant stenosis or dissection  IMPRESSION:    This MR angiogram of the neck arteries shows the following: 1.     There is no stenosis or dissection noted in the carotid or vertebral arteries. 2.     Incidental note is made of diminutive V4 segments of the vertebral arteries and a diminutive basilar artery associated with a prominent right persistent primitive trigeminal artery, a normal but uncommon variant.

## 2017-11-19 NOTE — Telephone Encounter (Signed)
Start Toprol. Let us know if it causes problems.

## 2017-11-20 NOTE — Telephone Encounter (Signed)
Sent patient a message to stop Toprol.  Discontinued from medication list.

## 2017-11-21 ENCOUNTER — Other Ambulatory Visit: Payer: Self-pay | Admitting: *Deleted

## 2017-11-21 MED ORDER — METOPROLOL SUCCINATE ER 25 MG PO TB24: 25.0000 mg | ORAL_TABLET | Freq: Every day | ORAL | 3 refills | Status: DC

## 2017-11-28 ENCOUNTER — Encounter (INDEPENDENT_AMBULATORY_CARE_PROVIDER_SITE_OTHER): Payer: Medicare Other | Admitting: Ophthalmology

## 2017-11-28 ENCOUNTER — Ambulatory Visit: Payer: Medicare Other | Admitting: Neurology

## 2017-11-29 ENCOUNTER — Encounter (INDEPENDENT_AMBULATORY_CARE_PROVIDER_SITE_OTHER): Payer: Medicare Other | Admitting: Ophthalmology

## 2017-11-29 DIAGNOSIS — I10 Essential (primary) hypertension: Secondary | ICD-10-CM

## 2017-11-29 DIAGNOSIS — H353112 Nonexudative age-related macular degeneration, right eye, intermediate dry stage: Secondary | ICD-10-CM | POA: Diagnosis not present

## 2017-11-29 DIAGNOSIS — H353221 Exudative age-related macular degeneration, left eye, with active choroidal neovascularization: Secondary | ICD-10-CM | POA: Diagnosis not present

## 2017-11-29 DIAGNOSIS — H35033 Hypertensive retinopathy, bilateral: Secondary | ICD-10-CM

## 2017-11-29 DIAGNOSIS — H43813 Vitreous degeneration, bilateral: Secondary | ICD-10-CM

## 2017-11-29 DIAGNOSIS — H2513 Age-related nuclear cataract, bilateral: Secondary | ICD-10-CM

## 2017-12-02 ENCOUNTER — Emergency Department (HOSPITAL_BASED_OUTPATIENT_CLINIC_OR_DEPARTMENT_OTHER)
Admission: EM | Admit: 2017-12-02 | Discharge: 2017-12-02 | Disposition: A | Discharge status: Home / Self Care | Payer: Medicare Other | Attending: Emergency Medicine | Admitting: Emergency Medicine

## 2017-12-02 ENCOUNTER — Encounter (HOSPITAL_BASED_OUTPATIENT_CLINIC_OR_DEPARTMENT_OTHER): Payer: Self-pay | Admitting: *Deleted

## 2017-12-02 ENCOUNTER — Emergency Department (HOSPITAL_BASED_OUTPATIENT_CLINIC_OR_DEPARTMENT_OTHER): Payer: Medicare Other

## 2017-12-02 ENCOUNTER — Other Ambulatory Visit: Payer: Self-pay

## 2017-12-02 DIAGNOSIS — M25552 Pain in left hip: Secondary | ICD-10-CM | POA: Diagnosis present

## 2017-12-02 DIAGNOSIS — I10 Essential (primary) hypertension: Secondary | ICD-10-CM | POA: Insufficient documentation

## 2017-12-02 DIAGNOSIS — Z7901 Long term (current) use of anticoagulants: Secondary | ICD-10-CM | POA: Insufficient documentation

## 2017-12-02 DIAGNOSIS — Z79899 Other long term (current) drug therapy: Secondary | ICD-10-CM | POA: Insufficient documentation

## 2017-12-02 DIAGNOSIS — F17290 Nicotine dependence, other tobacco product, uncomplicated: Secondary | ICD-10-CM | POA: Insufficient documentation

## 2017-12-02 MED ORDER — HYDROMORPHONE HCL 1 MG/ML IJ SOLN
1.0000 mg | Freq: Once | INTRAMUSCULAR | Status: DC
  Filled 2017-12-02: qty 1

## 2017-12-02 MED ORDER — DEXAMETHASONE SODIUM PHOSPHATE 10 MG/ML IJ SOLN
10.0000 mg | Freq: Once | INTRAMUSCULAR | Status: DC
  Filled 2017-12-02: qty 1

## 2017-12-02 MED ORDER — ACETAMINOPHEN 325 MG PO TABS
650.0000 mg | ORAL_TABLET | Freq: Once | ORAL | Status: AC
  Administered 2017-12-02: 650 mg via ORAL
  Filled 2017-12-02: qty 2

## 2017-12-02 NOTE — ED Triage Notes (Signed)
Left hip pain since this afternoon. No injury.

## 2017-12-02 NOTE — ED Provider Notes (Signed)
[facility] EMERGENCY DEPARTMENT Provider Note   CSN: [id number] Arrival date & time: 12/02/17  1916     History   Chief Complaint Chief Complaint  Patient presents with  . Hip Pain    HPI DAYTON KENLEY is a 72 y.o. male.  Patient presents to the ER for evaluation of left hip pain.  Patient reports that the pain began earlier today.  Pain is on the lateral aspect of the left hip and also feels like it is "deep in the joint".  He denies any injury.  Pain began earlier today and has worsened through the day.  He reports that he will have intermittent episodes of a sharp stabbing pain in the area the last for 10 to 15 seconds and then resolved.  Reports that the pain is become more frequent through the day.  He has not identified anything that can cause the pain, however.  Is not related to movement of the hip.  He denies any pain above the hip and the back.  Pain does not radiate down the leg.  No numbness, tingling, weakness of lower extremities.  Denies urinary symptoms.     Past Medical History:  Diagnosis Date  . A-fib (HCC)   . GERD (gastroesophageal reflux disease)   . History of Bell's palsy   . History of pericarditis   . Hypertension   . Mixed hyperlipidemia   . Myasthenia gravis (HCC)    currently in remission (11/2014)     Patient Active Problem List   Diagnosis Date Noted  . Vertigo 11/04/2017  . Chronic atrial fibrillation 11/28/2016  . Hypertension 09/20/2016  . Benign prostatic hyperplasia without lower urinary tract symptoms   . Myasthenia gravis (HCC) 03/26/2016  . Hyperlipidemia 09/12/2015  . Erectile dysfunction 09/12/2015  . History of pericarditis 12/10/2014  . High coronary artery calcium score 12/10/2014    Past Surgical History:  Procedure Laterality Date  . CARDIOVERSION N/A 09/21/2016   Procedure: CARDIOVERSION;  Surgeon: Thurmon Fair, MD;  Location: [facility] ENDOSCOPY;  Service: Cardiovascular;  Laterality: N/A;  .  CARDIOVERSION N/A 10/17/2016   Procedure: CARDIOVERSION;  Surgeon: Laurey Morale, MD;  Location: [facility] ENDOSCOPY;  Service: Cardiovascular;  Laterality: N/A;  . CARDIOVERSION N/A 10/31/2016   Procedure: CARDIOVERSION;  Surgeon: Wendall Stade, MD;  Location: [facility] ENDOSCOPY;  Service: Cardiovascular;  Laterality: N/A;  . LAMINECTOMY  1991  . TEE WITHOUT CARDIOVERSION N/A 09/21/2016   Procedure: TRANSESOPHAGEAL ECHOCARDIOGRAM (TEE);  Surgeon: Thurmon Fair, MD;  Location: [facility] ENDOSCOPY;  Service: Cardiovascular;  Laterality: N/A;  . TEE WITHOUT CARDIOVERSION N/A 10/31/2016   Procedure: TRANSESOPHAGEAL ECHOCARDIOGRAM (TEE);  Surgeon: Wendall Stade, MD;  Location: [facility] ENDOSCOPY;  Service: Cardiovascular;  Laterality: N/A;  . TONSILLECTOMY  1950        Home Medications    Prior to Admission medications   Medication Sig Start Date End Date Taking? Authorizing Provider  Alpha-Lipoic Acid 300 MG CAPS Take 300 mg by mouth daily.     [provider]  atorvastatin (LIPITOR) 20 MG tablet TAKE 1 TABLET BY MOUTH DAILY 10/12/16   Lyn Records, MD  Coenzyme Q10 (COQ-10) 100 MG CAPS Take 100 mg by mouth daily.     [provider]  DIGOX 250 MCG tablet TAKE 1 TABLET(0.25 MG) BY MOUTH DAILY 09/23/17   Lyn Records, MD  ELIQUIS 5 MG TABS tablet TAKE 1 TABLET BY MOUTH TWICE DAILY 05/13/17   Lyn Records, MD  metoprolol succinate (  TOPROL-XL) 25 MG 24 hr tablet Take 1 tablet (25 mg total) by mouth daily. 11/21/17   Lyn Records, MD  Multiple Vitamins-Minerals (CENTRUM SILVER PO) Take 1 tablet by mouth daily.    [provider]  mycophenolate (CELLCEPT) 500 MG tablet Take 1 tablet (500 mg total) by mouth 2 (two) times daily. 05/29/17   Levert Feinstein, MD  Omega-3 Fatty Acids (OMEGA-3 PO) Take 1,040 mg by mouth daily.    [provider]  pyridostigmine (MESTINON) 60 MG tablet Take 1 tablet (60 mg total) by mouth 3 (three) times daily as needed. 11/04/17   Levert Feinstein, MD  ramipril  (ALTACE) 5 MG capsule Take 5 mg by mouth daily. 11/05/14   [provider]  Resveratrol 100 MG CAPS Take 100 mg by mouth 2 (two) times daily.     [provider]  tamsulosin (FLOMAX) 0.4 MG CAPS capsule Take 0.4 mg by mouth daily. 09/29/14   [provider]  triamterene-hydrochlorothiazide (MAXZIDE-25) 37.5-25 MG tablet Take 2 tablets by mouth daily. 09/21/16   Marcelino Duster, PA  TURMERIC PO Take 100 mg by mouth 2 (two) times daily.    [provider]    Family History Family History  Problem Relation Age of Onset  . Cancer Mother   . Heart disease Father   . Cancer Brother        bladder/ in remission  . Healthy Daughter        age 5    Social History Social History   Tobacco Use  . Smoking status: Former Smoker    Types: Pipe    Last attempt to quit: 12/10/1966    Years since quitting: 51.0  . Smokeless tobacco: Never Used  Substance Use Topics  . Alcohol use: Yes    Alcohol/week: 1.0 standard drinks    Types: 1 Glasses of wine per week  . Drug use: No     Allergies   Demerol [meperidine]; Aminoglycosides; Beta adrenergic blockers; Calcium channel blockers; Ciprofloxacin; Iodinated diagnostic agents; Penicillamine; Procainamide hcl; Quinine derivatives; Succinylcholine chloride; and Vecuronium bromide [vecuronium]   Review of Systems Review of Systems  Musculoskeletal: Positive for arthralgias. Negative for back pain.  All other systems reviewed and are negative.    Physical Exam Updated Vital Signs BP 121/69   Pulse 78   Temp 98.4 F (36.9 C) (Oral)   Resp 16   Ht 5\' 7"  (1.702 m)   Wt 98.1 kg   SpO2 98%   BMI 33.87 kg/m   Physical Exam  Constitutional: He is oriented to person, place, and time. He appears well-developed and well-nourished. No distress.  HENT:  Head: Normocephalic and atraumatic.  Right Ear: Hearing normal.  Left Ear: Hearing normal.  Nose: Nose normal.  Mouth/Throat: Oropharynx is clear and  moist and mucous membranes are normal.  Eyes: Pupils are equal, round, and reactive to light. Conjunctivae and EOM are normal.  Neck: Normal range of motion. Neck supple.  Cardiovascular: Regular rhythm, S1 normal and S2 normal. Exam reveals no gallop and no friction rub.  No murmur heard. Pulmonary/Chest: Effort normal and breath sounds normal. No respiratory distress. He exhibits no tenderness.  Abdominal: Soft. Normal appearance and bowel sounds are normal. There is no hepatosplenomegaly. There is no tenderness. There is no rebound, no guarding, no tenderness at McBurney's point and negative Murphy's sign. No hernia.  Musculoskeletal: Normal range of motion.       Right hip: He exhibits tenderness.  Legs: Neurological: He is alert and oriented to person, place, and time. He has normal strength. No cranial nerve deficit or sensory deficit. Coordination normal. GCS eye subscore is 4. GCS verbal subscore is 5. GCS motor subscore is 6.  Skin: Skin is warm, dry and intact. No rash noted. No cyanosis.  Psychiatric: He has a normal mood and affect. His speech is normal and behavior is normal. Thought content normal.  Nursing note and vitals reviewed.    ED Treatments / Results  Labs (all labs ordered are listed, but only abnormal results are displayed) Labs Reviewed - No data to display  EKG None  Radiology Dg Hip Unilat W Or Wo Pelvis 2-3 Views Left  Result Date: 12/02/2017 CLINICAL DATA:  Hip pain EXAM: DG HIP (WITH OR WITHOUT PELVIS) 2-3V LEFT COMPARISON:  None. FINDINGS: Mild SI joint degenerative change. Pubic symphysis and rami are intact. No fracture or malalignment. Joint space within normal limits. IMPRESSION: No acute osseous abnormality. Electronically Signed   By: Jasmine Pang M.D.   On: 12/02/2017 20:18    Procedures Procedures (including critical care time)  Medications Ordered in ED Medications  HYDROmorphone (DILAUDID) injection 1 mg (has no administration in  time range)  dexamethasone (DECADRON) injection 10 mg (has no administration in time range)  acetaminophen (TYLENOL) tablet 650 mg (650 mg Oral Given 12/02/17 1938)     Initial Impression / Assessment and Plan / ED Course  I have reviewed the triage vital signs and the nursing notes.  Pertinent labs & imaging results that were available during my care of the patient were reviewed by me and considered in my medical decision making (see chart for details).     Presents to the ER for evaluation of nontraumatic left hip pain.  He has point tenderness in the posterior lateral soft tissues in the area of the left hip, but there is no pain with active or passive flexion, extension, rotation, abduction or abduction at the hip.  He has normal distal pulses.  Overlying skin is normal, no concern for infection.  No lower leg swelling or calf tenderness.  X-ray does not show any acute abnormality.  Patient is not experiencing any flank or back pain.  This does not appear to be a radiculopathy.  Although it is not reproducible with range of motion in the bed, likely related to soft tissue inflammation.  Patient concerned about medications because of his myasthenia gravis.  Will provide him with a single dose of analgesia here in the ER as well as Decadron.  Final Clinical Impressions(s) / ED Diagnoses   Final diagnoses:  Left hip pain    ED Discharge Orders    None       Gilda Crease, MD 12/02/17 2336

## 2017-12-02 NOTE — ED Notes (Signed)
Pt refused IM medications. States does not want any injections.

## 2017-12-18 NOTE — Telephone Encounter (Signed)
Okay to stop digoxin.  Continue to monitor heart rate for persistent elevation greater than 90 bpm.  Blood pressure seems to be doing well.

## 2017-12-24 ENCOUNTER — Telehealth: Payer: Self-pay | Admitting: Interventional Cardiology

## 2017-12-24 ENCOUNTER — Other Ambulatory Visit: Payer: Self-pay

## 2017-12-24 NOTE — Telephone Encounter (Signed)
New Message        Patient is calling today because his meds were changed and he is now having trouble with his heart rate.   STAT if HR is under 50 or over 120 (normal HR is 60-100 beats per minute)  1) What is your heart rate? 125 @ 6:00 AM 08/22/2017  2) Do you have a log of your heart rate readings (document readings)?                79 @ 6:45 AM 08/23/2017                  3) Do you have any other symptoms? No

## 2017-12-24 NOTE — Telephone Encounter (Signed)
Pt called to report that he has been having problems with his heart rate since stopping his digoxin completely on Friday 12/20/17.Marland Kitchen. His heart rate has been fluctuating and elevated mostly in the evening hours.. He continues to take his metoprolol 25mg  at [facility]6am everyday. His BP has been running "normal" for him... He says that he has mild chest tightness when his HR is elevated but no dizziness, sob, or headache...  11/9   Hr 122, BP 113/87  In the evening 11/10  HR 130  BP 112/84  Evening 11/11 HR 129 not sure of his BP  He reports that when his HR is elevated he is checking it at rest and it usually lasts about an hour.   Advised him that I will forward message to Dr. Roney MansSmith/ Jennifer. We will call him back after Dr. Katrinka BlazingSmith reviews his message and call him back with his recommendations. Pt verbalized understanding.

## 2017-12-26 MED ORDER — DIGOXIN 250 MCG PO TABS: 0.2500 mg | ORAL_TABLET | Freq: Every day | ORAL | 3 refills | Status: DC

## 2017-12-26 NOTE — Telephone Encounter (Signed)
Per Dr. Eden EmmsNishan... DOD.... And he has advised pt to start back on his Digoxin 250mcg... Po qd.. Will keep track of his BP and HR.

## 2017-12-26 NOTE — Telephone Encounter (Signed)
Pt called back again to report that his heart rate is consistently staying above 100.Marland Kitchen. He says his BP is staying about 115/80... He says that he feels well but he is very anxious about it and is still only on the metoprolol 25mg  in the morning...he says that he is not sure if Dr. Katrinka BlazingSmith will increase it due to his Myasthenias Gravis but he has not had problems on the 25mg  so far. Advised him that I will forward to Dr. Katrinka BlazingSmith but he is in the cath lab today.. Will wait to hear back and if not will talk with our DOD and will call him back today.

## 2017-12-26 NOTE — Telephone Encounter (Signed)
Thanks for taking care of this.  My plan was to further increase metoprolol but digoxi may work fine.  We will see how his heart rate does.

## 2017-12-30 NOTE — Telephone Encounter (Signed)
Spoke with the patient about his blood pressure and heart rate readings. He has consistently had heart rates above 90. His most current readings 110/91, 93 HR and 106/78, 122 HR. The patient wanted to know if he should increase his metoprolol.  Sending to Dr. Katrinka BlazingSmith for recommendations.

## 2017-12-30 NOTE — Telephone Encounter (Signed)
° °  Patient calling today to inform Dr Katrinka BlazingSmith of HR and BP  Pt c/o BP issue: STAT if pt c/o blurred vision, one-sided weakness or slurred speech  1. What are your last 5 BP readings? 110/91, 106/78 HR 122  2. Are you having any other symptoms (ex. Dizziness, headache, blurred vision, passed out)? NO  3. What is your BP issue? Patient wants to know from Dr Katrinka BlazingSmith what is the next plan in care

## 2017-12-31 MED ORDER — METOPROLOL SUCCINATE ER 50 MG PO TB24: 50.0000 mg | ORAL_TABLET | Freq: Every day | ORAL | 3 refills | Status: DC

## 2017-12-31 NOTE — Telephone Encounter (Signed)
Did he resume digoxin as recommended by Dr. Eden EmmsNishan?

## 2017-12-31 NOTE — Telephone Encounter (Signed)
Spoke with the patient, he accepted taking 50 mg of metoprolol succinate and will report his blood pressure and heart rate to us in 1 week or if the values are abnormal. Systolic less than 100 or HR less that 55.

## 2017-12-31 NOTE — Telephone Encounter (Signed)
Increase metoprolol succinate to 50 mg/day.  Monitor heart rate and blood pressure and report to us.

## 2017-12-31 NOTE — Telephone Encounter (Signed)
Yes he has consistently been taking digoxin.

## 2018-01-14 ENCOUNTER — Telehealth: Payer: Self-pay | Admitting: Interventional Cardiology

## 2018-01-14 NOTE — Telephone Encounter (Signed)
Pt dropped off recent BP and HR readings after restarting Digoxin and increasing Metoprolol to 50mg .  BP's range from 82-128/61-93, HRs 60-125. HR 125 was only once.  HRs mostly in the 70s-80s. Dr. Katrinka BlazingSmith reviewed and said ok to stop Digoxin and continue monitoring vitals.  Spoke with pt and made him aware of recommendations.  Advised pt to monitor vitals 2-6 hrs after medications.  Pt verbalized understanding and was in agreement with this plan.

## 2018-01-22 ENCOUNTER — Telehealth: Payer: Self-pay | Admitting: Interventional Cardiology

## 2018-01-22 ENCOUNTER — Telehealth: Payer: Self-pay | Admitting: Neurology

## 2018-01-22 NOTE — Telephone Encounter (Signed)
New message    Pt c/o medication issue:  1. Name of Medication: metoprolol 50 mg  2. How are you currently taking this medication (dosage and times per day)? Once daily  3. Are you having a reaction (difficulty breathing--STAT)? Double vision and muscle weakness in arms and chest  4. What is your medication issue? Pt states he is having a reaction to the medication and unless he is told otherwise he will stop taking this medication today and restart the digoxin tomorrow. Please call.

## 2018-01-22 NOTE — Telephone Encounter (Signed)
Patient calling stated his myasthenia gravis has started back due to increase dose of Metoprolol. Patient stated he started to have double vision and muscle weakness on Sunday night and it has gotten worse. Patient already took his metoprolol this morning. Informed patient not to take any more until he hears back from our office. Patient stated that Dr. Katrinka BlazingSmith knew this might happen if his metoprolol was increased. Patient would like to know if he should go back to metoprolol 25 mg. Patient stated he was going to call his neurologist as well. Patient stated he could start back on digoxin tomorrow. Will send to Dr. Katrinka BlazingSmith for further advisement.

## 2018-01-22 NOTE — Telephone Encounter (Addendum)
Returned call to patient.  Reports starting Toprol XL 25mg  daily one month ago.  He had a recent dose increase to 50mg  daily.  Since taking the higher dosage, he has been experiencing worsening double vision and arm weakness/fatigue.  Says he has already called Dr. Lutricia HorsfallSmiths's office to report these side effects.  States he was instructed to stop Toprol XL and restart Digoxin.  He told me that Dr. Katrinka BlazingSmith was concerned about the long term effects of Digoxin.  However, states if he stays on Toprol then he would be forced to take Mestinon scheduled rather than as needed and he prefers not to do this.

## 2018-01-22 NOTE — Telephone Encounter (Signed)
Patient states is having double vision and weakness from beta blockers. Please call and discuss.

## 2018-01-24 NOTE — Telephone Encounter (Signed)
Go back to prior dose.

## 2018-01-27 NOTE — Telephone Encounter (Signed)
Called patient back about Dr. Michaelle CopasSmith's recommendations. Patient verbalized understanding and will call if his HR is too high.

## 2018-02-10 ENCOUNTER — Other Ambulatory Visit: Payer: Self-pay | Admitting: Interventional Cardiology

## 2018-02-20 ENCOUNTER — Encounter (INDEPENDENT_AMBULATORY_CARE_PROVIDER_SITE_OTHER): Payer: Medicare Other | Admitting: Ophthalmology

## 2018-02-20 DIAGNOSIS — I10 Essential (primary) hypertension: Secondary | ICD-10-CM | POA: Diagnosis not present

## 2018-02-20 DIAGNOSIS — H353111 Nonexudative age-related macular degeneration, right eye, early dry stage: Secondary | ICD-10-CM | POA: Diagnosis not present

## 2018-02-20 DIAGNOSIS — H43813 Vitreous degeneration, bilateral: Secondary | ICD-10-CM

## 2018-02-20 DIAGNOSIS — H353221 Exudative age-related macular degeneration, left eye, with active choroidal neovascularization: Secondary | ICD-10-CM | POA: Diagnosis not present

## 2018-02-20 DIAGNOSIS — H35033 Hypertensive retinopathy, bilateral: Secondary | ICD-10-CM

## 2018-02-21 ENCOUNTER — Telehealth: Payer: Self-pay | Admitting: Neurology

## 2018-02-21 NOTE — Telephone Encounter (Signed)
Please call and give him a follow up visit with me at next available.

## 2018-02-24 NOTE — Telephone Encounter (Signed)
I called the patient.  He is coming in for a follow up on 02/25/2018.

## 2018-02-25 ENCOUNTER — Ambulatory Visit: Payer: Medicare Other | Admitting: Neurology

## 2018-02-25 ENCOUNTER — Other Ambulatory Visit: Payer: Self-pay

## 2018-02-25 ENCOUNTER — Encounter: Payer: Self-pay | Admitting: Neurology

## 2018-02-25 VITALS — BP 120/83 | HR 89 | Resp 18 | Ht 67.0 in | Wt 215.0 lb

## 2018-02-25 DIAGNOSIS — G7000 Myasthenia gravis without (acute) exacerbation: Secondary | ICD-10-CM | POA: Diagnosis not present

## 2018-02-25 MED ORDER — PYRIDOSTIGMINE BROMIDE 60 MG PO TABS: 60.0000 mg | ORAL_TABLET | Freq: Three times a day (TID) | ORAL | 6 refills | Status: DC

## 2018-02-25 MED ORDER — MYCOPHENOLATE MOFETIL 500 MG PO TABS: 1000.0000 mg | ORAL_TABLET | Freq: Two times a day (BID) | ORAL | 11 refills | Status: DC
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
## 2018-02-28 ENCOUNTER — Other Ambulatory Visit: Payer: Self-pay | Admitting: Interventional Cardiology

## 2018-03-03 ENCOUNTER — Other Ambulatory Visit: Payer: Self-pay

## 2018-03-03 MED ORDER — DIGOXIN 250 MCG PO TABS: 0.2500 mg | ORAL_TABLET | Freq: Every day | ORAL | 2 refills | Status: DC

## 2018-03-08 ENCOUNTER — Other Ambulatory Visit: Payer: Self-pay | Admitting: Interventional Cardiology

## 2018-03-10 ENCOUNTER — Other Ambulatory Visit: Payer: Self-pay | Admitting: Interventional Cardiology

## 2018-03-10 MED ORDER — DIGOXIN 250 MCG PO TABS: 0.2500 mg | ORAL_TABLET | Freq: Every day | ORAL | 2 refills | Status: DC

## 2018-03-10 NOTE — Telephone Encounter (Signed)
Pt's medication was sent to pt's pharmacy as requested. Confirmation received.  °

## 2018-03-10 NOTE — Telephone Encounter (Signed)
 *  STAT* If patient is at the pharmacy, call can be transferred to refill team.   1. Which medications need to be refilled? (please list name of each medication and dose if known) digoxin (LANOXIN) 0.25 MG tablet  2. Which pharmacy/location (including street and city if local pharmacy) is medication to be sent to? [facility] [address]  3. Do they need a 30 day or 90 day supply? 90  Patient states pharmacy has not received prescription. Patient has appt 04/24/18. Patient is almost out of medication

## 2018-03-11 ENCOUNTER — Telehealth: Payer: Self-pay | Admitting: Interventional Cardiology

## 2018-03-11 ENCOUNTER — Telehealth: Payer: Self-pay

## 2018-03-11 NOTE — Telephone Encounter (Signed)
Outpatient Medication Detail    Disp Refills Start End   digoxin (LANOXIN) 0.25 MG tablet 90 tablet 2 03/10/2018    Sig - Route: Take 1 tablet (0.25 mg total) by mouth daily. - Oral   Sent to pharmacy as: digoxin (LANOXIN) 0.25 MG tablet   E-Prescribing Status: Receipt confirmed by pharmacy (03/10/2018 9:01 AM EST)   Pharmacy   [id number] - [place], [place] - [address] AT [facility] [address]

## 2018-03-11 NOTE — Telephone Encounter (Signed)
New Message   Pt c/o medication issue:  1. Name of Medication: Digoxin  2. How are you currently taking this medication (dosage and times per day)?  0.25MG  once a day   3. Are you having a reaction (difficulty breathing--STAT)? No   4. What is your medication issue? Patient needs a prior authorization to get the medication.  Patient states he needs it soon as possible he just have enough meds to last until Thursday 03/13/18.

## 2018-03-11 NOTE — Telephone Encounter (Signed)
I have done a Digoxin PA through covermymeds. Key: [id number]  Received this message: [name] Key: [id number] - PA Case ID: [id number] - Rx #: [id number] Outcome Approved today Request Reference Number: [id number]. DIGOXIN TAB 0.25MG  is approved through 02/12/2019.   I have notified [facility] pharmacy and the pt of this approval.

## 2018-03-11 NOTE — Telephone Encounter (Signed)
**Note De-Identified Analyssa Downs Obfuscation** See other phone note from today (03/11/2018).

## 2018-03-17 ENCOUNTER — Ambulatory Visit (HOSPITAL_COMMUNITY): Payer: Medicare Other | Attending: Cardiology

## 2018-03-17 DIAGNOSIS — I482 Chronic atrial fibrillation, unspecified: Secondary | ICD-10-CM | POA: Diagnosis present

## 2018-03-17 DIAGNOSIS — R931 Abnormal findings on diagnostic imaging of heart and coronary circulation: Secondary | ICD-10-CM | POA: Diagnosis not present

## 2018-04-14 ENCOUNTER — Ambulatory Visit
Admission: RE | Admit: 2018-04-14 | Discharge: 2018-04-14 | Disposition: A | Discharge status: Home / Self Care | Payer: Medicare Other | Source: Ambulatory Visit | Attending: Family Medicine | Admitting: Family Medicine

## 2018-04-14 ENCOUNTER — Other Ambulatory Visit: Payer: Self-pay | Admitting: Family Medicine

## 2018-04-14 DIAGNOSIS — R1031 Right lower quadrant pain: Secondary | ICD-10-CM

## 2018-04-23 NOTE — Progress Notes (Signed)
Cardiology Office Note:    Date:  04/24/2018   ID:  [name], DOB [birth year], MRN [id number]  PCP:  [name], MD  Cardiologist:  Lesleigh Noe, MD   Referring MD: [name], MD   Chief Complaint  Patient presents with  . Atrial Fibrillation    History of Present Illness:    [name] is a 73 y.o. male with a hx of myasthenia gravis, recently admitted with atrial fibrillation and underwent electrical cardioversion to sinus rhythm within the last 72 hours. Calledthis morning because heart rate was elevated again and he is concerned there isrecurrent atrial fibrillation.Recurrent AF being treated with amiodarone loading and eventual repeated electrical cardioversion.He has been seen by EP, Camnitz, and is being managed for rate control given severely dilated left atrium.  Winston has permanent atrial fibrillation.  Rate control has been difficult due to myasthenia gravis and interaction of calcium channel blockers and beta-blockers with therapy and disease process.  Currently on Lanoxin 0.25 mg daily.  Last Lanoxin level was 0.7 ng/mL 3 to 4 months ago.  He currently denies halos around lights and appetite is normal.  He denies significant cardiac limitations although if he walks fast enough that his heart rate gets above 120 he has occasional tightness in the chest.  Slowing down relieves the discomfort properly.  He denies orthopnea, PND, lower extremity swelling, dyspnea on exertion with typical activity.  Past Medical History:  Diagnosis Date  . A-fib (HCC)   . GERD (gastroesophageal reflux disease)   . History of Bell's palsy   . History of pericarditis   . Hypertension   . Mixed hyperlipidemia   . Myasthenia gravis (HCC)    currently in remission (11/2014)     Past Surgical History:  Procedure Laterality Date  . CARDIOVERSION N/A 09/21/2016   Procedure: CARDIOVERSION;  Surgeon: Thurmon Fair, MD;  Location: [facility] ENDOSCOPY;  Service:  Cardiovascular;  Laterality: N/A;  . CARDIOVERSION N/A 10/17/2016   Procedure: CARDIOVERSION;  Surgeon: Laurey Morale, MD;  Location: [facility] ENDOSCOPY;  Service: Cardiovascular;  Laterality: N/A;  . CARDIOVERSION N/A 10/31/2016   Procedure: CARDIOVERSION;  Surgeon: Wendall Stade, MD;  Location: [facility] ENDOSCOPY;  Service: Cardiovascular;  Laterality: N/A;  . LAMINECTOMY  1991  . TEE WITHOUT CARDIOVERSION N/A 09/21/2016   Procedure: TRANSESOPHAGEAL ECHOCARDIOGRAM (TEE);  Surgeon: Thurmon Fair, MD;  Location: [facility] ENDOSCOPY;  Service: Cardiovascular;  Laterality: N/A;  . TEE WITHOUT CARDIOVERSION N/A 10/31/2016   Procedure: TRANSESOPHAGEAL ECHOCARDIOGRAM (TEE);  Surgeon: Wendall Stade, MD;  Location: [facility] ENDOSCOPY;  Service: Cardiovascular;  Laterality: N/A;  . TONSILLECTOMY  1950    Current Medications: Current Meds  Medication Sig  . Alpha-Lipoic Acid 300 MG CAPS Take 300 mg by mouth daily.   Marland Kitchen atorvastatin (LIPITOR) 20 MG tablet TAKE 1 TABLET BY MOUTH DAILY  . Coenzyme Q10 (COQ-10) 100 MG CAPS Take 100 mg by mouth daily.   . digoxin (LANOXIN) 0.25 MG tablet Take 1 tablet (0.25 mg total) by mouth daily.  Marland Kitchen ELIQUIS 5 MG TABS tablet TAKE 1 TABLET BY MOUTH TWICE DAILY  . Multiple Vitamins-Minerals (CENTRUM SILVER PO) Take 1 tablet by mouth daily.  . mycophenolate (CELLCEPT) 500 MG tablet Take 2 tablets (1,000 mg total) by mouth 2 (two) times daily.  . Omega-3 Fatty Acids (OMEGA-3 PO) Take 1,040 mg by mouth daily.  Marland Kitchen pyridostigmine (MESTINON) 60 MG tablet Take 60 mg by mouth as needed.  . ramipril (ALTACE) 5 MG capsule Take  5 mg by mouth daily.  Marland Kitchen Resveratrol 100 MG CAPS Take 100 mg by mouth 2 (two) times daily.   . tamsulosin (FLOMAX) 0.4 MG CAPS capsule Take 0.4 mg by mouth daily.  Marland Kitchen triamterene-hydrochlorothiazide (MAXZIDE-25) 37.5-25 MG tablet Take 1 tablet by mouth daily.  . TURMERIC PO Take 100 mg by mouth 2 (two) times daily.     Allergies:   Demerol [meperidine]; Aminoglycosides; Beta  adrenergic blockers; Calcium channel blockers; Ciprofloxacin; Iodinated diagnostic agents; Penicillamine; Procainamide hcl; Quinine derivatives; Succinylcholine chloride; and Vecuronium bromide [vecuronium]   Social History   Socioeconomic History  . Marital status: Married    Spouse name: Not on file  . Number of children: 1  . Years of education: Grad Sch  . Highest education level: Not on file  Occupational History  . Occupation: Pharmacologist  Social Needs  . Financial resource strain: Not on file  . Food insecurity:    Worry: Not on file    Inability: Not on file  . Transportation needs:    Medical: Not on file    Non-medical: Not on file  Tobacco Use  . Smoking status: Former Smoker    Types: Pipe    Last attempt to quit: 12/10/1966    Years since quitting: 51.4  . Smokeless tobacco: Never Used  Substance and Sexual Activity  . Alcohol use: Yes    Alcohol/week: 1.0 standard drinks    Types: 1 Glasses of wine per week  . Drug use: No  . Sexual activity: Not on file  Lifestyle  . Physical activity:    Days per week: Not on file    Minutes per session: Not on file  . Stress: Not on file  Relationships  . Social connections:    Talks on phone: Not on file    Gets together: Not on file    Attends religious service: Not on file    Active member of club or organization: Not on file    Attends meetings of clubs or organizations: Not on file    Relationship status: Not on file  Other Topics Concern  . Not on file  Social History Narrative   Lives at home with his wife.   Right-handed.   Occasional caffeine use.     Family History: The patient's family history includes Cancer in his brother and mother; Healthy in his daughter; Heart disease in his father.  ROS:   Please see the history of present illness.    Decreased hearing, vision disturbance, irregular heartbeat, chest pressure.  All other systems reviewed and are negative.  EKGs/Labs/Other Studies  Reviewed:    The following studies were reviewed today: 2D Doppler echocardiogram 03/17/2018 IMPRESSIONS    1. The left ventricle has normal systolic function of 60-65%. The cavity size is normal. There is no left ventricular wall thickness. The left ventricular diastology could not be evaluated secondary to atrial fibrillation.  2. There is moderate dilatation of the ascending aorta and mild dilatation of the aortic root.  3. Moderately dilated left atrial size.  4. The right ventricle is mildly enlarged in size. There is normal systolic function. Right ventricular systolic pressure is normal with an estimated pressure of 23.1 mmHg.  5. The mitral valve normal in structure. Regurgitation is mild by color flow Doppler.  6. Tricuspid regurgitation is mild.  7. No atrial level shunt detected by color flow Doppler.  8. The inferior vena cava was dilated in size with >50% respiratory variablity.  EKG:  EKG not repeated today.  Recent Labs: 05/29/2017: TSH 1.150 11/03/2017: ALT 24; BUN 19; Creatinine, Ser 0.70; Hemoglobin 15.0; Platelets 166; Potassium 3.6; Sodium 137  Recent Lipid Panel    Component Value Date/Time   CHOL 144 09/13/2015 1041   CHOL 218 (H) 12/14/2014 0915   TRIG 60 09/13/2015 1041   TRIG 77 12/14/2014 0915   HDL 56 09/13/2015 1041   HDL 52 12/14/2014 0915   CHOLHDL 2.6 09/13/2015 1041   VLDL 12 09/13/2015 1041   LDLCALC 76 09/13/2015 1041   LDLCALC 151 (H) 12/14/2014 0915    Physical Exam:    VS:  BP 104/64   Pulse 73   Ht  (1.702 m)   Wt 209 lb 9.6 oz (95.1 kg)   SpO2 96%   BMI 32.83 kg/m     Wt Readings from Last 3 Encounters:  04/24/18 209 lb 9.6 oz (95.1 kg)  02/25/18 215 lb (97.5 kg)  12/02/17 216 lb 4.3 oz (98.1 kg)     GEN: Bees and appearance compatible with age of 47 years.. No acute distress HEENT: Normal NECK: No JVD. LYMPHATICS: No lymphadenopathy CARDIAC: IIRR.  No murmur, no gallop, no edema VASCULAR: 2+ bilateral radial and  carotid pulses, no bruits RESPIRATORY:  Clear to auscultation without rales, wheezing or rhonchi  ABDOMEN: Soft, non-tender, non-distended, No pulsatile mass, MUSCULOSKELETAL: No deformity  SKIN: Warm and dry NEUROLOGIC:  Alert and oriented x 3 PSYCHIATRIC:  Normal affect   ASSESSMENT:    1. Chronic atrial fibrillation   2. High coronary artery calcium score   3. Mixed hyperlipidemia   4. Essential hypertension   5. Chronic anticoagulation   6. Long-term use of high-risk medication    PLAN:    In order of problems listed above:  1. Adequate rate control on Lanoxin 0.25 mg/day.  Check dig level.  Last medication intake was 5 hours prior to the sampling.  Sampling should be done beyond 6 hours of medication.  No side effects based upon history. 2. Secondary risk prevention discussed 3. LDL target 70 or less.  Most recently 75.  Encouraged the patient to decrease saturated fat in diet.  If her remain at this level I would not increase statin therapy. 4. Target blood pressure 130/80 mmHg. 5. Apixaban therapy without side effects. 6. Lanoxin is being used.  Dig level will be checked today.  Overall, Crixus is doing well.  I will plan to see him in 6 to 9 months.  No restrictions.  Did discuss the chest discomfort at high rates.  He needs to inform us if this becomes more restricting.     Medication Adjustments/Labs and Tests Ordered: Current medicines are reviewed at length with the patient today.  Concerns regarding medicines are outlined above.  Orders Placed This Encounter  Procedures  . Digoxin level   No orders of the defined types were placed in this encounter.   Patient Instructions  Medication Instructions:  Your physician recommends that you continue on your current medications as directed. Please refer to the Current Medication list given to you today.  If you need a refill on your cardiac medications before your next appointment, please call your pharmacy.    Lab work: Digoxin level today  If you have labs (blood work) drawn today and your tests are completely normal, you will receive your results only by: Marland Kitchen MyChart Message (if you have MyChart) OR . A paper copy in the mail If you have any lab test  that is abnormal or we need to change your treatment, we will call you to review the results.  Testing/Procedures: None  Follow-Up: At [facility], you and your health needs are our priority.  As part of our continuing mission to provide you with exceptional heart care, we have created designated Provider Care Teams.  These Care Teams include your primary Cardiologist (physician) and Advanced Practice Providers (APPs -  Physician Assistants and Nurse Practitioners) who all work together to provide you with the care you need, when you need it. You will need a follow up appointment in 6-9 months.  Please call our office 2 months in advance to schedule this appointment.  You may see Lesleigh Noe, MD or one of the following Advanced Practice Providers on your designated Care Team:   Norma Fredrickson, NP Nada Boozer, NP . Georgie Chard, NP  Any Other Special Instructions Will Be Listed Below (If Applicable).       Signed, Lesleigh Noe, MD  04/24/2018 12:23 PM    [facility]

## 2018-04-24 ENCOUNTER — Ambulatory Visit: Payer: Medicare Other | Admitting: Interventional Cardiology

## 2018-04-24 ENCOUNTER — Other Ambulatory Visit: Payer: Self-pay

## 2018-04-24 ENCOUNTER — Encounter: Payer: Self-pay | Admitting: Interventional Cardiology

## 2018-04-24 VITALS — BP 104/64 | HR 73 | Ht 67.0 in | Wt 209.6 lb

## 2018-04-24 DIAGNOSIS — Z79899 Other long term (current) drug therapy: Secondary | ICD-10-CM

## 2018-04-24 DIAGNOSIS — E782 Mixed hyperlipidemia: Secondary | ICD-10-CM | POA: Diagnosis not present

## 2018-04-24 DIAGNOSIS — I10 Essential (primary) hypertension: Secondary | ICD-10-CM

## 2018-04-24 DIAGNOSIS — R931 Abnormal findings on diagnostic imaging of heart and coronary circulation: Secondary | ICD-10-CM

## 2018-04-24 DIAGNOSIS — I482 Chronic atrial fibrillation, unspecified: Secondary | ICD-10-CM

## 2018-04-24 DIAGNOSIS — Z7901 Long term (current) use of anticoagulants: Secondary | ICD-10-CM

## 2018-04-24 NOTE — Patient Instructions (Signed)
Medication Instructions:  Your physician recommends that you continue on your current medications as directed. Please refer to the Current Medication list given to you today.  If you need a refill on your cardiac medications before your next appointment, please call your pharmacy.   Lab work: Digoxin level today  If you have labs (blood work) drawn today and your tests are completely normal, you will receive your results only by: Marland Kitchen MyChart Message (if you have MyChart) OR . A paper copy in the mail If you have any lab test that is abnormal or we need to change your treatment, we will call you to review the results.  Testing/Procedures: None  Follow-Up: At [facility], you and your health needs are our priority.  As part of our continuing mission to provide you with exceptional heart care, we have created designated Provider Care Teams.  These Care Teams include your primary Cardiologist (physician) and Advanced Practice Providers (APPs -  Physician Assistants and Nurse Practitioners) who all work together to provide you with the care you need, when you need it. You will need a follow up appointment in 6-9 months.  Please call our office 2 months in advance to schedule this appointment.  You may see Lesleigh Noe, MD or one of the following Advanced Practice Providers on your designated Care Team:   Norma Fredrickson, NP Nada Boozer, NP . Georgie Chard, NP  Any Other Special Instructions Will Be Listed Below (If Applicable).

## 2018-04-25 LAB — DIGOXIN LEVEL: Digoxin, Serum: 0.8 ng/mL (ref 0.5–0.9)

## 2018-05-15 ENCOUNTER — Other Ambulatory Visit: Payer: Self-pay

## 2018-05-15 ENCOUNTER — Encounter (INDEPENDENT_AMBULATORY_CARE_PROVIDER_SITE_OTHER): Payer: Medicare Other | Admitting: Ophthalmology

## 2018-05-15 DIAGNOSIS — H353111 Nonexudative age-related macular degeneration, right eye, early dry stage: Secondary | ICD-10-CM

## 2018-05-15 DIAGNOSIS — H353221 Exudative age-related macular degeneration, left eye, with active choroidal neovascularization: Secondary | ICD-10-CM

## 2018-05-15 DIAGNOSIS — H43813 Vitreous degeneration, bilateral: Secondary | ICD-10-CM

## 2018-05-15 DIAGNOSIS — I10 Essential (primary) hypertension: Secondary | ICD-10-CM

## 2018-05-15 DIAGNOSIS — H35033 Hypertensive retinopathy, bilateral: Secondary | ICD-10-CM | POA: Diagnosis not present

## 2018-05-15 DIAGNOSIS — H2513 Age-related nuclear cataract, bilateral: Secondary | ICD-10-CM

## 2018-05-27 ENCOUNTER — Other Ambulatory Visit: Payer: Self-pay

## 2018-05-27 ENCOUNTER — Ambulatory Visit (INDEPENDENT_AMBULATORY_CARE_PROVIDER_SITE_OTHER): Payer: Medicare Other | Admitting: Neurology

## 2018-05-27 ENCOUNTER — Encounter: Payer: Self-pay | Admitting: Neurology

## 2018-05-27 DIAGNOSIS — G7000 Myasthenia gravis without (acute) exacerbation: Secondary | ICD-10-CM

## 2018-05-27 MED ORDER — PYRIDOSTIGMINE BROMIDE 60 MG PO TABS: 60.0000 mg | ORAL_TABLET | Freq: Two times a day (BID) | ORAL | 6 refills | Status: DC | PRN

## 2018-05-27 NOTE — Progress Notes (Signed)
PATIENT: [name] DOB: 06-07-1945  Virtual Visit via Video  I connected with [name] on 05/27/18 at  by Video and verified that I am speaking with the correct person using two identifiers.   I discussed the limitations, risks, security and privacy concerns of performing an evaluation and management service by Video and the availability of in person appointments. I also discussed with the patient that there may be a patient responsible charge related to this service. The patient expressed understanding and agreed to proceed.  HISTORICAL  [name], seen in request by  I have reviewed and summarized the referring note from the referring physician.   UPDATE Jan 14th 2020: He continue have double vision, especially since his metoprolol dose was jumped from 25 to 50 mg every day in December 2019, despite stopping it shortly afterwards, he now complains of intermittent double vision, getting worse when tired, difficulty reading, generalized fatigue, he can only walk his dog 0.5 miles instead of previous 1.5 miles each day, deep achy muscle fatigue tightness sensation with exertion, Mestinon 60 mg up to 3 times a day was helpful  UPDATE May 27 2018: 1 months after he is on higher dose of CellCept, his symptoms has much improved, he no longer had generalized fatigue, is able to walk 5 miles each day, only require Mestinon once as needed,  He has no trouble walking, no trouble getting up from sitting down position  Observations/Objective: I have reviewed problem lists, medications, allergies. Awake alert oriented to history taking and care of conversation, no dysarthria, no gait abnormality  Assessment and Plan: [name] is a 73 y.o. male   Seropositive generalized myasthenia gravis  Exacerbation since December 2019, double vision, mild proximal upper extremity weakness, fatigue,  Much improved with higher dose of Cellcept to 500mg  to 2 tablets  twice a day since January 2020, bounced back to baseline 4 weeks after higher dose treatment  Will decrease CellCept to 500 mg twice a day  Mestinon as needed       Follow Up Instructions:   6 months   I discussed the assessment and treatment plan with the patient. The patient was provided an opportunity to ask questions and all were answered. The patient agreed with the plan and demonstrated an understanding of the instructions.   The patient was advised to call back or seek an in-person evaluation if the symptoms worsen or if the condition fails to improve as anticipated.  I provided 30 minutes minutes of non-face-to-face time during this encounter.  REVIEW OF SYSTEMS: Full 14 system review of systems performed and notable only for as above All other review of systems were negative.  ALLERGIES: Allergies  Allergen Reactions  . Demerol [Meperidine]     hallucinations  . Aminoglycosides     Can not use due to mysthenia gravis  . Beta Adrenergic Blockers     Can not use due to mysthenia gravis  . Calcium Channel Blockers     Can not use due to mysthenia gravis  . Ciprofloxacin     Can not use due to mysthenia gravis  . Iodinated Diagnostic Agents     Can not use due to mysthenia gravis  . Penicillamine     Can not use due to mysthenia gravis  . Procainamide Hcl     Can not use due to mysthenia gravis  . Quinine Derivatives     Can not use due to mysthenia gravis  .  Succinylcholine Chloride     Can not use due to mysthenia gravis  . Vecuronium Bromide [Vecuronium]     Can not use due to mysthenia gravis    HOME MEDICATIONS: Current Outpatient Medications  Medication Sig Dispense Refill  . Alpha-Lipoic Acid 300 MG CAPS Take 300 mg by mouth daily.     Marland Kitchen atorvastatin (LIPITOR) 20 MG tablet TAKE 1 TABLET BY MOUTH DAILY 30 tablet 11  . Coenzyme Q10 (COQ-10) 100 MG CAPS Take 100 mg by mouth daily.     . digoxin (LANOXIN) 0.25 MG tablet Take 1 tablet (0.25 mg total) by  mouth daily. 90 tablet 2  . ELIQUIS 5 MG TABS tablet TAKE 1 TABLET BY MOUTH TWICE DAILY 60 tablet 5  . Multiple Vitamins-Minerals (CENTRUM SILVER PO) Take 1 tablet by mouth daily.    . mycophenolate (CELLCEPT) 500 MG tablet Take 2 tablets (1,000 mg total) by mouth 2 (two) times daily. 120 tablet 11  . Omega-3 Fatty Acids (OMEGA-3 PO) Take 1,040 mg by mouth daily.    Marland Kitchen pyridostigmine (MESTINON) 60 MG tablet Take 60 mg by mouth as needed.    . ramipril (ALTACE) 5 MG capsule Take 5 mg by mouth daily.    Marland Kitchen Resveratrol 100 MG CAPS Take 100 mg by mouth 2 (two) times daily.     . tamsulosin (FLOMAX) 0.4 MG CAPS capsule Take 0.4 mg by mouth daily.    Marland Kitchen triamterene-hydrochlorothiazide (MAXZIDE-25) 37.5-25 MG tablet Take 1 tablet by mouth daily.    . TURMERIC PO Take 100 mg by mouth 2 (two) times daily.     No current facility-administered medications for this visit.     PAST MEDICAL HISTORY: Past Medical History:  Diagnosis Date  . A-fib (HCC)   . GERD (gastroesophageal reflux disease)   . History of Bell's palsy   . History of pericarditis   . Hypertension   . Mixed hyperlipidemia   . Myasthenia gravis (HCC)    currently in remission (11/2014)     PAST SURGICAL HISTORY: Past Surgical History:  Procedure Laterality Date  . CARDIOVERSION N/A 09/21/2016   Procedure: CARDIOVERSION;  Surgeon: Thurmon Fair, MD;  Location: [facility] ENDOSCOPY;  Service: Cardiovascular;  Laterality: N/A;  . CARDIOVERSION N/A 10/17/2016   Procedure: CARDIOVERSION;  Surgeon: Laurey Morale, MD;  Location: [facility] ENDOSCOPY;  Service: Cardiovascular;  Laterality: N/A;  . CARDIOVERSION N/A 10/31/2016   Procedure: CARDIOVERSION;  Surgeon: Wendall Stade, MD;  Location: [facility] ENDOSCOPY;  Service: Cardiovascular;  Laterality: N/A;  . LAMINECTOMY  1991  . TEE WITHOUT CARDIOVERSION N/A 09/21/2016   Procedure: TRANSESOPHAGEAL ECHOCARDIOGRAM (TEE);  Surgeon: Thurmon Fair, MD;  Location: [facility] ENDOSCOPY;  Service: Cardiovascular;   Laterality: N/A;  . TEE WITHOUT CARDIOVERSION N/A 10/31/2016   Procedure: TRANSESOPHAGEAL ECHOCARDIOGRAM (TEE);  Surgeon: Wendall Stade, MD;  Location: [facility] ENDOSCOPY;  Service: Cardiovascular;  Laterality: N/A;  . TONSILLECTOMY  1950    FAMILY HISTORY: Family History  Problem Relation Age of Onset  . Cancer Mother   . Heart disease Father   . Cancer Brother        bladder/ in remission  . Healthy Daughter        age 11    SOCIAL HISTORY:   Social History   Socioeconomic History  . Marital status: Married    Spouse name: Not on file  . Number of children: 1  . Years of education: Grad Sch  . Highest education level: Not on file  Occupational History  .  Occupation: Pharmacologist  Social Needs  . Financial resource strain: Not on file  . Food insecurity:    Worry: Not on file    Inability: Not on file  . Transportation needs:    Medical: Not on file    Non-medical: Not on file  Tobacco Use  . Smoking status: Former Smoker    Types: Pipe    Last attempt to quit: 12/10/1966    Years since quitting: 51.4  . Smokeless tobacco: Never Used  Substance and Sexual Activity  . Alcohol use: Yes    Alcohol/week: 1.0 standard drinks    Types: 1 Glasses of wine per week  . Drug use: No  . Sexual activity: Not on file  Lifestyle  . Physical activity:    Days per week: Not on file    Minutes per session: Not on file  . Stress: Not on file  Relationships  . Social connections:    Talks on phone: Not on file    Gets together: Not on file    Attends religious service: Not on file    Active member of club or organization: Not on file    Attends meetings of clubs or organizations: Not on file    Relationship status: Not on file  . Intimate partner violence:    Fear of current or ex partner: Not on file    Emotionally abused: Not on file    Physically abused: Not on file    Forced sexual activity: Not on file  Other Topics Concern  . Not on file  Social History  Narrative   Lives at home with his wife.   Right-handed.   Occasional caffeine use.    Levert Feinstein, M.D. Ph.D.  [facility] [address], [address] [place], [place] [address] Ph: [phone] Fax: [phone]

## 2018-06-03 ENCOUNTER — Other Ambulatory Visit: Payer: Self-pay | Admitting: Gastroenterology

## 2018-06-03 DIAGNOSIS — R1031 Right lower quadrant pain: Secondary | ICD-10-CM

## 2018-06-04 ENCOUNTER — Ambulatory Visit
Admission: RE | Admit: 2018-06-04 | Discharge: 2018-06-04 | Disposition: A | Discharge status: Home / Self Care | Payer: Medicare Other | Source: Ambulatory Visit | Attending: Gastroenterology | Admitting: Gastroenterology

## 2018-06-04 ENCOUNTER — Other Ambulatory Visit: Payer: Self-pay

## 2018-06-04 DIAGNOSIS — R1031 Right lower quadrant pain: Secondary | ICD-10-CM

## 2018-07-21 ENCOUNTER — Ambulatory Visit: Payer: Medicare Other | Admitting: Adult Health

## 2018-07-21 ENCOUNTER — Ambulatory Visit: Payer: Medicare Other | Admitting: Neurology

## 2018-08-04 ENCOUNTER — Other Ambulatory Visit: Payer: Self-pay | Admitting: *Deleted

## 2018-08-04 MED ORDER — APIXABAN 5 MG PO TABS: 5.0000 mg | ORAL_TABLET | Freq: Two times a day (BID) | ORAL | 5 refills | Status: DC

## 2018-08-04 NOTE — Telephone Encounter (Signed)
Pt last saw Dr Tamala Julian 04/24/18, last labs 11/03/17 Creat 0.70, age 73, weight 95.1kg, based on specified criteria pt is on appropriate dosage of Eliquis 5mg  BID.  Will refill rx.

## 2018-08-07 ENCOUNTER — Other Ambulatory Visit: Payer: Self-pay

## 2018-08-07 ENCOUNTER — Encounter (INDEPENDENT_AMBULATORY_CARE_PROVIDER_SITE_OTHER): Payer: Medicare Other | Admitting: Ophthalmology

## 2018-08-07 DIAGNOSIS — H353111 Nonexudative age-related macular degeneration, right eye, early dry stage: Secondary | ICD-10-CM

## 2018-08-07 DIAGNOSIS — I10 Essential (primary) hypertension: Secondary | ICD-10-CM | POA: Diagnosis not present

## 2018-08-07 DIAGNOSIS — H353221 Exudative age-related macular degeneration, left eye, with active choroidal neovascularization: Secondary | ICD-10-CM

## 2018-08-07 DIAGNOSIS — H35033 Hypertensive retinopathy, bilateral: Secondary | ICD-10-CM

## 2018-08-07 DIAGNOSIS — H43813 Vitreous degeneration, bilateral: Secondary | ICD-10-CM

## 2018-10-30 ENCOUNTER — Other Ambulatory Visit: Payer: Self-pay

## 2018-10-30 ENCOUNTER — Encounter (INDEPENDENT_AMBULATORY_CARE_PROVIDER_SITE_OTHER): Payer: Medicare Other | Admitting: Ophthalmology

## 2018-10-30 DIAGNOSIS — I10 Essential (primary) hypertension: Secondary | ICD-10-CM | POA: Diagnosis not present

## 2018-10-30 DIAGNOSIS — H43813 Vitreous degeneration, bilateral: Secondary | ICD-10-CM

## 2018-10-30 DIAGNOSIS — H35033 Hypertensive retinopathy, bilateral: Secondary | ICD-10-CM | POA: Diagnosis not present

## 2018-10-30 DIAGNOSIS — H353221 Exudative age-related macular degeneration, left eye, with active choroidal neovascularization: Secondary | ICD-10-CM

## 2018-11-27 ENCOUNTER — Encounter: Payer: Self-pay | Admitting: Neurology

## 2018-11-27 ENCOUNTER — Other Ambulatory Visit: Payer: Self-pay

## 2018-11-27 ENCOUNTER — Ambulatory Visit: Payer: Medicare Other | Admitting: Neurology

## 2018-11-27 VITALS — BP 131/83 | HR 85 | Temp 97.8°F | Ht 67.0 in | Wt 184.5 lb

## 2018-11-27 DIAGNOSIS — G7000 Myasthenia gravis without (acute) exacerbation: Secondary | ICD-10-CM

## 2018-11-27 MED ORDER — MYCOPHENOLATE MOFETIL 500 MG PO TABS: 250.0000 mg | ORAL_TABLET | Freq: Two times a day (BID) | ORAL | 11 refills | Status: DC
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
## 2018-12-01 ENCOUNTER — Other Ambulatory Visit: Payer: Self-pay | Admitting: Interventional Cardiology

## 2018-12-01 MED ORDER — DIGOXIN 250 MCG PO TABS: 0.2500 mg | ORAL_TABLET | Freq: Every day | ORAL | 1 refills | Status: DC

## 2018-12-08 ENCOUNTER — Ambulatory Visit: Payer: Medicare Other | Admitting: Interventional Cardiology

## 2018-12-30 NOTE — Progress Notes (Signed)
Cardiology Office Note:    Date:  12/31/2018   ID:  [name], DOB December 25, 1945, MRN [id number]  PCP:  [name], MD  Cardiologist:  Lesleigh Noe, MD   Referring MD: [name], MD   Chief Complaint  Patient presents with  . Atrial Fibrillation  . Chest Pain    History of Present Illness:    [name] is a 73 y.o. male with a hx of  Permanent AF, chronic anticoagulation, essential hypertension, hyperlipidemia, and Myasthenia gravis.  Nonobstructive CAD by cath 2002 (LAD luminal irregularities).  He has experienced spontaneous episodes of chest tightness that are low-level intense, perhaps 3/10.  These episodes last 5 to 10 minutes.  There is no radiated discomfort or other symptoms.  He denies diaphoresis.  There is no accompanying palpitation.  He continues to be active with walking and is never had the discomfort to occur with physical activity.  Past Medical History:  Diagnosis Date  . A-fib (HCC)   . GERD (gastroesophageal reflux disease)   . History of Bell's palsy   . History of pericarditis   . Hypertension   . Mixed hyperlipidemia   . Myasthenia gravis (HCC)    currently in remission (11/2014)     Past Surgical History:  Procedure Laterality Date  . CARDIOVERSION N/A 09/21/2016   Procedure: CARDIOVERSION;  Surgeon: Thurmon Fair, MD;  Location: [facility] ENDOSCOPY;  Service: Cardiovascular;  Laterality: N/A;  . CARDIOVERSION N/A 10/17/2016   Procedure: CARDIOVERSION;  Surgeon: Laurey Morale, MD;  Location: [facility] ENDOSCOPY;  Service: Cardiovascular;  Laterality: N/A;  . CARDIOVERSION N/A 10/31/2016   Procedure: CARDIOVERSION;  Surgeon: Wendall Stade, MD;  Location: [facility] ENDOSCOPY;  Service: Cardiovascular;  Laterality: N/A;  . LAMINECTOMY  1991  . TEE WITHOUT CARDIOVERSION N/A 09/21/2016   Procedure: TRANSESOPHAGEAL ECHOCARDIOGRAM (TEE);  Surgeon: Thurmon Fair, MD;  Location: [facility] ENDOSCOPY;  Service: Cardiovascular;  Laterality: N/A;  .  TEE WITHOUT CARDIOVERSION N/A 10/31/2016   Procedure: TRANSESOPHAGEAL ECHOCARDIOGRAM (TEE);  Surgeon: Wendall Stade, MD;  Location: [facility] ENDOSCOPY;  Service: Cardiovascular;  Laterality: N/A;  . TONSILLECTOMY  1950    Current Medications: Current Meds  Medication Sig  . Alpha-Lipoic Acid 300 MG CAPS Take 300 mg by mouth daily.   Marland Kitchen apixaban (ELIQUIS) 5 MG TABS tablet Take 1 tablet (5 mg total) by mouth 2 (two) times daily.  Marland Kitchen atorvastatin (LIPITOR) 20 MG tablet TAKE 1 TABLET BY MOUTH DAILY  . Coenzyme Q10 (COQ-10) 100 MG CAPS Take 100 mg by mouth daily.   . digoxin (LANOXIN) 0.25 MG tablet Take 1 tablet (0.25 mg total) by mouth daily.  . Multiple Vitamins-Minerals (CENTRUM SILVER PO) Take 1 tablet by mouth daily.  . mycophenolate (CELLCEPT) 500 MG tablet Take 0.5 tablets (250 mg total) by mouth 2 (two) times daily.  . Omega-3 Fatty Acids (OMEGA-3 PO) Take 1,040 mg by mouth daily.  Marland Kitchen pyridostigmine (MESTINON) 60 MG tablet Take 1 tablet (60 mg total) by mouth 2 (two) times daily as needed.  . ramipril (ALTACE) 5 MG capsule Take 5 mg by mouth daily.  Marland Kitchen Resveratrol 100 MG CAPS Take 100 mg by mouth 2 (two) times daily.   . tamsulosin (FLOMAX) 0.4 MG CAPS capsule Take 0.4 mg by mouth daily.  Marland Kitchen triamterene-hydrochlorothiazide (MAXZIDE-25) 37.5-25 MG tablet Take 1 tablet by mouth daily.   . TURMERIC PO Take 100 mg by mouth 2 (two) times daily.     Allergies:   Demerol [meperidine], Aminoglycosides,  Beta adrenergic blockers, Calcium channel blockers, Ciprofloxacin, Iodinated diagnostic agents, Penicillamine, Procainamide hcl, Quinine derivatives, Succinylcholine chloride, and Vecuronium bromide [vecuronium]   Social History   Socioeconomic History  . Marital status: Married    Spouse name: Not on file  . Number of children: 1  . Years of education: Grad Sch  . Highest education level: Not on file  Occupational History  . Occupation: Chartered certified accountant  Social Needs  . Financial resource  strain: Not on file  . Food insecurity    Worry: Not on file    Inability: Not on file  . Transportation needs    Medical: Not on file    Non-medical: Not on file  Tobacco Use  . Smoking status: Former Smoker    Types: Pipe    Quit date: 12/10/1966    Years since quitting: 52.0  . Smokeless tobacco: Never Used  Substance and Sexual Activity  . Alcohol use: Yes    Alcohol/week: 1.0 standard drinks    Types: 1 Glasses of wine per week  . Drug use: No  . Sexual activity: Not on file  Lifestyle  . Physical activity    Days per week: Not on file    Minutes per session: Not on file  . Stress: Not on file  Relationships  . Social Herbalist on phone: Not on file    Gets together: Not on file    Attends religious service: Not on file    Active member of club or organization: Not on file    Attends meetings of clubs or organizations: Not on file    Relationship status: Not on file  Other Topics Concern  . Not on file  Social History Narrative   Lives at home with his wife.   Right-handed.   Occasional caffeine use.     Family History: The patient's family history includes Cancer in his brother and mother; Healthy in his daughter; Heart disease in his father.  ROS:   Please see the history of present illness.    Appetite is been stable.  He has a Armed forces training and education officer which allows him to monitor his heart rhythm.  All other systems reviewed and are negative.  EKGs/Labs/Other Studies Reviewed:    The following studies were reviewed today: Kardia monitoring data:  He occasionally has entries that suggest heart rate is above 140.  He has never captured an event with the EKG device.  He does not have palpitations.  These notifications may represent noise.  The [facility] for the month of October was reviewed.  Continuous atrial for ablation with no episodes of rapid heart rate above 110 bpm.  2D Doppler echocardiogram 03/17/2018: IMPRESSIONS    1. The left ventricle  has normal systolic function of 16-10%. The cavity size is normal. There is no left ventricular wall thickness. The left ventricular diastology could not be evaluated secondary to atrial fibrillation.  2. There is moderate dilatation of the ascending aorta and mild dilatation of the aortic root.  3. Moderately dilated left atrial size.  4. The right ventricle is mildly enlarged in size. There is normal systolic function. Right ventricular systolic pressure is normal with an estimated pressure of 23.1 mmHg.  5. The mitral valve normal in structure. Regurgitation is mild by color flow Doppler.  6. Tricuspid regurgitation is mild.  7. No atrial level shunt detected by color flow Doppler.  8. The inferior vena cava was dilated in size with >50% respiratory variablity.  EKG:  EKG performed today demonstrates atrial for ablation with controlled rate at 81 bpm, nonspecific ST-T wave abnormality.  Recent Labs: No results found for requested labs within last 8760 hours.  Recent Lipid Panel    Component Value Date/Time   CHOL 144 09/13/2015 1041   CHOL 218 (H) 12/14/2014 0915   TRIG 60 09/13/2015 1041   TRIG 77 12/14/2014 0915   HDL 56 09/13/2015 1041   HDL 52 12/14/2014 0915   CHOLHDL 2.6 09/13/2015 1041   VLDL 12 09/13/2015 1041   LDLCALC 76 09/13/2015 1041   LDLCALC 151 (H) 12/14/2014 0915    Physical Exam:    VS:  BP 106/64   Pulse 81   Ht  (1.702 m)   Wt 183 lb 6.4 oz (83.2 kg)   SpO2 97%   BMI 28.72 kg/m     Wt Readings from Last 3 Encounters:  12/31/18 183 lb 6.4 oz (83.2 kg)  11/27/18 184 lb 8 oz (83.7 kg)  04/24/18 209 lb 9.6 oz (95.1 kg)     GEN: Compatible with age.. No acute distress HEENT: Normal NECK: No JVD. LYMPHATICS: No lymphadenopathy CARDIAC: Irregularly irregular RR without murmur, gallop, or edema. VASCULAR:  Normal Pulses. No bruits. RESPIRATORY:  Clear to auscultation without rales, wheezing or rhonchi  ABDOMEN: Soft, non-tender, non-distended, No  pulsatile mass, MUSCULOSKELETAL: No deformity  SKIN: Warm and dry NEUROLOGIC:  Alert and oriented x 3 PSYCHIATRIC:  Normal affect   ASSESSMENT:    1. Chronic atrial fibrillation (HCC)   2. Other chest pain   3. Mixed hyperlipidemia   4. Essential hypertension   5. Chronic anticoagulation   6. Educated about COVID-19 virus infection    PLAN:    In order of problems listed above:  1. Stable. 2. A Lexiscan myocardial perfusion study will be done to assess chest pain in the setting of known nonobstructive coronary disease. 3. Not discussed 4. Not discussed 5. Continue therapy 6. The 3W's are being applied in daily life.   Medication Adjustments/Labs and Tests Ordered: Current medicines are reviewed at length with the patient today.  Concerns regarding medicines are outlined above.  Orders Placed This Encounter  Procedures  . MYOCARDIAL PERFUSION IMAGING  . EKG 12-Lead   No orders of the defined types were placed in this encounter.   Patient Instructions  Medication Instructions:  Your physician recommends that you continue on your current medications as directed. Please refer to the Current Medication list given to you today.  *If you need a refill on your cardiac medications before your next appointment, please call your pharmacy*  Lab Work: None If you have labs (blood work) drawn today and your tests are completely normal, you will receive your results only by: Marland Kitchen MyChart Message (if you have MyChart) OR . A paper copy in the mail If you have any lab test that is abnormal or we need to change your treatment, we will call you to review the results.  Testing/Procedures: Your physician has requested that you have a lexiscan myoview. For further information please visit [email or web address] Please follow instruction sheet, as given.   Follow-Up: At [facility], you and your health needs are our priority.  As part of our continuing mission to provide you with  exceptional heart care, we have created designated Provider Care Teams.  These Care Teams include your primary Cardiologist (physician) and Advanced Practice Providers (APPs -  Physician Assistants and Nurse Practitioners) who all work together to provide you  with the care you need, when you need it.  Your next appointment:   6-9 month(s)  The format for your next appointment:   In Person  Provider:   You may see Lesleigh NoeHenry W Smith III, MD or one of the following Advanced Practice Providers on your designated Care Team:    Norma FredricksonLori Gerhardt, NP  Nada BoozerLaura Ingold, NP  Georgie ChardJill McDaniel, NP   Other Instructions      Signed, Lesleigh NoeHenry W Smith III, MD  12/31/2018 1:18 PM    [facility]

## 2018-12-31 ENCOUNTER — Encounter: Payer: Self-pay | Admitting: *Deleted

## 2018-12-31 ENCOUNTER — Ambulatory Visit: Payer: Medicare Other | Admitting: Interventional Cardiology

## 2018-12-31 ENCOUNTER — Encounter: Payer: Self-pay | Admitting: Interventional Cardiology

## 2018-12-31 ENCOUNTER — Other Ambulatory Visit: Payer: Self-pay

## 2018-12-31 VITALS — BP 106/64 | HR 81 | Ht 67.0 in | Wt 183.4 lb

## 2018-12-31 DIAGNOSIS — R0789 Other chest pain: Secondary | ICD-10-CM | POA: Diagnosis not present

## 2018-12-31 DIAGNOSIS — E782 Mixed hyperlipidemia: Secondary | ICD-10-CM

## 2018-12-31 DIAGNOSIS — I10 Essential (primary) hypertension: Secondary | ICD-10-CM

## 2018-12-31 DIAGNOSIS — I482 Chronic atrial fibrillation, unspecified: Secondary | ICD-10-CM

## 2018-12-31 DIAGNOSIS — Z7901 Long term (current) use of anticoagulants: Secondary | ICD-10-CM

## 2018-12-31 DIAGNOSIS — Z7189 Other specified counseling: Secondary | ICD-10-CM

## 2018-12-31 NOTE — Patient Instructions (Signed)
Medication Instructions:  Your physician recommends that you continue on your current medications as directed. Please refer to the Current Medication list given to you today.  *If you need a refill on your cardiac medications before your next appointment, please call your pharmacy*  Lab Work: None If you have labs (blood work) drawn today and your tests are completely normal, you will receive your results only by: Marland Kitchen MyChart Message (if you have MyChart) OR . A paper copy in the mail If you have any lab test that is abnormal or we need to change your treatment, we will call you to review the results.  Testing/Procedures: Your physician has requested that you have a lexiscan myoview. For further information please visit [email or web address]. Please follow instruction sheet, as given.   Follow-Up: At [facility], you and your health needs are our priority.  As part of our continuing mission to provide you with exceptional heart care, we have created designated Provider Care Teams.  These Care Teams include your primary Cardiologist (physician) and Advanced Practice Providers (APPs -  Physician Assistants and Nurse Practitioners) who all work together to provide you with the care you need, when you need it.  Your next appointment:   6-9 month(s)  The format for your next appointment:   In Person  Provider:   You may see Sinclair Grooms, MD or one of the following Advanced Practice Providers on your designated Care Team:    Truitt Merle, NP  Cecilie Kicks, NP  Kathyrn Drown, NP   Other Instructions

## 2019-01-01 ENCOUNTER — Telehealth (HOSPITAL_COMMUNITY): Payer: Self-pay

## 2019-01-01 NOTE — Telephone Encounter (Signed)
Spoke with the patient, instructions given. He stated that he would be here for his test. Asked to call back with any questions. S.[name] EMTP 

## 2019-01-06 ENCOUNTER — Other Ambulatory Visit: Payer: Self-pay

## 2019-01-06 ENCOUNTER — Ambulatory Visit (HOSPITAL_COMMUNITY): Payer: Medicare Other | Attending: Cardiology

## 2019-01-06 DIAGNOSIS — R0789 Other chest pain: Secondary | ICD-10-CM | POA: Diagnosis not present

## 2019-01-06 LAB — MYOCARDIAL PERFUSION IMAGING
LV dias vol: 96 mL (ref 62–150)
LV sys vol: 43 mL
Peak HR: 103 {beats}/min
Rest HR: 70 {beats}/min
SDS: 0
SRS: 0
SSS: 0
TID: 0.97

## 2019-01-06 MED ORDER — TECHNETIUM TC 99M TETROFOSMIN IV KIT
31.1000 | PACK | Freq: Once | INTRAVENOUS | Status: AC | PRN
  Administered 2019-01-06: 31.1 via INTRAVENOUS
  Filled 2019-01-06: qty 32

## 2019-01-06 MED ORDER — REGADENOSON 0.4 MG/5ML IV SOLN
0.4000 mg | Freq: Once | INTRAVENOUS | Status: AC
  Administered 2019-01-06: 0.4 mg via INTRAVENOUS

## 2019-01-06 MED ORDER — TECHNETIUM TC 99M TETROFOSMIN IV KIT
10.5000 | PACK | Freq: Once | INTRAVENOUS | Status: AC | PRN
  Administered 2019-01-06: 10.5 via INTRAVENOUS
  Filled 2019-01-06: qty 11

## 2019-01-29 ENCOUNTER — Other Ambulatory Visit: Payer: Self-pay

## 2019-01-29 ENCOUNTER — Encounter (INDEPENDENT_AMBULATORY_CARE_PROVIDER_SITE_OTHER): Payer: Medicare Other | Admitting: Ophthalmology

## 2019-01-29 DIAGNOSIS — H35033 Hypertensive retinopathy, bilateral: Secondary | ICD-10-CM

## 2019-01-29 DIAGNOSIS — H353221 Exudative age-related macular degeneration, left eye, with active choroidal neovascularization: Secondary | ICD-10-CM

## 2019-01-29 DIAGNOSIS — H43813 Vitreous degeneration, bilateral: Secondary | ICD-10-CM

## 2019-01-29 DIAGNOSIS — I10 Essential (primary) hypertension: Secondary | ICD-10-CM | POA: Diagnosis not present

## 2019-01-29 MED ORDER — APIXABAN 5 MG PO TABS: 5.0000 mg | ORAL_TABLET | Freq: Two times a day (BID) | ORAL | 5 refills | Status: DC

## 2019-01-29 NOTE — Telephone Encounter (Signed)
Eliquis 5mg  refill request received, pt is 73 yrs old, weight-83kg, Crea-0.79 on 04/10/2018 via [facility] from [place] PCP, Diagnosis-Afib, and last seen by Dr. Tamala Julian on 12/31/2018. Dose is appropriate based on dosing criteria. Will send in refill to requested pharmacy.

## 2019-02-02 ENCOUNTER — Other Ambulatory Visit: Payer: Self-pay | Admitting: *Deleted

## 2019-02-02 ENCOUNTER — Telehealth: Payer: Self-pay | Admitting: *Deleted

## 2019-02-02 MED ORDER — MYCOPHENOLATE MOFETIL 250 MG PO CAPS: 250.0000 mg | ORAL_CAPSULE | Freq: Every day | ORAL | 5 refills | Status: DC

## 2019-02-02 NOTE — Telephone Encounter (Addendum)
He has been taking CellCept 500mg , 0.5 tablet twice daily since 11/27/2018.  He feels he is ready to further decrease his dosage to 250mg , one tablet daily.  He would like to get a new prescription for CellCept 250mg , one tablet daily sent to the pharmacy.

## 2019-02-02 NOTE — Telephone Encounter (Signed)
Email from patient:  [name], [name] Clinical Pool  Phone Number: [phone]  Dr. Krista Blue,   I have cut all of the 500 mg mycophenolate tablets in half. I have enough doses to make it through Thursday. I will need the new prescription for the 250 mg dosage.   It has been 2 months since you lowered the dosage. I am ready to reduce the dosage to 250 mg one time a day.   Thank you,  [name]   Thank you,

## 2019-02-02 NOTE — Telephone Encounter (Signed)
Per vo by Dr. Rexene Alberts, she will authorize the prescription for generic CellCept 250mg , one tablet daily.  The patient is aware it will be sent to the pharmacy.

## 2019-02-02 NOTE — Addendum Note (Signed)
Addended by: Desmond Lope on: 02/02/2019 04:23 PM   Modules accepted: Orders

## 2019-02-02 NOTE — Addendum Note (Signed)
Addended by: Noberto Retort C on: 02/02/2019 04:25 PM   Modules accepted: Orders

## 2019-04-16 ENCOUNTER — Ambulatory Visit: Payer: Medicare PPO | Attending: Internal Medicine

## 2019-04-16 ENCOUNTER — Ambulatory Visit: Payer: Medicare Other

## 2019-04-16 DIAGNOSIS — Z23 Encounter for immunization: Secondary | ICD-10-CM

## 2019-04-16 NOTE — Progress Notes (Signed)
   Covid-19 Vaccination Clinic  Name:  [name]    MRN: [id number] DOB: Nov 30, 1945  04/16/2019  [name] was observed post Covid-19 immunization for 30 minutes based on pre-vaccination screening without incident. He was provided with Vaccine Information Sheet and instruction to access the V-Safe system.   [name] was instructed to call 911 with any severe reactions post vaccine: Marland Kitchen Difficulty breathing  . Swelling of face and throat  . A fast heartbeat  . A bad rash all over body  . Dizziness and weakness

## 2019-04-30 ENCOUNTER — Encounter (INDEPENDENT_AMBULATORY_CARE_PROVIDER_SITE_OTHER): Payer: Medicare Other | Admitting: Ophthalmology

## 2019-04-30 ENCOUNTER — Other Ambulatory Visit: Payer: Self-pay

## 2019-04-30 DIAGNOSIS — H353221 Exudative age-related macular degeneration, left eye, with active choroidal neovascularization: Secondary | ICD-10-CM

## 2019-04-30 DIAGNOSIS — H35033 Hypertensive retinopathy, bilateral: Secondary | ICD-10-CM

## 2019-04-30 DIAGNOSIS — I10 Essential (primary) hypertension: Secondary | ICD-10-CM

## 2019-04-30 DIAGNOSIS — H43813 Vitreous degeneration, bilateral: Secondary | ICD-10-CM

## 2019-05-04 ENCOUNTER — Telehealth: Payer: Self-pay | Admitting: Neurology

## 2019-05-04 NOTE — Telephone Encounter (Signed)
Please give him a follow up visit in 3 months with NP Sarah  He is on Cellcept 500mg  bid, doing well, I have emailed him to wean off in 2 weeks.

## 2019-05-04 NOTE — Telephone Encounter (Signed)
I spoke to the patient to schedule a three month follow up. He has a pending appt on 08/05/19.

## 2019-05-13 ENCOUNTER — Ambulatory Visit: Payer: Medicare PPO | Attending: Internal Medicine

## 2019-05-13 DIAGNOSIS — Z23 Encounter for immunization: Secondary | ICD-10-CM

## 2019-05-13 NOTE — Progress Notes (Signed)
   Covid-19 Vaccination Clinic  Name:  [name]    MRN: [id number] DOB: 1945-08-13  05/13/2019  [name] was observed post Covid-19 immunization for 15 minutes without incident. He was provided with Vaccine Information Sheet and instruction to access the V-Safe system.   [name] was instructed to call 911 with any severe reactions post vaccine: Marland Kitchen Difficulty breathing  . Swelling of face and throat  . A fast heartbeat  . A bad rash all over body  . Dizziness and weakness   Immunizations Administered    Name Date Dose VIS Date Route   Pfizer COVID-19 Vaccine 05/13/2019 10:27 AM 0.3 mL 01/23/2019 Intramuscular   Manufacturer: [employer], [employer]   Lot: [id number]   NDC: [address]-6

## 2019-05-17 ENCOUNTER — Emergency Department (HOSPITAL_COMMUNITY)
Admission: EM | Admit: 2019-05-17 | Discharge: 2019-05-17 | Disposition: A | Discharge status: Home / Self Care | Payer: Medicare PPO | Attending: Emergency Medicine | Admitting: Emergency Medicine

## 2019-05-17 ENCOUNTER — Other Ambulatory Visit: Payer: Self-pay

## 2019-05-17 ENCOUNTER — Encounter (HOSPITAL_COMMUNITY): Payer: Self-pay | Admitting: Emergency Medicine

## 2019-05-17 ENCOUNTER — Emergency Department (HOSPITAL_COMMUNITY): Payer: Medicare PPO

## 2019-05-17 DIAGNOSIS — R1030 Lower abdominal pain, unspecified: Secondary | ICD-10-CM | POA: Insufficient documentation

## 2019-05-17 DIAGNOSIS — Z7901 Long term (current) use of anticoagulants: Secondary | ICD-10-CM | POA: Diagnosis not present

## 2019-05-17 DIAGNOSIS — E876 Hypokalemia: Secondary | ICD-10-CM | POA: Diagnosis not present

## 2019-05-17 DIAGNOSIS — R1032 Left lower quadrant pain: Secondary | ICD-10-CM | POA: Diagnosis present

## 2019-05-17 DIAGNOSIS — Z79899 Other long term (current) drug therapy: Secondary | ICD-10-CM | POA: Insufficient documentation

## 2019-05-17 DIAGNOSIS — Z87891 Personal history of nicotine dependence: Secondary | ICD-10-CM | POA: Diagnosis not present

## 2019-05-17 DIAGNOSIS — R1031 Right lower quadrant pain: Secondary | ICD-10-CM | POA: Insufficient documentation

## 2019-05-17 DIAGNOSIS — I10 Essential (primary) hypertension: Secondary | ICD-10-CM | POA: Diagnosis not present

## 2019-05-17 LAB — URINALYSIS, ROUTINE W REFLEX MICROSCOPIC
Bilirubin Urine: NEGATIVE
Glucose, UA: NEGATIVE mg/dL
Hgb urine dipstick: NEGATIVE
Ketones, ur: 5 mg/dL — AB
Leukocytes,Ua: NEGATIVE
Nitrite: NEGATIVE
Protein, ur: NEGATIVE mg/dL
Specific Gravity, Urine: 1.009 (ref 1.005–1.030)
pH: 7 (ref 5.0–8.0)

## 2019-05-17 LAB — BASIC METABOLIC PANEL
Anion gap: 10 (ref 5–15)
BUN: 14 mg/dL (ref 8–23)
CO2: 27 mmol/L (ref 22–32)
Calcium: 8.9 mg/dL (ref 8.9–10.3)
Chloride: 98 mmol/L (ref 98–111)
Creatinine, Ser: 0.71 mg/dL (ref 0.61–1.24)
GFR calc Af Amer: >60 mL/min (ref >60)
GFR calc non Af Amer: >60 mL/min (ref >60)
Glucose, Bld: 107 mg/dL — ABNORMAL HIGH (ref 70–99)
Potassium: 3 mmol/L — ABNORMAL LOW (ref 3.5–5.1)
Sodium: 135 mmol/L (ref 135–145)

## 2019-05-17 LAB — CBC WITH DIFFERENTIAL/PLATELET
Abs Immature Granulocytes: 0.06 10*3/uL (ref 0.00–0.07)
Basophils Absolute: 0 10*3/uL (ref 0.0–0.1)
Basophils Relative: 0 %
Eosinophils Absolute: 0 10*3/uL (ref 0.0–0.5)
Eosinophils Relative: 0 %
HCT: 48 % (ref 39.0–52.0)
Hemoglobin: 16.1 g/dL (ref 13.0–17.0)
Immature Granulocytes: 0 %
Lymphocytes Relative: 4 %
Lymphs Abs: 0.6 10*3/uL — ABNORMAL LOW (ref 0.7–4.0)
MCH: 32.1 pg (ref 26.0–34.0)
MCHC: 33.5 g/dL (ref 30.0–36.0)
MCV: 95.8 fL (ref 80.0–100.0)
Monocytes Absolute: 1.1 10*3/uL — ABNORMAL HIGH (ref 0.1–1.0)
Monocytes Relative: 6 %
Neutro Abs: 15.5 10*3/uL — ABNORMAL HIGH (ref 1.7–7.7)
Neutrophils Relative %: 90 %
Platelets: 151 10*3/uL (ref 150–400)
RBC: 5.01 MIL/uL (ref 4.22–5.81)
RDW: 12.8 % (ref 11.5–15.5)
WBC: 17.4 10*3/uL — ABNORMAL HIGH (ref 4.0–10.5)
nRBC: 0 % (ref 0.0–0.2)

## 2019-05-17 MED ORDER — MINERAL OIL RE ENEM
1.0000 | ENEMA | Freq: Once | RECTAL | Status: AC
  Administered 2019-05-17: 1 via RECTAL
  Filled 2019-05-17: qty 1

## 2019-05-17 MED ORDER — POTASSIUM CHLORIDE ER 10 MEQ PO TBCR: 10.0000 meq | EXTENDED_RELEASE_TABLET | Freq: Every day | ORAL | 0 refills | Status: DC

## 2019-05-17 MED ORDER — AMOXICILLIN-POT CLAVULANATE 875-125 MG PO TABS
1.0000 | ORAL_TABLET | Freq: Once | ORAL | Status: AC
  Administered 2019-05-17: 1 via ORAL
  Filled 2019-05-17: qty 1

## 2019-05-17 MED ORDER — POTASSIUM CHLORIDE CRYS ER 20 MEQ PO TBCR
40.0000 meq | EXTENDED_RELEASE_TABLET | Freq: Once | ORAL | Status: AC
  Administered 2019-05-17: 40 meq via ORAL
  Filled 2019-05-17: qty 2

## 2019-05-17 MED ORDER — LIDOCAINE HCL URETHRAL/MUCOSAL 2 % EX GEL
1.0000 "application " | Freq: Once | CUTANEOUS | Status: AC
  Administered 2019-05-17: 1 via TOPICAL
  Filled 2019-05-17: qty 30

## 2019-05-17 MED ORDER — SODIUM CHLORIDE 0.9 % IV BOLUS
500.0000 mL | Freq: Once | INTRAVENOUS | Status: AC
  Administered 2019-05-17: 500 mL via INTRAVENOUS

## 2019-05-17 MED ORDER — AMOXICILLIN-POT CLAVULANATE 875-125 MG PO TABS: 1.0000 | ORAL_TABLET | Freq: Two times a day (BID) | ORAL | 0 refills | Status: AC

## 2019-05-17 NOTE — ED Provider Notes (Signed)
[facility]-EMERGENCY DEPT Provider Note   CSN: [id number] Arrival date & time: 05/17/19  1310     History Chief Complaint  Patient presents with  . Constipation  . Abdominal Pain    [name] is a 74 y.o. male history of myasthenia gravis in remission, taken off of CellCept 2 weeks ago, history of hypertension, history of A. fib on Eliquis, presenting to emergency department with lower abdominal pain.  The patient reports that he struggled to have a bowel movement yesterday and feels like he was straining on the toilet.  He said he passed a small amount of stool which was very hard but felt like he was unable to completely evacuate his bowels.  Afterwards he began having constant abdominal pain in his lower abdomen which extends in the left lower quadrant to the right lower quadrant.  He felt mildly nauseated and has belching few times.  He normally is able have a bowel movement once every 2 days with the help of a bowel regimen that he has been on for some time.  He tells me he has some anatomical variations of his bowel has been seen for a GI doctor for this.  He was told in the past that if he ever has difficulty with bowel movements or has fevers and abdominal pain he should come to the emergency department.  No abdominal surgical history  He does report some issues with urinary hesitation recently.  No hx of UTI's.  He got both covid vaccines last month.  Multiple drug contraindications due to his MG, including "some types of iodine."  HPI     Past Medical History:  Diagnosis Date  . A-fib (HCC)   . GERD (gastroesophageal reflux disease)   . History of Bell's palsy   . History of pericarditis   . Hypertension   . Mixed hyperlipidemia   . Myasthenia gravis (HCC)    currently in remission (11/2014)     Patient Active Problem List   Diagnosis Date Noted  . Vertigo 11/04/2017  . Chronic atrial fibrillation (HCC) 11/28/2016  . Hypertension  09/20/2016  . Benign prostatic hyperplasia without lower urinary tract symptoms   . Myasthenia gravis (HCC) 03/26/2016  . Hyperlipidemia 09/12/2015  . Erectile dysfunction 09/12/2015  . History of pericarditis 12/10/2014  . High coronary artery calcium score 12/10/2014    Past Surgical History:  Procedure Laterality Date  . CARDIOVERSION N/A 09/21/2016   Procedure: CARDIOVERSION;  Surgeon: Thurmon Fair, MD;  Location: [facility] ENDOSCOPY;  Service: Cardiovascular;  Laterality: N/A;  . CARDIOVERSION N/A 10/17/2016   Procedure: CARDIOVERSION;  Surgeon: Laurey Morale, MD;  Location: [facility] ENDOSCOPY;  Service: Cardiovascular;  Laterality: N/A;  . CARDIOVERSION N/A 10/31/2016   Procedure: CARDIOVERSION;  Surgeon: Wendall Stade, MD;  Location: [facility] ENDOSCOPY;  Service: Cardiovascular;  Laterality: N/A;  . LAMINECTOMY  1991  . TEE WITHOUT CARDIOVERSION N/A 09/21/2016   Procedure: TRANSESOPHAGEAL ECHOCARDIOGRAM (TEE);  Surgeon: Thurmon Fair, MD;  Location: [facility] ENDOSCOPY;  Service: Cardiovascular;  Laterality: N/A;  . TEE WITHOUT CARDIOVERSION N/A 10/31/2016   Procedure: TRANSESOPHAGEAL ECHOCARDIOGRAM (TEE);  Surgeon: Wendall Stade, MD;  Location: [facility] ENDOSCOPY;  Service: Cardiovascular;  Laterality: N/A;  . TONSILLECTOMY  1950       Family History  Problem Relation Age of Onset  . Cancer Mother   . Heart disease Father   . Cancer Brother        bladder/ in remission  . Healthy Daughter  age 74    Social History   Tobacco Use  . Smoking status: Former Smoker    Types: Pipe    Quit date: 12/10/1966    Years since quitting: 52.4  . Smokeless tobacco: Never Used  Substance Use Topics  . Alcohol use: Yes    Alcohol/week: 1.0 standard drinks    Types: 1 Glasses of wine per week  . Drug use: No    Home Medications Prior to Admission medications   Medication Sig Start Date End Date Taking? Authorizing Provider  Alpha-Lipoic Acid 300 MG CAPS Take 300 mg by mouth daily.    Yes  [provider]  apixaban (ELIQUIS) 5 MG TABS tablet Take 1 tablet (5 mg total) by mouth 2 (two) times daily. 01/29/19  Yes Lyn Records, MD  atorvastatin (LIPITOR) 20 MG tablet TAKE 1 TABLET BY MOUTH DAILY Patient taking differently: Take 20 mg by mouth daily at 6 PM.  10/12/16  Yes Lyn Records, MD  Coenzyme Q10 (COQ-10) 100 MG CAPS Take 100 mg by mouth daily.    Yes [provider]  digoxin (LANOXIN) 0.25 MG tablet Take 1 tablet (0.25 mg total) by mouth daily. 12/01/18  Yes Lyn Records, MD  Multiple Vitamins-Minerals (CENTRUM SILVER PO) Take 1 tablet by mouth daily.   Yes [provider]  Omega-3 Fatty Acids (OMEGA-3 PO) Take 1,040 mg by mouth daily.   Yes [provider]  ramipril (ALTACE) 5 MG capsule Take 5 mg by mouth daily. 11/05/14  Yes [provider]  Resveratrol 100 MG CAPS Take 100 mg by mouth 2 (two) times daily.    Yes [provider]  tamsulosin (FLOMAX) 0.4 MG CAPS capsule Take 0.4 mg by mouth daily. 09/29/14  Yes [provider]  triamterene-hydrochlorothiazide (MAXZIDE-25) 37.5-25 MG tablet Take 1 tablet by mouth daily.    Yes [provider]  TURMERIC PO Take 100 mg by mouth 2 (two) times daily.   Yes [provider]  amoxicillin-clavulanate (AUGMENTIN) 875-125 MG tablet Take 1 tablet by mouth every 12 (twelve) hours for 7 days. 05/18/19 05/25/19  Terald Sleeper, MD  mycophenolate (CELLCEPT) 250 MG capsule Take 1 capsule (250 mg total) by mouth daily. Patient not taking: Reported on 05/17/2019 02/02/19   Huston Foley, MD  potassium chloride (KLOR-CON) 10 MEQ tablet Take 1 tablet (10 mEq total) by mouth daily. 05/18/19 06/17/19  Terald Sleeper, MD    Allergies    Demerol [meperidine], Aminoglycosides, Beta adrenergic blockers, Calcium channel blockers, Ciprofloxacin, Iodinated diagnostic agents, Penicillamine, Procainamide hcl, Quinine derivatives, Succinylcholine chloride, and Vecuronium bromide  [vecuronium]  Review of Systems   Review of Systems  Constitutional: Positive for appetite change and chills. Negative for fever.  Eyes: Negative for pain and visual disturbance.  Respiratory: Negative for cough and shortness of breath.   Cardiovascular: Negative for chest pain and palpitations.  Gastrointestinal: Positive for abdominal pain, constipation and nausea. Negative for vomiting.  Genitourinary: Negative for dysuria and hematuria.  Musculoskeletal: Negative for arthralgias and back pain.  Skin: Negative for color change and rash.  Neurological: Negative for syncope and light-headedness.  Psychiatric/Behavioral: Negative for agitation.  All other systems reviewed and are negative.   Physical Exam Updated Vital Signs BP 127/81   Pulse 71   Temp 98.4 F (36.9 C) (Oral)   Resp 17   Ht 5\' 7"  (1.702 m)   Wt 83 kg   SpO2 97%   BMI 28.66 kg/m   Physical  Exam Vitals and nursing note reviewed.  Constitutional:      Appearance: He is well-developed.  HENT:     Head: Normocephalic and atraumatic.  Eyes:     Conjunctiva/sclera: Conjunctivae normal.  Cardiovascular:     Rate and Rhythm: Normal rate and regular rhythm.  Pulmonary:     Effort: Pulmonary effort is normal. No respiratory distress.  Abdominal:     General: There is no distension.     Palpations: Abdomen is soft.     Tenderness: There is abdominal tenderness in the right lower quadrant, suprapubic area and left lower quadrant. There is no guarding or rebound. Negative signs include Murphy's sign.     Comments: Suprapubic fullness  Musculoskeletal:     Cervical back: Neck supple.  Skin:    General: Skin is warm and dry.  Neurological:     Mental Status: He is alert.  Psychiatric:        Mood and Affect: Mood normal.        Behavior: Behavior normal.     ED Results / Procedures / Treatments   Labs (all labs ordered are listed, but only abnormal results are displayed) Labs Reviewed  BASIC METABOLIC  PANEL - Abnormal; Notable for the following components:      Result Value   Potassium 3.0 (*)    Glucose, Bld 107 (*)    All other components within normal limits  CBC WITH DIFFERENTIAL/PLATELET - Abnormal; Notable for the following components:   WBC 17.4 (*)    Neutro Abs 15.5 (*)    Lymphs Abs 0.6 (*)    Monocytes Absolute 1.1 (*)    All other components within normal limits  URINALYSIS, ROUTINE W REFLEX MICROSCOPIC - Abnormal; Notable for the following components:   Ketones, ur 5 (*)    Bacteria, UA RARE (*)    All other components within normal limits    EKG None  Radiology CT ABDOMEN PELVIS WO CONTRAST  Result Date: 05/17/2019 CLINICAL DATA:  Lower abdominal pain and chills. EXAM: CT ABDOMEN AND PELVIS WITHOUT CONTRAST TECHNIQUE: Multidetector CT imaging of the abdomen and pelvis was performed following the standard protocol without IV contrast. COMPARISON:  CT scan 06/04/2018 FINDINGS: Lower chest: 8 mm nodule just above the right hemidiaphragm is unchanged and likely benign. No other pulmonary nodules. No acute pulmonary findings. Mild right lower lobe bronchiectasis in the medial basilar segment. No pleural effusions. The heart is normal in size. No pericardial effusion. Hepatobiliary: No focal hepatic lesions are identified without contrast. The gallbladder appears normal. No common bile duct dilatation. Pancreas: No mass, inflammation or ductal dilatation. Spleen: Normal size. No focal lesions. Adrenals/Urinary Tract: Bilateral adrenal gland adenomas. Small left renal calculi are noted. Stable left renal cyst. No obstructing ureteral calculi or bladder calculi. No bladder mass is identified without contrast. Stomach/Bowel: The stomach and duodenum are unremarkable. Again demonstrated is a normal variant of non rotated bowel. The small bowel is in the right abdomen and the colon is in the left abdomen. No findings for obstruction. Scattered stool in the colon but I do not see any  significant findings for constipation. Vascular/Lymphatic: Stable atherosclerotic calcifications involving the aorta and branch vessels but no aneurysm. Small scattered mesenteric and retroperitoneal lymph nodes appear stable. Reproductive: Mild prostate gland enlargement. The seminal vesicles are grossly normal. Other: No pelvic mass or adenopathy. No free pelvic fluid collections. No inguinal mass or adenopathy. No abdominal wall hernia or subcutaneous lesions. Musculoskeletal: No significant bony findings. Stable  degenerative changes involving the spine. IMPRESSION: 1. No acute abdominal/pelvic findings, mass lesions or adenopathy. 2. Stable 8 mm right lower lobe pulmonary nodule. 3. Left renal calculi and left renal cyst but no obstructing ureteral calculi. 4. Stable bilateral adrenal gland adenomas. 5. Stable matter rotation (non rotated bowel). No findings for obstruction. 6. Stable atherosclerotic calcifications involving the aorta and branch vessels. Aortic Atherosclerosis (ICD10-I70.0). Electronically Signed   By: Rudie Meyer M.D.   On: 05/17/2019 14:42    Procedures Procedures (including critical care time)  Medications Ordered in ED Medications  sodium chloride 0.9 % bolus 500 mL (0 mLs Intravenous Stopped 05/17/19 1649)  lidocaine (XYLOCAINE) 2 % jelly 1 application (1 application Topical Given by Other 05/17/19 1649)  potassium chloride SA (KLOR-CON) CR tablet 40 mEq (40 mEq Oral Given 05/17/19 1646)  amoxicillin-clavulanate (AUGMENTIN) 875-125 MG per tablet 1 tablet (1 tablet Oral Given 05/17/19 1646)  mineral oil enema 1 enema (1 enema Rectal Given 05/17/19 1648)    ED Course  I have reviewed the triage vital signs and the nursing notes.  Pertinent labs & imaging results that were available during my care of the patient were reviewed by me and considered in my medical decision making (see chart for details).  This patient complains of abdominal pain and constipation .  This involves an  extensive number of treatment options, and is a complaint that carries with it a high risk of complications and morbidity.  The differential diagnosis includes fecal impaction vs. Bowel obstruction vs. Stercolitis/colitis vs. Ileus vs. Hernia vs Other  No evidence of perforation on my exam.  Abdomen is NOT distended. Less likely mesenteric ischemia with his benign exam, no continued GI symptoms.  I ordered, reviewed, and interpreted labs, which included UA, BMP, CBC, CT scan abdomen I ordered medication potassium for hypokalemia, augmentin for colitis I ordered imaging studies which included CT scan abdomen I independently visualized and interpreted imaging which showed no evidence of SBO or significant stool burden, in addition to radiology report Additional history was obtained from his wife at bedside Previous records obtained and reviewed showing kidney stones on CT scan 06/04/2018, normal appendix, bowel malrotation (normal variant) at that time   After the interventions and/or lab and imaging testing stated above, I reevaluated the patient and found he had persistent lower abdominal pain without worsening pain or abdominal distension, and no vomiting, and tolerating PO here.  He had no BM by digital exam or by enema.    CT scan shows no SBO or clear colitis, but it was a non-contrast scan, and he does report a history of significant bacterial colitis in the past due to his anatomical differences.  With his WBC of 17K, I think it's reasonable to start tx with augmentin for colitis.  We'll also treat his low K+ here with PO potassium.  I do think that given his overall clinical appearance, which is benign in the ED, it would be reasonable to discharge him home with his wife and trial the augmentin and soft-liquid diet for the next few days.  I this time I do not suspect ischemia of the bowels, or perforated viscous, or acute appendicitis.  However, I explained that these can be evolving  conditions, and I made it clear that he should return immediately if his pain is worsening, or he has intractable vomiting, or begins having fevers.  He and his wife verbalized understanding of this plan.     Clinical Course as of May 17 2335  Sun May 17, 2019  1547 Attempted rectal exam for disimpaction, no palpable stool ball in rectum.  Will try enema.  Unclear if there is retained stool or simply tenesmus sensation   [MT]  1757 No BM after enema.  He does not feel he has to have BM.  I suspect now there is no stool or impaction.  He is tolerating PO here.  I advised a soft liquid diet and continuing antibiotics for possible colitis (given his elevated WBC), and f/u with his GI doctor or PCP this week.  He can return if his symptoms worsen.  He verbalizes understanding.   [MT]    Clinical Course User Index [MT] Langston Masker Carola Rhine, MD    Final Clinical Impression(s) / ED Diagnoses Final diagnoses:  Lower abdominal pain  Hypokalemia    Rx / DC Orders ED Discharge Orders         Ordered    amoxicillin-clavulanate (AUGMENTIN) 875-125 MG tablet  Every 12 hours     05/17/19 1803    potassium chloride (KLOR-CON) 10 MEQ tablet  Daily     05/17/19 1803           Wyvonnia Dusky, MD 05/17/19 2339

## 2019-05-17 NOTE — ED Notes (Signed)
Discharge paperwork reviewed with pt.  Pt with questions regarding what a soft diet would consist of.  This RN provided a few examples of foods for a soft diet.  Pt with not further questions, ambulatory at discharge.

## 2019-05-17 NOTE — ED Triage Notes (Signed)
Patient here from home with complaints of abd pain below belly button and constipation that started last night after a "hard" bowel movement. Fleets enema with no relief.

## 2019-05-17 NOTE — Discharge Instructions (Signed)
We started you on potassium due to your low level in the ER today.  Your primary care doctor should recheck your level in 1-2 weeks to ensure it is improving.  Your CT scan did not show any clear emergent cause for your abdominal pain.  Specifically we did not see signs of a hernia, bowel obstruction, or colitis.  Your white blood cell count was elevated, and based on your history, I felt it was reasonable to treat you for a possible colitis with 1 week of augmentin.  Please stick to soft liquid diet for the next 2-3 days, with purree's, mashed potatoes, soup, etc.  You need to follow up in the office this week, ideally with your primary care doctor, or else your GI doctor if they are available.    *  Abdominal pain discharge  Abdominal (belly) pain can be caused by many things. Your caregiver performed an examination and possibly ordered blood/urine tests and imaging (CT scan, x-rays, ultrasound). Many cases can be observed and treated at home after initial evaluation in the emergency department.   Even though you are being discharged home, abdominal pain can be unpredictable. Therefore, you need a repeated exam if your pain does not resolve, returns, or worsens. Most patients with abdominal pain don't have to be admitted to the hospital or have surgery, but serious problems like appendicitis and gallbladder attacks can start out as nonspecific pain. Many abdominal conditions cannot be diagnosed in one visit, so follow-up evaluations are very important.  SEEK IMMEDIATE MEDICAL ATTENTION IF: The pain does not go away or becomes severe.  A temperature above 101 develops.  Repeated vomiting occurs (multiple episodes).  The pain becomes localized to portions of the abdomen. The right side could possibly be appendicitis. In an adult, the left lower portion of the abdomen could be colitis or diverticulitis.  Blood is being passed in stools or vomit (bright red or black tarry stools).  Return also if  you develop chest pain, difficulty breathing, dizziness or fainting, or become confused, poorly responsive, or inconsolable (young children).

## 2019-05-17 NOTE — ED Notes (Addendum)
Urine culture sent down to lab with urinalysis. 

## 2019-06-01 ENCOUNTER — Other Ambulatory Visit: Payer: Self-pay

## 2019-06-01 MED ORDER — DIGOXIN 250 MCG PO TABS: 0.2500 mg | ORAL_TABLET | Freq: Every day | ORAL | 2 refills | Status: DC

## 2019-06-16 DIAGNOSIS — R3915 Urgency of urination: Secondary | ICD-10-CM | POA: Diagnosis not present

## 2019-06-16 DIAGNOSIS — R3912 Poor urinary stream: Secondary | ICD-10-CM | POA: Diagnosis not present

## 2019-06-16 DIAGNOSIS — N401 Enlarged prostate with lower urinary tract symptoms: Secondary | ICD-10-CM | POA: Diagnosis not present

## 2019-06-16 DIAGNOSIS — R350 Frequency of micturition: Secondary | ICD-10-CM | POA: Diagnosis not present

## 2019-07-07 ENCOUNTER — Other Ambulatory Visit: Payer: Self-pay | Admitting: Neurology

## 2019-07-07 DIAGNOSIS — R350 Frequency of micturition: Secondary | ICD-10-CM | POA: Diagnosis not present

## 2019-07-07 DIAGNOSIS — R3915 Urgency of urination: Secondary | ICD-10-CM | POA: Diagnosis not present

## 2019-07-09 DIAGNOSIS — L723 Sebaceous cyst: Secondary | ICD-10-CM | POA: Diagnosis not present

## 2019-07-20 DIAGNOSIS — I10 Essential (primary) hypertension: Secondary | ICD-10-CM | POA: Diagnosis not present

## 2019-07-28 ENCOUNTER — Other Ambulatory Visit: Payer: Self-pay | Admitting: *Deleted

## 2019-07-28 MED ORDER — APIXABAN 5 MG PO TABS: 5.0000 mg | ORAL_TABLET | Freq: Two times a day (BID) | ORAL | 5 refills | Status: DC

## 2019-07-28 NOTE — Telephone Encounter (Signed)
Prescription refill request for Eliquis received.  Last office visit: Katrinka Blazing 12/31/2018 Scr: 0.71, 05/17/2019 Age: 74 y.o.  Weight: 83 kg   Prescription refill sent.

## 2019-07-29 DIAGNOSIS — R3915 Urgency of urination: Secondary | ICD-10-CM | POA: Diagnosis not present

## 2019-07-29 DIAGNOSIS — N401 Enlarged prostate with lower urinary tract symptoms: Secondary | ICD-10-CM | POA: Diagnosis not present

## 2019-07-29 DIAGNOSIS — R3912 Poor urinary stream: Secondary | ICD-10-CM | POA: Diagnosis not present

## 2019-08-04 DIAGNOSIS — R3915 Urgency of urination: Secondary | ICD-10-CM | POA: Diagnosis not present

## 2019-08-04 DIAGNOSIS — R350 Frequency of micturition: Secondary | ICD-10-CM | POA: Diagnosis not present

## 2019-08-05 ENCOUNTER — Encounter: Payer: Self-pay | Admitting: Neurology

## 2019-08-05 ENCOUNTER — Other Ambulatory Visit: Payer: Self-pay

## 2019-08-05 ENCOUNTER — Ambulatory Visit: Payer: Medicare PPO | Admitting: Neurology

## 2019-08-05 VITALS — BP 125/79 | HR 82 | Ht 67.0 in | Wt 195.0 lb

## 2019-08-05 DIAGNOSIS — G7000 Myasthenia gravis without (acute) exacerbation: Secondary | ICD-10-CM | POA: Diagnosis not present

## 2019-08-05 MED ORDER — MYCOPHENOLATE MOFETIL 250 MG PO CAPS: 250.0000 mg | ORAL_CAPSULE | Freq: Two times a day (BID) | ORAL | 11 refills | Status: DC

## 2019-08-05 MED ORDER — PYRIDOSTIGMINE BROMIDE 60 MG PO TABS: 60.0000 mg | ORAL_TABLET | Freq: Three times a day (TID) | ORAL | 11 refills | Status: DC | PRN

## 2019-08-05 NOTE — Patient Instructions (Signed)
Start Cellcept 250 mg twice daily Mestinon 60 mg 3 times daily as needed Check blood work Send progress report in 1-2 weeks with update See you back in 3 months
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
## 2019-08-06 ENCOUNTER — Other Ambulatory Visit: Payer: Self-pay

## 2019-08-06 ENCOUNTER — Encounter (INDEPENDENT_AMBULATORY_CARE_PROVIDER_SITE_OTHER): Payer: Medicare PPO | Admitting: Ophthalmology

## 2019-08-06 ENCOUNTER — Encounter: Payer: Self-pay | Admitting: *Deleted

## 2019-08-06 DIAGNOSIS — H353221 Exudative age-related macular degeneration, left eye, with active choroidal neovascularization: Secondary | ICD-10-CM | POA: Diagnosis not present

## 2019-08-06 DIAGNOSIS — H35033 Hypertensive retinopathy, bilateral: Secondary | ICD-10-CM | POA: Diagnosis not present

## 2019-08-06 DIAGNOSIS — I10 Essential (primary) hypertension: Secondary | ICD-10-CM

## 2019-08-06 DIAGNOSIS — H43813 Vitreous degeneration, bilateral: Secondary | ICD-10-CM

## 2019-08-06 LAB — COMPREHENSIVE METABOLIC PANEL
ALT: 37 IU/L (ref 0–44)
AST: 29 IU/L (ref 0–40)
Albumin/Globulin Ratio: 2 (ref 1.2–2.2)
Albumin: 4.3 g/dL (ref 3.7–4.7)
Alkaline Phosphatase: 105 IU/L (ref 48–121)
BUN/Creatinine Ratio: 30 — ABNORMAL HIGH (ref 10–24)
BUN: 25 mg/dL (ref 8–27)
Bilirubin Total: 0.5 mg/dL (ref 0.0–1.2)
CO2: 27 mmol/L (ref 20–29)
Calcium: 9.4 mg/dL (ref 8.6–10.2)
Chloride: 104 mmol/L (ref 96–106)
Creatinine, Ser: 0.82 mg/dL (ref 0.76–1.27)
GFR calc Af Amer: 100 mL/min/{1.73_m2} (ref >59)
GFR calc non Af Amer: 87 mL/min/{1.73_m2} (ref >59)
Globulin, Total: 2.1 g/dL (ref 1.5–4.5)
Glucose: 75 mg/dL (ref 65–99)
Potassium: 3.8 mmol/L (ref 3.5–5.2)
Sodium: 141 mmol/L (ref 134–144)
Total Protein: 6.4 g/dL (ref 6.0–8.5)

## 2019-08-06 LAB — CBC WITH DIFFERENTIAL/PLATELET
Basophils Absolute: 0.1 10*3/uL (ref 0.0–0.2)
Basos: 1 %
EOS (ABSOLUTE): 0.2 10*3/uL (ref 0.0–0.4)
Eos: 2 %
Hematocrit: 46.3 % (ref 37.5–51.0)
Hemoglobin: 15.6 g/dL (ref 13.0–17.7)
Immature Grans (Abs): 0 10*3/uL (ref 0.0–0.1)
Immature Granulocytes: 0 %
Lymphocytes Absolute: 1.1 10*3/uL (ref 0.7–3.1)
Lymphs: 13 %
MCH: 31.8 pg (ref 26.6–33.0)
MCHC: 33.7 g/dL (ref 31.5–35.7)
MCV: 94 fL (ref 79–97)
Monocytes Absolute: 0.7 10*3/uL (ref 0.1–0.9)
Monocytes: 8 %
Neutrophils Absolute: 6.3 10*3/uL (ref 1.4–7.0)
Neutrophils: 76 %
Platelets: 161 10*3/uL (ref 150–450)
RBC: 4.91 x10E6/uL (ref 4.14–5.80)
RDW: 12.6 % (ref 11.6–15.4)
WBC: 8.4 10*3/uL (ref 3.4–10.8)

## 2019-08-12 ENCOUNTER — Telehealth: Payer: Self-pay | Admitting: Interventional Cardiology

## 2019-08-12 NOTE — Telephone Encounter (Signed)
Patient with diagnosis of afib on Eliquis for anticoagulation.    Procedure: rezum prostate procedure Date of procedure: 09/07/19  CHADS2-VASc score of 4 (age x2, HTN, CAD)  CrCl 26mL/min Platelet count 161K  Per office protocol, patient can hold Eliquis for 2 days prior to procedure.

## 2019-08-12 NOTE — Telephone Encounter (Signed)
   [facility] Pre-operative Risk Assessment    HEARTCARE STAFF: - Please ensure there is not already an duplicate clearance open for this procedure. - Under Visit Info/Reason for Call, type in Other and utilize the format Clearance MM/DD/YY or Clearance TBD. Do not use dashes or single digits. - If request is for dental extraction, please clarify the # of teeth to be extracted.  Request for surgical clearance:  1. What type of surgery is being performed? Rezum procedure   2. When is this surgery scheduled? 09/07/19  3. What type of clearance is required (medical clearance vs. Pharmacy clearance to hold med vs. Both)? both  4. Are there any medications that need to be held prior to surgery and how long? Apixaban 2-3 days prior  5. Practice name and name of physician performing surgery? Dr. Festus Aloe / [facility]  6. What is the office phone number? [place]   7.   What is the office fax number? [phone]  8.   Anesthesia type (None, local, MAC, general) ? Nitrous Oxide    [name] 08/12/2019, 4:10 PM  _________________________________________________________________   (provider comments below)

## 2019-08-13 NOTE — Telephone Encounter (Signed)
Called pt he currently has appt scheduled with Dr Katrinka Blazing 09/03/2019 @2 :20 PM. Pt is agreeable to doing cardiac clearance at this appointment. Will forward to requesting provider.

## 2019-08-13 NOTE — Telephone Encounter (Signed)
Primary Cardiologist:Henry Malissa Hippo III, MD  Chart reviewed as part of pre-operative protocol coverage. Because of [name]'s past medical history and time since last visit, he/she will require a follow-up visit in order to better assess preoperative cardiovascular risk.  Pre-op covering staff: - Please schedule appointment and call patient to inform them. - Please contact requesting surgeon's office via preferred method (i.e, phone, fax) to inform them of need for appointment prior to surgery.  If applicable, this message will also be routed to pharmacy pool and/or primary cardiologist for input on holding anticoagulant/antiplatelet agent as requested below so that this information is available at time of patient's appointment.   Ronney Asters, NP  08/13/2019, 7:40 AM

## 2019-08-25 DIAGNOSIS — R3915 Urgency of urination: Secondary | ICD-10-CM | POA: Diagnosis not present

## 2019-08-25 DIAGNOSIS — R350 Frequency of micturition: Secondary | ICD-10-CM | POA: Diagnosis not present

## 2019-09-02 NOTE — Progress Notes (Signed)
Cardiology Office Note:    Date:  09/03/2019   ID:  [name], DOB Aug 19, 1945, MRN [id number]  PCP:  [name], MD  Cardiologist:  Lesleigh Noe, MD   Referring MD: [name], MD   Chief Complaint  Patient presents with   Coronary Artery Disease   Atrial Fibrillation    History of Present Illness:    [name] is a 74 y.o. male with a hx of permanent AF, chronic anticoagulation, essential hypertension, hyperlipidemia, masthenia gravis and nonobstructive CAD by cath 2002 (LAD luminal irregularities).  He feels well.  No angina.  Myasthenia gravis acted up after CellCept was tapered earlier this year.  He has not had syncope, palpitations, angina, neurological complaints, bleeding, or edema.  Denies orthopnea and PND.  Past Medical History:  Diagnosis Date   A-fib (HCC)    GERD (gastroesophageal reflux disease)    History of Bell's palsy    History of pericarditis    Hypertension    Mixed hyperlipidemia    Myasthenia gravis (HCC)    currently in remission (11/2014)     Past Surgical History:  Procedure Laterality Date   CARDIOVERSION N/A 09/21/2016   Procedure: CARDIOVERSION;  Surgeon: Thurmon Fair, MD;  Location: [facility] ENDOSCOPY;  Service: Cardiovascular;  Laterality: N/A;   CARDIOVERSION N/A 10/17/2016   Procedure: CARDIOVERSION;  Surgeon: Laurey Morale, MD;  Location: [facility] ENDOSCOPY;  Service: Cardiovascular;  Laterality: N/A;   CARDIOVERSION N/A 10/31/2016   Procedure: CARDIOVERSION;  Surgeon: Wendall Stade, MD;  Location: [facility] ENDOSCOPY;  Service: Cardiovascular;  Laterality: N/A;   LAMINECTOMY  1991   TEE WITHOUT CARDIOVERSION N/A 09/21/2016   Procedure: TRANSESOPHAGEAL ECHOCARDIOGRAM (TEE);  Surgeon: Thurmon Fair, MD;  Location: [facility] ENDOSCOPY;  Service: Cardiovascular;  Laterality: N/A;   TEE WITHOUT CARDIOVERSION N/A 10/31/2016   Procedure: TRANSESOPHAGEAL ECHOCARDIOGRAM (TEE);  Surgeon: Wendall Stade, MD;  Location:  [facility] ENDOSCOPY;  Service: Cardiovascular;  Laterality: N/A;   TONSILLECTOMY  1950    Current Medications: Current Meds  Medication Sig   Alpha-Lipoic Acid 300 MG CAPS Take 300 mg by mouth daily.    apixaban (ELIQUIS) 5 MG TABS tablet Take 1 tablet (5 mg total) by mouth 2 (two) times daily.   atorvastatin (LIPITOR) 20 MG tablet TAKE 1 TABLET BY MOUTH DAILY   Coenzyme Q10 (COQ-10) 100 MG CAPS Take 100 mg by mouth daily.    digoxin (LANOXIN) 0.25 MG tablet Take 1 tablet (0.25 mg total) by mouth daily.   Multiple Vitamins-Minerals (CENTRUM SILVER PO) Take 1 tablet by mouth daily.   mycophenolate (CELLCEPT) 250 MG capsule Take 1 capsule (250 mg total) by mouth 2 (two) times daily. (Patient taking differently: Take 500 mg by mouth 2 (two) times daily. )   Omega-3 Fatty Acids (OMEGA-3 PO) Take 1,040 mg by mouth daily.   potassium chloride (KLOR-CON) 10 MEQ tablet Take 1 tablet (10 mEq total) by mouth daily.   pyridostigmine (MESTINON) 60 MG tablet Take 1 tablet (60 mg total) by mouth 3 (three) times daily as needed.   ramipril (ALTACE) 5 MG capsule Take 5 mg by mouth daily.   Resveratrol 100 MG CAPS Take 100 mg by mouth 2 (two) times daily.    tamsulosin (FLOMAX) 0.4 MG CAPS capsule Take 0.4 mg by mouth daily.   triamterene-hydrochlorothiazide (MAXZIDE-25) 37.5-25 MG tablet Take 1.5 tablets by mouth daily.    TURMERIC PO Take 100 mg by mouth 2 (two) times daily.     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   Right-handed.   Occasional caffeine use.   Social Determinants of Health   Financial Resource Strain:    Difficulty of Paying Living Expenses:   Food Insecurity:    Worried About Programme researcher, broadcasting/film/videounning Out of Food in the Last Year:    Baristaan Out of Food in the Last Year:   Transportation Needs:    Freight forwarderLack of Transportation (Medical):    Lack of Transportation (Non-Medical):   Physical Activity:    Days of Exercise per Week:    Minutes of Exercise per Session:   Stress:    Feeling of Stress :   Social Connections:    Frequency of Communication with Friends and Family:    Frequency of Social Gatherings with Friends and Family:    Attends Religious Services:    Active Member of Clubs or Organizations:    Attends Engineer, structuralClub or Organization Meetings:    Marital Status:      Family History: The patient's family history includes Cancer in his brother and mother; Healthy in his daughter; Heart disease in his father.  ROS:   Please see the history of present illness.    He had a flare related to myasthenia gravis.  CellCept have been weaned but has now been restarted.  Mestinon is also being used as needed.  He has had no bleeding on apixaban.  All other systems reviewed and are negative.  EKGs/Labs/Other Studies Reviewed:    The following studies were reviewed today: No new data  EKG:  EKG most recent prior EKG was November 2020.  Current EKG demonstrates incomplete right bundle, atrial fibrillation with controlled rate at 80 bpm,  nonspecific ST abnormality.  The prior the heart rate is slightly faster.  Otherwise no change.  Recent Labs: 08/05/2019: ALT 37; BUN 25; Creatinine, Ser 0.82; Hemoglobin 15.6; Platelets 161; Potassium 3.8; Sodium 141  Recent Lipid Panel    Component Value Date/Time   CHOL 144 09/13/2015 1041   CHOL 218 (H) 12/14/2014 0915   TRIG 60 09/13/2015 1041   TRIG 77 12/14/2014 0915   HDL 56 09/13/2015 1041   HDL 52 12/14/2014 0915   CHOLHDL 2.6 09/13/2015 1041   VLDL 12 09/13/2015 1041   LDLCALC 76 09/13/2015 1041   LDLCALC 151 (H) 12/14/2014 0915    Physical Exam:    VS:  BP (!) 90/60    Pulse 80    Ht 5\' 7"  (1.702 m)    Wt 197 lb 12.8  oz (89.7 kg)    SpO2 96%    BMI 30.98 kg/m     Wt Readings from Last 3 Encounters:  09/03/19 197 lb 12.8 oz (89.7 kg)  08/05/19 195 lb (88.5 kg)  05/17/19 183 lb (83 kg)     GEN: Compatible with age and generally unchanged in appearance. No acute distress HEENT: Normal NECK: No JVD. LYMPHATICS: No lymphadenopathy CARDIAC: Regularly irregular RR without murmur, gallop, or edema. VASCULAR:  Normal Pulses. No bruits. RESPIRATORY:  Clear to auscultation without rales, wheezing or rhonchi  ABDOMEN: Soft, non-tender, non-distended, No pulsatile mass, MUSCULOSKELETAL: No deformity  SKIN: Warm and dry NEUROLOGIC:  Alert and oriented x 3 PSYCHIATRIC:  Normal affect   ASSESSMENT:    1. Chronic atrial fibrillation (HCC)   2. Mixed hyperlipidemia   3. Essential hypertension   4. Chronic anticoagulation   5. High coronary artery calcium score   6. Long-term use of high-risk medication   7. Ascending aortic aneurysm (HCC)   8. Educated about COVID-19 virus infection    PLAN:    In order of problems listed above:  1. Rate control is good.  Digoxin is being used.  Doses 0.25 mg/day.  Dig level be obtained today. 2. Continue Lipitor 20 mg/day for an intended target LDL less than 70.  Most recent was 76 in March.   3. Blood pressure target 130/80  mmHg.  Continue Altace and Maxide. 4. No bleeding on Eliquis.  Continue 5 mg twice daily. 5. Secondary risk reduction is being employed.  Briefly reviewed all targets for risk reduction.  Please see as noted below. 6. Eliquis therapy without bleeding complications.  CellCept without obvious complications. 7. Aortic diameter is 4.2 cm based upon echocardiography done in 2020.  This will need to be reassessed prior to the next office visit possibly by CT angiography. 8. The patient has not had Covid and has been vaccinated and is practicing social distancing.  Overall education and awareness concerning primary/secondary risk prevention was discussed in detail: LDL less than 70, hemoglobin A1c less than 7, blood pressure target less than 130/80 mmHg, >150 minutes of moderate aerobic activity per week, avoidance of smoking, weight control (via diet and exercise), and continued surveillance/management of/for obstructive sleep apnea.  Aortic diameter needs to be reassessed within the next 12 months.  This should be done with CT   Medication Adjustments/Labs and Tests Ordered: Current medicines are reviewed at length with the patient today.  Concerns regarding medicines are outlined above.  Orders Placed This Encounter  Procedures   Digoxin level   EKG 12-Lead   No orders of the defined types were placed in this encounter.   Patient Instructions  Medication Instructions:  Your physician recommends that you continue on your current medications as directed. Please refer to the Current Medication list given to you today.  *If you need a refill on your cardiac medications before your next appointment, please call your pharmacy*   Lab Work: Digoxin level today  If you have labs (blood work) drawn today and your tests are completely normal, you will receive your results only by:  MyChart Message (if you have MyChart) OR  A paper copy in the mail If you have any lab test that is abnormal or  we need to change your treatment, we will call you to review the results.   Testing/Procedures: None   Follow-Up: At [facility], you and your health needs are our priority.  As part of our continuing mission  to provide you with exceptional heart care, we have created designated Provider Care Teams.  These Care Teams include your primary Cardiologist (physician) and Advanced Practice Providers (APPs -  Physician Assistants and Nurse Practitioners) who all work together to provide you with the care you need, when you need it.  We recommend signing up for the patient portal called "MyChart".  Sign up information is provided on this After Visit Summary.  MyChart is used to connect with patients for Virtual Visits (Telemedicine).  Patients are able to view lab/test results, encounter notes, upcoming appointments, etc.  Non-urgent messages can be sent to your provider as well.   To learn more about what you can do with MyChart, go to [email or web address].    Your next appointment:   12 month(s)  The format for your next appointment:   In Person  Provider:   You may see Lesleigh Noe, MD or one of the following Advanced Practice Providers on your designated Care Team:    Norma Fredrickson, NP  Nada Boozer, NP  Georgie Chard, NP    Other Instructions      Signed, Lesleigh Noe, MD  09/03/2019 3:58 PM    [facility]

## 2019-09-03 ENCOUNTER — Other Ambulatory Visit: Payer: Self-pay

## 2019-09-03 ENCOUNTER — Encounter: Payer: Self-pay | Admitting: Interventional Cardiology

## 2019-09-03 ENCOUNTER — Ambulatory Visit: Payer: Medicare PPO | Admitting: Interventional Cardiology

## 2019-09-03 VITALS — BP 90/60 | HR 80 | Ht 67.0 in | Wt 197.8 lb

## 2019-09-03 DIAGNOSIS — Z7189 Other specified counseling: Secondary | ICD-10-CM

## 2019-09-03 DIAGNOSIS — Z7901 Long term (current) use of anticoagulants: Secondary | ICD-10-CM | POA: Diagnosis not present

## 2019-09-03 DIAGNOSIS — R931 Abnormal findings on diagnostic imaging of heart and coronary circulation: Secondary | ICD-10-CM

## 2019-09-03 DIAGNOSIS — I7121 Aneurysm of the ascending aorta, without rupture: Secondary | ICD-10-CM

## 2019-09-03 DIAGNOSIS — I712 Thoracic aortic aneurysm, without rupture: Secondary | ICD-10-CM

## 2019-09-03 DIAGNOSIS — I10 Essential (primary) hypertension: Secondary | ICD-10-CM | POA: Diagnosis not present

## 2019-09-03 DIAGNOSIS — Z79899 Other long term (current) drug therapy: Secondary | ICD-10-CM

## 2019-09-03 DIAGNOSIS — I482 Chronic atrial fibrillation, unspecified: Secondary | ICD-10-CM

## 2019-09-03 DIAGNOSIS — E782 Mixed hyperlipidemia: Secondary | ICD-10-CM | POA: Diagnosis not present

## 2019-09-03 NOTE — Patient Instructions (Signed)
Medication Instructions:  Your physician recommends that you continue on your current medications as directed. Please refer to the Current Medication list given to you today.  *If you need a refill on your cardiac medications before your next appointment, please call your pharmacy*   Lab Work: Digoxin level today  If you have labs (blood work) drawn today and your tests are completely normal, you will receive your results only by: Marland Kitchen MyChart Message (if you have MyChart) OR . A paper copy in the mail If you have any lab test that is abnormal or we need to change your treatment, we will call you to review the results.   Testing/Procedures: None   Follow-Up: At [facility], you and your health needs are our priority.  As part of our continuing mission to provide you with exceptional heart care, we have created designated Provider Care Teams.  These Care Teams include your primary Cardiologist (physician) and Advanced Practice Providers (APPs -  Physician Assistants and Nurse Practitioners) who all work together to provide you with the care you need, when you need it.  We recommend signing up for the patient portal called "MyChart".  Sign up information is provided on this After Visit Summary.  MyChart is used to connect with patients for Virtual Visits (Telemedicine).  Patients are able to view lab/test results, encounter notes, upcoming appointments, etc.  Non-urgent messages can be sent to your provider as well.   To learn more about what you can do with MyChart, go to [email or web address].    Your next appointment:   12 month(s)  The format for your next appointment:   In Person  Provider:   You may see Lesleigh Noe, MD or one of the following Advanced Practice Providers on your designated Care Team:    Norma Fredrickson, NP  Nada Boozer, NP  Georgie Chard, NP    Other Instructions

## 2019-09-04 LAB — DIGOXIN LEVEL: Digoxin, Serum: 0.8 ng/mL (ref 0.5–0.9)

## 2019-09-07 DIAGNOSIS — R350 Frequency of micturition: Secondary | ICD-10-CM | POA: Diagnosis not present

## 2019-09-07 DIAGNOSIS — N401 Enlarged prostate with lower urinary tract symptoms: Secondary | ICD-10-CM | POA: Diagnosis not present

## 2019-09-08 ENCOUNTER — Other Ambulatory Visit: Payer: Self-pay

## 2019-09-08 ENCOUNTER — Inpatient Hospital Stay (HOSPITAL_COMMUNITY)
Admission: EM | Admit: 2019-09-08 | Discharge: 2019-09-17 | DRG: 853 | Disposition: A | Discharge status: Home / Self Care | Payer: Medicare PPO | Attending: Internal Medicine | Admitting: Internal Medicine

## 2019-09-08 ENCOUNTER — Emergency Department (HOSPITAL_COMMUNITY): Payer: Medicare PPO

## 2019-09-08 ENCOUNTER — Encounter (HOSPITAL_COMMUNITY): Payer: Self-pay | Admitting: Emergency Medicine

## 2019-09-08 DIAGNOSIS — N3289 Other specified disorders of bladder: Secondary | ICD-10-CM | POA: Diagnosis not present

## 2019-09-08 DIAGNOSIS — K9189 Other postprocedural complications and disorders of digestive system: Secondary | ICD-10-CM | POA: Diagnosis not present

## 2019-09-08 DIAGNOSIS — K352 Acute appendicitis with generalized peritonitis, without abscess: Secondary | ICD-10-CM | POA: Diagnosis not present

## 2019-09-08 DIAGNOSIS — K35891 Other acute appendicitis without perforation, with gangrene: Secondary | ICD-10-CM | POA: Diagnosis present

## 2019-09-08 DIAGNOSIS — K631 Perforation of intestine (nontraumatic): Secondary | ICD-10-CM | POA: Diagnosis not present

## 2019-09-08 DIAGNOSIS — I700 Atherosclerosis of aorta: Secondary | ICD-10-CM | POA: Diagnosis not present

## 2019-09-08 DIAGNOSIS — Z8249 Family history of ischemic heart disease and other diseases of the circulatory system: Secondary | ICD-10-CM

## 2019-09-08 DIAGNOSIS — K3580 Unspecified acute appendicitis: Secondary | ICD-10-CM | POA: Diagnosis not present

## 2019-09-08 DIAGNOSIS — R000 Tachycardia, unspecified: Secondary | ICD-10-CM | POA: Diagnosis present

## 2019-09-08 DIAGNOSIS — Q433 Congenital malformations of intestinal fixation: Secondary | ICD-10-CM

## 2019-09-08 DIAGNOSIS — Z809 Family history of malignant neoplasm, unspecified: Secondary | ICD-10-CM

## 2019-09-08 DIAGNOSIS — Z7901 Long term (current) use of anticoagulants: Secondary | ICD-10-CM

## 2019-09-08 DIAGNOSIS — N200 Calculus of kidney: Secondary | ICD-10-CM | POA: Diagnosis not present

## 2019-09-08 DIAGNOSIS — Z888 Allergy status to other drugs, medicaments and biological substances status: Secondary | ICD-10-CM | POA: Diagnosis not present

## 2019-09-08 DIAGNOSIS — I712 Thoracic aortic aneurysm, without rupture: Secondary | ICD-10-CM | POA: Diagnosis present

## 2019-09-08 DIAGNOSIS — R109 Unspecified abdominal pain: Secondary | ICD-10-CM

## 2019-09-08 DIAGNOSIS — G510 Bell's palsy: Secondary | ICD-10-CM | POA: Diagnosis present

## 2019-09-08 DIAGNOSIS — Z880 Allergy status to penicillin: Secondary | ICD-10-CM | POA: Diagnosis not present

## 2019-09-08 DIAGNOSIS — E669 Obesity, unspecified: Secondary | ICD-10-CM | POA: Diagnosis present

## 2019-09-08 DIAGNOSIS — G7000 Myasthenia gravis without (acute) exacerbation: Secondary | ICD-10-CM | POA: Diagnosis present

## 2019-09-08 DIAGNOSIS — G4733 Obstructive sleep apnea (adult) (pediatric): Secondary | ICD-10-CM | POA: Diagnosis present

## 2019-09-08 DIAGNOSIS — Z20822 Contact with and (suspected) exposure to covid-19: Secondary | ICD-10-CM | POA: Diagnosis not present

## 2019-09-08 DIAGNOSIS — K567 Ileus, unspecified: Secondary | ICD-10-CM | POA: Diagnosis not present

## 2019-09-08 DIAGNOSIS — E782 Mixed hyperlipidemia: Secondary | ICD-10-CM | POA: Diagnosis present

## 2019-09-08 DIAGNOSIS — G8918 Other acute postprocedural pain: Secondary | ICD-10-CM | POA: Diagnosis not present

## 2019-09-08 DIAGNOSIS — Z95828 Presence of other vascular implants and grafts: Secondary | ICD-10-CM

## 2019-09-08 DIAGNOSIS — N400 Enlarged prostate without lower urinary tract symptoms: Secondary | ICD-10-CM | POA: Diagnosis present

## 2019-09-08 DIAGNOSIS — K219 Gastro-esophageal reflux disease without esophagitis: Secondary | ICD-10-CM | POA: Diagnosis present

## 2019-09-08 DIAGNOSIS — A419 Sepsis, unspecified organism: Secondary | ICD-10-CM | POA: Diagnosis not present

## 2019-09-08 DIAGNOSIS — I10 Essential (primary) hypertension: Secondary | ICD-10-CM | POA: Diagnosis present

## 2019-09-08 DIAGNOSIS — K3189 Other diseases of stomach and duodenum: Secondary | ICD-10-CM | POA: Diagnosis not present

## 2019-09-08 DIAGNOSIS — R1030 Lower abdominal pain, unspecified: Secondary | ICD-10-CM | POA: Diagnosis not present

## 2019-09-08 DIAGNOSIS — Z885 Allergy status to narcotic agent status: Secondary | ICD-10-CM | POA: Diagnosis not present

## 2019-09-08 DIAGNOSIS — Z87891 Personal history of nicotine dependence: Secondary | ICD-10-CM

## 2019-09-08 DIAGNOSIS — E785 Hyperlipidemia, unspecified: Secondary | ICD-10-CM | POA: Diagnosis present

## 2019-09-08 DIAGNOSIS — K353 Acute appendicitis with localized peritonitis, without perforation or gangrene: Secondary | ICD-10-CM | POA: Diagnosis not present

## 2019-09-08 DIAGNOSIS — E7800 Pure hypercholesterolemia, unspecified: Secondary | ICD-10-CM | POA: Diagnosis not present

## 2019-09-08 DIAGNOSIS — I482 Chronic atrial fibrillation, unspecified: Secondary | ICD-10-CM | POA: Diagnosis present

## 2019-09-08 DIAGNOSIS — E440 Moderate protein-calorie malnutrition: Secondary | ICD-10-CM | POA: Diagnosis present

## 2019-09-08 DIAGNOSIS — Z79899 Other long term (current) drug therapy: Secondary | ICD-10-CM

## 2019-09-08 DIAGNOSIS — R140 Abdominal distension (gaseous): Secondary | ICD-10-CM | POA: Diagnosis not present

## 2019-09-08 DIAGNOSIS — R1010 Upper abdominal pain, unspecified: Secondary | ICD-10-CM | POA: Diagnosis not present

## 2019-09-08 DIAGNOSIS — K660 Peritoneal adhesions (postprocedural) (postinfection): Secondary | ICD-10-CM | POA: Diagnosis present

## 2019-09-08 DIAGNOSIS — D696 Thrombocytopenia, unspecified: Secondary | ICD-10-CM | POA: Diagnosis not present

## 2019-09-08 DIAGNOSIS — N281 Cyst of kidney, acquired: Secondary | ICD-10-CM | POA: Diagnosis not present

## 2019-09-08 DIAGNOSIS — R0602 Shortness of breath: Secondary | ICD-10-CM | POA: Diagnosis not present

## 2019-09-08 DIAGNOSIS — K3533 Acute appendicitis with perforation and localized peritonitis, with abscess: Secondary | ICD-10-CM | POA: Diagnosis not present

## 2019-09-08 DIAGNOSIS — E876 Hypokalemia: Secondary | ICD-10-CM | POA: Diagnosis not present

## 2019-09-08 DIAGNOSIS — K6389 Other specified diseases of intestine: Secondary | ICD-10-CM | POA: Diagnosis not present

## 2019-09-08 DIAGNOSIS — E46 Unspecified protein-calorie malnutrition: Secondary | ICD-10-CM | POA: Diagnosis not present

## 2019-09-08 DIAGNOSIS — Z6830 Body mass index (BMI) 30.0-30.9, adult: Secondary | ICD-10-CM

## 2019-09-08 HISTORY — DX: Cardiac arrhythmia, unspecified: I49.9

## 2019-09-08 LAB — URINALYSIS, ROUTINE W REFLEX MICROSCOPIC
Glucose, UA: NEGATIVE mg/dL
Ketones, ur: 40 mg/dL — AB
Nitrite: NEGATIVE
Protein, ur: >300 mg/dL — AB
Specific Gravity, Urine: 1.025 (ref 1.005–1.030)
pH: 6 (ref 5.0–8.0)

## 2019-09-08 LAB — COMPREHENSIVE METABOLIC PANEL
ALT: 23 U/L (ref 0–44)
AST: 25 U/L (ref 15–41)
Albumin: 3.6 g/dL (ref 3.5–5.0)
Alkaline Phosphatase: 62 U/L (ref 38–126)
Anion gap: 13 (ref 5–15)
BUN: 17 mg/dL (ref 8–23)
CO2: 26 mmol/L (ref 22–32)
Calcium: 8.9 mg/dL (ref 8.9–10.3)
Chloride: 97 mmol/L — ABNORMAL LOW (ref 98–111)
Creatinine, Ser: 0.64 mg/dL (ref 0.61–1.24)
GFR calc Af Amer: >60 mL/min (ref >60)
GFR calc non Af Amer: >60 mL/min (ref >60)
Glucose, Bld: 112 mg/dL — ABNORMAL HIGH (ref 70–99)
Potassium: 3.9 mmol/L (ref 3.5–5.1)
Sodium: 136 mmol/L (ref 135–145)
Total Bilirubin: 1.9 mg/dL — ABNORMAL HIGH (ref 0.3–1.2)
Total Protein: 6.8 g/dL (ref 6.5–8.1)

## 2019-09-08 LAB — CBC WITH DIFFERENTIAL/PLATELET
Abs Immature Granulocytes: 0.11 10*3/uL — ABNORMAL HIGH (ref 0.00–0.07)
Basophils Absolute: 0 10*3/uL (ref 0.0–0.1)
Basophils Relative: 0 %
Eosinophils Absolute: 0 10*3/uL (ref 0.0–0.5)
Eosinophils Relative: 0 %
HCT: 47.4 % (ref 39.0–52.0)
Hemoglobin: 15.9 g/dL (ref 13.0–17.0)
Immature Granulocytes: 1 %
Lymphocytes Relative: 3 %
Lymphs Abs: 0.4 10*3/uL — ABNORMAL LOW (ref 0.7–4.0)
MCH: 32.6 pg (ref 26.0–34.0)
MCHC: 33.5 g/dL (ref 30.0–36.0)
MCV: 97.1 fL (ref 80.0–100.0)
Monocytes Absolute: 0.5 10*3/uL (ref 0.1–1.0)
Monocytes Relative: 3 %
Neutro Abs: 14.8 10*3/uL — ABNORMAL HIGH (ref 1.7–7.7)
Neutrophils Relative %: 93 %
Platelets: 143 10*3/uL — ABNORMAL LOW (ref 150–400)
RBC: 4.88 MIL/uL (ref 4.22–5.81)
RDW: 13.3 % (ref 11.5–15.5)
WBC: 15.8 10*3/uL — ABNORMAL HIGH (ref 4.0–10.5)
nRBC: 0 % (ref 0.0–0.2)

## 2019-09-08 LAB — I-STAT CHEM 8, ED
BUN: 19 mg/dL (ref 8–23)
Calcium, Ion: 1.11 mmol/L — ABNORMAL LOW (ref 1.15–1.40)
Chloride: 97 mmol/L — ABNORMAL LOW (ref 98–111)
Creatinine, Ser: 0.8 mg/dL (ref 0.61–1.24)
Glucose, Bld: 108 mg/dL — ABNORMAL HIGH (ref 70–99)
HCT: 48 % (ref 39.0–52.0)
Hemoglobin: 16.3 g/dL (ref 13.0–17.0)
Potassium: 3.7 mmol/L (ref 3.5–5.1)
Sodium: 138 mmol/L (ref 135–145)
TCO2: 28 mmol/L (ref 22–32)

## 2019-09-08 LAB — URINALYSIS, MICROSCOPIC (REFLEX)
Bacteria, UA: NONE SEEN
RBC / HPF: >50 RBC/hpf (ref 0–5)
Squamous Epithelial / HPF: NONE SEEN (ref 0–5)

## 2019-09-08 LAB — LIPASE, BLOOD: Lipase: 26 U/L (ref 11–51)

## 2019-09-08 MED ORDER — PIPERACILLIN-TAZOBACTAM 3.375 G IVPB 30 MIN
3.3750 g | Freq: Once | INTRAVENOUS | Status: AC
  Administered 2019-09-09: 3.375 g via INTRAVENOUS
  Filled 2019-09-08: qty 50

## 2019-09-08 MED ORDER — MORPHINE SULFATE (PF) 4 MG/ML IV SOLN
4.0000 mg | Freq: Once | INTRAVENOUS | Status: AC
  Administered 2019-09-08: 4 mg via INTRAVENOUS
  Filled 2019-09-08: qty 1

## 2019-09-08 MED ORDER — SODIUM CHLORIDE 0.9 % IV BOLUS
1000.0000 mL | Freq: Once | INTRAVENOUS | Status: AC
  Administered 2019-09-08: 1000 mL via INTRAVENOUS

## 2019-09-08 NOTE — ED Notes (Signed)
ED Provider at bedside. 

## 2019-09-08 NOTE — ED Triage Notes (Signed)
Patient is complaining of feeling full and having a temperature. Patient had a procedure done on her prostate today and dr told him to come back if he has a temperature or pain.

## 2019-09-08 NOTE — Consult Note (Signed)
CC: Abdominal pain, nausea  Requesting provider: Dr. Silverio Lay  HPI: [name] is an 74 y.o. male  With h/o congenital malrotation, myasthenia gravis on CellCept, chronic atrial fibrillation on Eliquis, hypertension, mixed hyperlipidemia, known ascending aortic aneurysm is here for evaluation after onset of lower abdominal discomfort, nausea, low-grade temperature that started primarily today.  He underwent an outpatient urology procedure yesterday at [facility] for BPH symptoms and had an indwelling catheter left.  He states last night he started having some lower abdominal distention.  Today he developed nausea and low-grade temperature and persistent lower abdominal discomfort and now pain on his left lower side so he came to the emergency room for evaluation.    He had stopped his Eliquis over the weekend for the urology procedure but took 1 dose of it today.  He saw his cardiologist for regular follow-up as well as for clearance for his urology procedure on July 22.  He had a low risk Myoview scan last fall which I reviewed.  I also reviewed his neurologist NP note from 08/05/19.  He had been off his CellCept for several months but had recurrent fatigue with exertion, upper extremity fatigue, some double vision so he was restarted.  He states those symptoms have improved since going back on the medication.  He denies any trouble chewing or swallowing; however he states that he has noticed that he has been coughing some after eating over the past week or so.  He has been having urine output in the Foley leg bag since the procedure.  No hematuria  No prior myasthenia gravis crisis.  Has had some flares in the past.  Past Medical History:  Diagnosis Date  . A-fib (HCC)   . GERD (gastroesophageal reflux disease)   . History of Bell's palsy   . History of pericarditis   . Hypertension   . Mixed hyperlipidemia   . Myasthenia gravis (HCC)    currently in remission (11/2014)      Past Surgical History:  Procedure Laterality Date  . CARDIOVERSION N/A 09/21/2016   Procedure: CARDIOVERSION;  Surgeon: Thurmon Fair, MD;  Location: [facility] ENDOSCOPY;  Service: Cardiovascular;  Laterality: N/A;  . CARDIOVERSION N/A 10/17/2016   Procedure: CARDIOVERSION;  Surgeon: Laurey Morale, MD;  Location: [facility] ENDOSCOPY;  Service: Cardiovascular;  Laterality: N/A;  . CARDIOVERSION N/A 10/31/2016   Procedure: CARDIOVERSION;  Surgeon: Wendall Stade, MD;  Location: [facility] ENDOSCOPY;  Service: Cardiovascular;  Laterality: N/A;  . LAMINECTOMY  1991  . TEE WITHOUT CARDIOVERSION N/A 09/21/2016   Procedure: TRANSESOPHAGEAL ECHOCARDIOGRAM (TEE);  Surgeon: Thurmon Fair, MD;  Location: [facility] ENDOSCOPY;  Service: Cardiovascular;  Laterality: N/A;  . TEE WITHOUT CARDIOVERSION N/A 10/31/2016   Procedure: TRANSESOPHAGEAL ECHOCARDIOGRAM (TEE);  Surgeon: Wendall Stade, MD;  Location: [facility] ENDOSCOPY;  Service: Cardiovascular;  Laterality: N/A;  . TONSILLECTOMY  1950    Family History  Problem Relation Age of Onset  . Cancer Mother   . Heart disease Father   . Cancer Brother        bladder/ in remission  . Healthy Daughter        age 71    Social:  reports that he quit smoking about 52 years ago. His smoking use included pipe. He has never used smokeless tobacco. He reports current alcohol use of about 1.0 standard drink of alcohol per week. He reports that he does not use drugs.  Allergies:  Allergies  Allergen Reactions  . Demerol [Meperidine]  hallucinations  . Aminoglycosides     Can not use due to mysthenia gravis  . Beta Adrenergic Blockers     Can not use due to mysthenia gravis  . Calcium Channel Blockers     Can not use due to mysthenia gravis  . Ciprofloxacin     Can not use due to mysthenia gravis  . Iodinated Diagnostic Agents     Can not use due to mysthenia gravis  . Penicillamine     Can not use due to mysthenia gravis  . Procainamide Hcl     Can not use due to  mysthenia gravis  . Quinine Derivatives     Can not use due to mysthenia gravis  . Succinylcholine Chloride     Can not use due to mysthenia gravis  . Vecuronium Bromide [Vecuronium]     Can not use due to mysthenia gravis    Medications: I have reviewed the patient's current medications.  Results for orders placed or performed during the hospital encounter of 09/08/19 (from the past 48 hour(s))  Urinalysis, Routine w reflex microscopic- may I&O cath if menses     Status: Abnormal   Collection Time: 09/08/19  8:22 PM  Result Value Ref Range   Color, Urine YELLOW YELLOW   APPearance HAZY (A) CLEAR   Specific Gravity, Urine 1.025 1.005 - 1.030   pH 6.0 5.0 - 8.0   Glucose, UA NEGATIVE NEGATIVE mg/dL   Hgb urine dipstick LARGE (A) NEGATIVE   Bilirubin Urine SMALL (A) NEGATIVE   Ketones, ur 40 (A) NEGATIVE mg/dL   Protein, ur >979 (A) NEGATIVE mg/dL   Nitrite NEGATIVE NEGATIVE   Leukocytes,Ua TRACE (A) NEGATIVE    Comment: Performed at [facility], [address] [place], [place] [address]  Urinalysis, Microscopic (reflex)     Status: None   Collection Time: 09/08/19  8:22 PM  Result Value Ref Range   RBC / HPF >50 0 - 5 RBC/hpf   WBC, UA 0-5 0 - 5 WBC/hpf   Bacteria, UA NONE SEEN NONE SEEN   Squamous Epithelial / LPF NONE SEEN 0 - 5    Comment: Performed at [facility], [address], [place], [place] [address]  CBC with Differential/Platelet     Status: Abnormal   Collection Time: 09/08/19 10:24 PM  Result Value Ref Range   WBC 15.8 (H) 4.0 - 10.5 K/uL   RBC 4.88 4.22 - 5.81 MIL/uL   Hemoglobin 15.9 13.0 - 17.0 g/dL   HCT 08.1 39 - 52 %   MCV 97.1 80.0 - 100.0 fL   MCH 32.6 26.0 - 34.0 pg   MCHC 33.5 30.0 - 36.0 g/dL   RDW 44.8 18.5 - 63.1 %   Platelets 143 (L) 150 - 400 K/uL   nRBC 0.0 0.0 - 0.2 %   Neutrophils Relative % 93 %   Neutro Abs 14.8 (H) 1.7 - 7.7 K/uL   Lymphocytes Relative 3 %   Lymphs Abs 0.4 (L) 0.7 - 4.0 K/uL    Monocytes Relative 3 %   Monocytes Absolute 0.5 0 - 1 K/uL   Eosinophils Relative 0 %   Eosinophils Absolute 0.0 0 - 0 K/uL   Basophils Relative 0 %   Basophils Absolute 0.0 0 - 0 K/uL   Immature Granulocytes 1 %   Abs Immature Granulocytes 0.11 (H) 0.00 - 0.07 K/uL    Comment: Performed at [facility], [address] [place], [place]  [id number]  Comprehensive metabolic panel     Status: Abnormal   Collection Time: 09/08/19 10:24 PM  Result Value Ref Range   Sodium 136 135 - 145 mmol/L   Potassium 3.9 3.5 - 5.1 mmol/L   Chloride 97 (L) 98 - 111 mmol/L   CO2 26 22 - 32 mmol/L   Glucose, Bld 112 (H) 70 - 99 mg/dL    Comment: Glucose reference range applies only to samples taken after fasting for at least 8 hours.   BUN 17 8 - 23 mg/dL   Creatinine, Ser 6.040.64 0.61 - 1.24 mg/dL   Calcium 8.9 8.9 - 54.010.3 mg/dL   Total Protein 6.8 6.5 - 8.1 g/dL   Albumin 3.6 3.5 - 5.0 g/dL   AST 25 15 - 41 U/L   ALT 23 0 - 44 U/L   Alkaline Phosphatase 62 38 - 126 U/L   Total Bilirubin 1.9 (H) 0.3 - 1.2 mg/dL   GFR calc non Af Amer >60 >60 mL/min   GFR calc Af Amer >60 >60 mL/min   Anion gap 13 5 - 15    Comment: Performed at [facility], [address] [place], [place] [id number]  Lipase, blood     Status: None   Collection Time: 09/08/19 10:24 PM  Result Value Ref Range   Lipase 26 11 - 51 U/L    Comment: Performed at [facility], [address] [place], [place] [id number]  I-stat chem 8, ED (not at [facility] or [facility])     Status: Abnormal   Collection Time: 09/08/19 10:33 PM  Result Value Ref Range   Sodium 138 135 - 145 mmol/L   Potassium 3.7 3.5 - 5.1 mmol/L   Chloride 97 (L) 98 - 111 mmol/L   BUN 19 8 - 23 mg/dL   Creatinine, Ser 9.560.80 0.61 - 1.24 mg/dL   Glucose, Bld 213108 (H) 70 - 99 mg/dL    Comment: Glucose reference range applies only to samples taken after fasting for at least 8 hours.   Calcium, Ion 1.11 (L) 1.15 - 1.40 mmol/L    TCO2 28 22 - 32 mmol/L   Hemoglobin 16.3 13.0 - 17.0 g/dL   HCT 08.648.0 39 - 52 %    CT ABDOMEN PELVIS WO CONTRAST  Result Date: 09/08/2019 CLINICAL DATA:  Fever and abdominal distension, history of recent prostate procedure EXAM: CT ABDOMEN AND PELVIS WITHOUT CONTRAST TECHNIQUE: Multidetector CT imaging of the abdomen and pelvis was performed following the standard protocol without IV contrast. COMPARISON:  05/17/2019 FINDINGS: Lower chest: Lung bases are clear. Previously seen nodular density in the right lower lobe represent some focal eventration of the diaphragm. No other focal abnormality is noted. Hepatobiliary: No focal liver abnormality is seen. No gallstones, gallbladder wall thickening, or biliary dilatation. Pancreas: Unremarkable. No pancreatic ductal dilatation or surrounding inflammatory changes. Spleen: Normal in size without focal abnormality. Adrenals/Urinary Tract: Adrenal glands demonstrate nodularity stable from the prior exam. No renal calculi are seen. Hypodensity in the left mid kidney is noted consistent with cyst. The bladder is decompressed by Foley catheter. Stomach/Bowel: Non rotation of the bowel is noted with the cecum in the left lower quadrant and the majority of the small bowel on the right. The appendix is visualized but small. Mild dilatation is seen with mild inflammatory changes suspicious for acute appendicitis. Correlate with laboratory values. No other focal bowel abnormality is noted. Vascular/Lymphatic: Aortic atherosclerosis. No enlarged abdominal or pelvic lymph nodes. Reproductive: Prostate  is enlarged. Other: No abdominal wall hernia or abnormality. No abdominopelvic ascites. Musculoskeletal: Degenerative changes of lumbar spine are noted. No acute abnormality is seen. IMPRESSION: Changes suspicious for acute appendicitis in the left lower quadrant due to non rotation of the bowel. These changes have progressed in the interval from the prior exam. The appendix  measures approximately 14 mm in greatest dimension. No abscess is seen. Chronic changes stable from the previous exam. Electronically Signed   By: Alcide Clever M.D.   On: 09/08/2019 21:58    ROS - all of the below systems have been reviewed with the patient and positives are indicated with bold text General: chills, fever or night sweats Eyes: blurry vision or double vision ENT: epistaxis or sore throat Allergy/Immunology: itchy/watery eyes or nasal congestion Hematologic/Lymphatic: bleeding problems, blood clots or swollen lymph nodes Endocrine: temperature intolerance or unexpected weight changes Breast: new or changing breast lumps or nipple discharge Resp: cough, shortness of breath, or wheezing CV: chest pain or dyspnea on exertion GI: as per HPI GU: dysuria, trouble voiding, or hematuria MSK: joint pain or joint stiffness Neuro: TIA or stroke symptoms; see hpi Derm: pruritus and skin lesion changes Psych: anxiety and depression  PE Blood pressure 114/84, pulse (!) 106, temperature 98.4 F (36.9 C), temperature source Oral, resp. rate 17, height 5\' 7"  (1.702 m), weight 89.4 kg, SpO2 96 %. Constitutional: NAD; conversant; no deformities Eyes: Moist conjunctiva; no lid lag; anicteric; PERRL Neck: Trachea midline; no thyromegaly Lungs: Normal respiratory effort; no tactile fremitus CV: irreg irreg; no palpable thrills; no pitting edema GI: Abd soft, mild LLQ TTP, no rebound/guarding; no palpable hepatosplenomegaly GU: foley catheter in place; urine a little dark MSK: no clubbing/cyanosis Psychiatric: Appropriate affect; alert and oriented x3 Lymphatic: No palpable cervical or axillary lymphadenopathy Skin: no rash/lesion/induration  Results for orders placed or performed during the hospital encounter of 09/08/19 (from the past 48 hour(s))  Urinalysis, Routine w reflex microscopic- may I&O cath if menses     Status: Abnormal   Collection Time: 09/08/19  8:22 PM  Result Value  Ref Range   Color, Urine YELLOW YELLOW   APPearance HAZY (A) CLEAR   Specific Gravity, Urine 1.025 1.005 - 1.030   pH 6.0 5.0 - 8.0   Glucose, UA NEGATIVE NEGATIVE mg/dL   Hgb urine dipstick LARGE (A) NEGATIVE   Bilirubin Urine SMALL (A) NEGATIVE   Ketones, ur 40 (A) NEGATIVE mg/dL   Protein, ur 09/10/19 (A) NEGATIVE mg/dL   Nitrite NEGATIVE NEGATIVE   Leukocytes,Ua TRACE (A) NEGATIVE    Comment: Performed at [facility], [address] [place], [place]  Urinalysis, Microscopic (reflex)     Status: None   Collection Time: 09/08/19  8:22 PM  Result Value Ref Range   RBC / HPF >50 0 - 5 RBC/hpf   WBC, UA 0-5 0 - 5 WBC/hpf   Bacteria, UA NONE SEEN NONE SEEN   Squamous Epithelial / LPF NONE SEEN 0 - 5    Comment: Performed at [facility], [address] [place], [place]  CBC with Differential/Platelet     Status: Abnormal   Collection Time: 09/08/19 10:24 PM  Result Value Ref Range   WBC 15.8 (H) 4.0 - 10.5 K/uL   RBC 4.88 4.22 - 5.81 MIL/uL   Hemoglobin 15.9 13.0 - 17.0 g/dL   HCT 09/10/19 39 - 52 %   MCV 97.1 80.0 - 100.0 fL   MCH 32.6 26.0 -  34.0 pg   MCHC 33.5 30.0 - 36.0 g/dL   RDW 40.9 81.1 - 91.4 %   Platelets 143 (L) 150 - 400 K/uL   nRBC 0.0 0.0 - 0.2 %   Neutrophils Relative % 93 %   Neutro Abs 14.8 (H) 1.7 - 7.7 K/uL   Lymphocytes Relative 3 %   Lymphs Abs 0.4 (L) 0.7 - 4.0 K/uL   Monocytes Relative 3 %   Monocytes Absolute 0.5 0 - 1 K/uL   Eosinophils Relative 0 %   Eosinophils Absolute 0.0 0 - 0 K/uL   Basophils Relative 0 %   Basophils Absolute 0.0 0 - 0 K/uL   Immature Granulocytes 1 %   Abs Immature Granulocytes 0.11 (H) 0.00 - 0.07 K/uL    Comment: Performed at [facility], [address], [place], [place] [address]  Comprehensive metabolic panel     Status: Abnormal   Collection Time: 09/08/19 10:24 PM  Result Value Ref Range   Sodium 136 135 - 145 mmol/L   Potassium 3.9 3.5 -  5.1 mmol/L   Chloride 97 (L) 98 - 111 mmol/L   CO2 26 22 - 32 mmol/L   Glucose, Bld 112 (H) 70 - 99 mg/dL    Comment: Glucose reference range applies only to samples taken after fasting for at least 8 hours.   BUN 17 8 - 23 mg/dL   Creatinine, Ser 6.21 0.61 - 1.24 mg/dL   Calcium 8.9 8.9 - 30.8 mg/dL   Total Protein 6.8 6.5 - 8.1 g/dL   Albumin 3.6 3.5 - 5.0 g/dL   AST 25 15 - 41 U/L   ALT 23 0 - 44 U/L   Alkaline Phosphatase 62 38 - 126 U/L   Total Bilirubin 1.9 (H) 0.3 - 1.2 mg/dL   GFR calc non Af Amer >60 >60 mL/min   GFR calc Af Amer >60 >60 mL/min   Anion gap 13 5 - 15    Comment: Performed at [facility], [address], [place], [place] [address]  Lipase, blood     Status: None   Collection Time: 09/08/19 10:24 PM  Result Value Ref Range   Lipase 26 11 - 51 U/L    Comment: Performed at [facility], [address] [place], [place] [address]  I-stat chem 8, ED (not at [facility] or [facility])     Status: Abnormal   Collection Time: 09/08/19 10:33 PM  Result Value Ref Range   Sodium 138 135 - 145 mmol/L   Potassium 3.7 3.5 - 5.1 mmol/L   Chloride 97 (L) 98 - 111 mmol/L   BUN 19 8 - 23 mg/dL   Creatinine, Ser 5.28 0.61 - 1.24 mg/dL   Glucose, Bld 413 (H) 70 - 99 mg/dL    Comment: Glucose reference range applies only to samples taken after fasting for at least 8 hours.   Calcium, Ion 1.11 (L) 1.15 - 1.40 mmol/L   TCO2 28 22 - 32 mmol/L   Hemoglobin 16.3 13.0 - 17.0 g/dL   HCT 24.4 39 - 52 %    CT ABDOMEN PELVIS WO CONTRAST  Result Date: 09/08/2019 CLINICAL DATA:  Fever and abdominal distension, history of recent prostate procedure EXAM: CT ABDOMEN AND PELVIS WITHOUT CONTRAST TECHNIQUE: Multidetector CT imaging of the abdomen and pelvis was performed following the standard protocol without IV contrast. COMPARISON:  05/17/2019 FINDINGS: Lower chest: Lung bases are clear. Previously seen nodular density in the right lower lobe  represent some focal  eventration of the diaphragm. No other focal abnormality is noted. Hepatobiliary: No focal liver abnormality is seen. No gallstones, gallbladder wall thickening, or biliary dilatation. Pancreas: Unremarkable. No pancreatic ductal dilatation or surrounding inflammatory changes. Spleen: Normal in size without focal abnormality. Adrenals/Urinary Tract: Adrenal glands demonstrate nodularity stable from the prior exam. No renal calculi are seen. Hypodensity in the left mid kidney is noted consistent with cyst. The bladder is decompressed by Foley catheter. Stomach/Bowel: Non rotation of the bowel is noted with the cecum in the left lower quadrant and the majority of the small bowel on the right. The appendix is visualized but small. Mild dilatation is seen with mild inflammatory changes suspicious for acute appendicitis. Correlate with laboratory values. No other focal bowel abnormality is noted. Vascular/Lymphatic: Aortic atherosclerosis. No enlarged abdominal or pelvic lymph nodes. Reproductive: Prostate is enlarged. Other: No abdominal wall hernia or abnormality. No abdominopelvic ascites. Musculoskeletal: Degenerative changes of lumbar spine are noted. No acute abnormality is seen. IMPRESSION: Changes suspicious for acute appendicitis in the left lower quadrant due to non rotation of the bowel. These changes have progressed in the interval from the prior exam. The appendix measures approximately 14 mm in greatest dimension. No abscess is seen. Chronic changes stable from the previous exam. Electronically Signed   By: Alcide Clever M.D.   On: 09/08/2019 21:58    Imaging: Personally reviewed  A/P: [name] is an 74 y.o. male with  Acute appendicitis (LLQ) Malrotation Myasthenia gravis Chronic atrial fibrillation on Eliquis (1 dose today otherwise last dose was last Saturday) BPH status post BPH procedure yesterday with indwelling Foley catheter Hypertension Hyperlipidemia Ascending aortic  aneurysm  I do not think he needs urgent surgical intervention this evening.  No signs of perforation.  He is resting comfortably.  He only has mild tenderness.  And given his myasthenia gravis I do not think we need to contemplate surgery at night.  Given the constellation of chronic medical conditions I recommend medicine admission with a surgical consultation  We discussed management of appendicitis which normally includes IV antibiotics and surgery.  We also discussed IV antibiotics alone.  We did discuss the risk of recurrent appendicitis with antibiotic management alone.  We also discussed that since he has some immunosuppression medication on board that antibiotic management alone may be a little bit higher risk.  We also discussed that with that surgery would involve general anesthesia which could induce a myasthenic crisis or cholinergic crisis.  I am not sure if his issue with having some coughing after eating is due to his myasthenia gravis but would need to mention that to neurology to see if they feel that increases his risk of ventilator and/or myasthenic crisis  Therefore I think we need to discuss his case with anesthesia as well as neurology in the morning.  He just saw the cardiologist and had cardiac clearance I do not think we need to reengage the cardiologist again per se  He and his wife appreciates the coordination of care.  But would prefer to have his appendix removed  He would not be a candidate for our entire E Ras protocol-would need to avoid gabapentin, Exparel, and IV lidocaine.  Would recommend IV Zosyn Hold Eliquis Okay with continuing CellCept-Will need to discuss with neurology.  Do not think he necessarily needs stress dose steroids but again defer to neurology.  NPO except meds Ok for chemical vte prophylaxis  Will discuss with day surgery team  Mary Sella. Andrey Campanile, MD, FACS General, Bariatric, & Minimally Invasive Surgery [facility] Surgery,  [place]

## 2019-09-08 NOTE — ED Provider Notes (Signed)
[facility]-EMERGENCY DEPT Provider Note   CSN: [id number] Arrival date & time: 09/08/19  1959     History Chief Complaint  Patient presents with  . Post-op Problem    [name] is a 74 y.o. male hx of afib on eliquis, Myasthenia gravis, HL, HTN, here presenting with low-grade temperature, abdominal distention. Patient states that yesterday, he had urologic procedure done [facility]) and had a Foley placed.  He was put on Keflex.  He states that he has been taking Tylenol but developed a low-grade temp of 100 today.  He states that he also has some abdominal distention and pain as well.  Denies any vomiting.  The history is provided by the patient.       Past Medical History:  Diagnosis Date  . A-fib (HCC)   . GERD (gastroesophageal reflux disease)   . History of Bell's palsy   . History of pericarditis   . Hypertension   . Mixed hyperlipidemia   . Myasthenia gravis (HCC)    currently in remission (11/2014)     Patient Active Problem List   Diagnosis Date Noted  . Vertigo 11/04/2017  . Chronic atrial fibrillation (HCC) 11/28/2016  . Hypertension 09/20/2016  . Benign prostatic hyperplasia without lower urinary tract symptoms   . Myasthenia gravis (HCC) 03/26/2016  . Hyperlipidemia 09/12/2015  . Erectile dysfunction 09/12/2015  . History of pericarditis 12/10/2014  . High coronary artery calcium score 12/10/2014    Past Surgical History:  Procedure Laterality Date  . CARDIOVERSION N/A 09/21/2016   Procedure: CARDIOVERSION;  Surgeon: Thurmon Fair, MD;  Location: [facility] ENDOSCOPY;  Service: Cardiovascular;  Laterality: N/A;  . CARDIOVERSION N/A 10/17/2016   Procedure: CARDIOVERSION;  Surgeon: Laurey Morale, MD;  Location: [facility] ENDOSCOPY;  Service: Cardiovascular;  Laterality: N/A;  . CARDIOVERSION N/A 10/31/2016   Procedure: CARDIOVERSION;  Surgeon: Wendall Stade, MD;  Location: [facility] ENDOSCOPY;  Service: Cardiovascular;  Laterality: N/A;  .  LAMINECTOMY  1991  . TEE WITHOUT CARDIOVERSION N/A 09/21/2016   Procedure: TRANSESOPHAGEAL ECHOCARDIOGRAM (TEE);  Surgeon: Thurmon Fair, MD;  Location: [facility] ENDOSCOPY;  Service: Cardiovascular;  Laterality: N/A;  . TEE WITHOUT CARDIOVERSION N/A 10/31/2016   Procedure: TRANSESOPHAGEAL ECHOCARDIOGRAM (TEE);  Surgeon: Wendall Stade, MD;  Location: [facility] ENDOSCOPY;  Service: Cardiovascular;  Laterality: N/A;  . TONSILLECTOMY  1950       Family History  Problem Relation Age of Onset  . Cancer Mother   . Heart disease Father   . Cancer Brother        bladder/ in remission  . Healthy Daughter        age 3    Social History   Tobacco Use  . Smoking status: Former Smoker    Types: Pipe    Quit date: 12/10/1966    Years since quitting: 52.7  . Smokeless tobacco: Never Used  Vaping Use  . Vaping Use: Never used  Substance Use Topics  . Alcohol use: Yes    Alcohol/week: 1.0 standard drink    Types: 1 Glasses of wine per week  . Drug use: No    Home Medications Prior to Admission medications   Medication Sig Start Date End Date Taking? Authorizing Provider  Alpha-Lipoic Acid 300 MG CAPS Take 300 mg by mouth daily.     [provider]  apixaban (ELIQUIS) 5 MG TABS tablet Take 1 tablet (5 mg total) by mouth 2 (two) times daily. 07/28/19   Lyn Records, MD  atorvastatin (  LIPITOR) 20 MG tablet TAKE 1 TABLET BY MOUTH DAILY 10/12/16   Lyn RecordsSmith, Henry W, MD  Coenzyme Q10 (COQ-10) 100 MG CAPS Take 100 mg by mouth daily.     [provider]  digoxin (LANOXIN) 0.25 MG tablet Take 1 tablet (0.25 mg total) by mouth daily. 06/01/19   Lyn RecordsSmith, Henry W, MD  Multiple Vitamins-Minerals (CENTRUM SILVER PO) Take 1 tablet by mouth daily.    [provider]  mycophenolate (CELLCEPT) 250 MG capsule Take 1 capsule (250 mg total) by mouth 2 (two) times daily. Patient taking differently: Take 500 mg by mouth 2 (two) times daily.  08/05/19   Glean SalvoSlack, Sarah J, NP  Omega-3 Fatty Acids  (OMEGA-3 PO) Take 1,040 mg by mouth daily.    [provider]  potassium chloride (KLOR-CON) 10 MEQ tablet Take 1 tablet (10 mEq total) by mouth daily. 05/18/19 09/03/19  Terald Sleeperrifan, Matthew J, MD  pyridostigmine (MESTINON) 60 MG tablet Take 1 tablet (60 mg total) by mouth 3 (three) times daily as needed. 08/05/19   Glean SalvoSlack, Sarah J, NP  ramipril (ALTACE) 5 MG capsule Take 5 mg by mouth daily. 11/05/14   [provider]  Resveratrol 100 MG CAPS Take 100 mg by mouth 2 (two) times daily.     [provider]  tamsulosin (FLOMAX) 0.4 MG CAPS capsule Take 0.4 mg by mouth daily. 09/29/14   [provider]  triamterene-hydrochlorothiazide (MAXZIDE-25) 37.5-25 MG tablet Take 1.5 tablets by mouth daily.     [provider]  TURMERIC PO Take 100 mg by mouth 2 (two) times daily.    [provider]    Allergies    Demerol [meperidine], Aminoglycosides, Beta adrenergic blockers, Calcium channel blockers, Ciprofloxacin, Iodinated diagnostic agents, Penicillamine, Procainamide hcl, Quinine derivatives, Succinylcholine chloride, and Vecuronium bromide [vecuronium]  Review of Systems   Review of Systems  Gastrointestinal: Positive for abdominal distention.  All other systems reviewed and are negative.   Physical Exam Updated Vital Signs BP 114/84 (BP Location: Right Arm)   Pulse (!) 106   Temp 98.4 F (36.9 C) (Oral)   Resp 17   Ht 5\' 7"  (1.702 m)   Wt 89.4 kg   SpO2 96%   BMI 30.85 kg/m   Physical Exam Vitals and nursing note reviewed.  HENT:     Head: Normocephalic.     Nose: Nose normal.     Mouth/Throat:     Mouth: Mucous membranes are moist.  Eyes:     Extraocular Movements: Extraocular movements intact.     Pupils: Pupils are equal, round, and reactive to light.  Cardiovascular:     Rate and Rhythm: Normal rate and regular rhythm.     Pulses: Normal pulses.     Heart sounds: Normal heart sounds.  Pulmonary:     Effort: Pulmonary effort is  normal.     Breath sounds: Normal breath sounds.  Abdominal:     General: Abdomen is flat.     Comments: + distention, mild LLQ tenderness   Musculoskeletal:        General: Normal range of motion.     Cervical back: Normal range of motion.  Skin:    General: Skin is warm.     Capillary Refill: Capillary refill takes less than 2 seconds.  Neurological:     General: No focal deficit present.     Mental Status: He is alert and oriented to person, place, and time.  Psychiatric:  Mood and Affect: Mood normal.        Behavior: Behavior normal.     ED Results / Procedures / Treatments   Labs (all labs ordered are listed, but only abnormal results are displayed) Labs Reviewed  URINALYSIS, ROUTINE W REFLEX MICROSCOPIC - Abnormal; Notable for the following components:      Result Value   APPearance HAZY (*)    Hgb urine dipstick LARGE (*)    Bilirubin Urine SMALL (*)    Ketones, ur 40 (*)    Protein, ur >300 (*)    Leukocytes,Ua TRACE (*)    All other components within normal limits  CBC WITH DIFFERENTIAL/PLATELET - Abnormal; Notable for the following components:   WBC 15.8 (*)    Platelets 143 (*)    Neutro Abs 14.8 (*)    Lymphs Abs 0.4 (*)    Abs Immature Granulocytes 0.11 (*)    All other components within normal limits  COMPREHENSIVE METABOLIC PANEL - Abnormal; Notable for the following components:   Chloride 97 (*)    Glucose, Bld 112 (*)    Total Bilirubin 1.9 (*)    All other components within normal limits  URINE CULTURE  SARS CORONAVIRUS 2 BY RT PCR (HOSPITAL ORDER, PERFORMED IN [facility])  LIPASE, BLOOD  URINALYSIS, MICROSCOPIC (REFLEX)  I-STAT CHEM 8, ED    EKG EKG Interpretation  Date/Time:  Tuesday September 08 2019 20:58:56 EDT Ventricular Rate:  114 PR Interval:    QRS Duration: 118 QT Interval:  339 QTC Calculation: 467 R Axis:   5 Text Interpretation: Atrial fibrillation Incomplete right bundle branch block Nonspecific T  abnormalities, lateral leads Since last tracing rate faster Confirmed by Richardean Canal [phone]) on 09/08/2019 9:08:01 PM   Radiology CT ABDOMEN PELVIS WO CONTRAST  Result Date: 09/08/2019 CLINICAL DATA:  Fever and abdominal distension, history of recent prostate procedure EXAM: CT ABDOMEN AND PELVIS WITHOUT CONTRAST TECHNIQUE: Multidetector CT imaging of the abdomen and pelvis was performed following the standard protocol without IV contrast. COMPARISON:  05/17/2019 FINDINGS: Lower chest: Lung bases are clear. Previously seen nodular density in the right lower lobe represent some focal eventration of the diaphragm. No other focal abnormality is noted. Hepatobiliary: No focal liver abnormality is seen. No gallstones, gallbladder wall thickening, or biliary dilatation. Pancreas: Unremarkable. No pancreatic ductal dilatation or surrounding inflammatory changes. Spleen: Normal in size without focal abnormality. Adrenals/Urinary Tract: Adrenal glands demonstrate nodularity stable from the prior exam. No renal calculi are seen. Hypodensity in the left mid kidney is noted consistent with cyst. The bladder is decompressed by Foley catheter. Stomach/Bowel: Non rotation of the bowel is noted with the cecum in the left lower quadrant and the majority of the small bowel on the right. The appendix is visualized but small. Mild dilatation is seen with mild inflammatory changes suspicious for acute appendicitis. Correlate with laboratory values. No other focal bowel abnormality is noted. Vascular/Lymphatic: Aortic atherosclerosis. No enlarged abdominal or pelvic lymph nodes. Reproductive: Prostate is enlarged. Other: No abdominal wall hernia or abnormality. No abdominopelvic ascites. Musculoskeletal: Degenerative changes of lumbar spine are noted. No acute abnormality is seen. IMPRESSION: Changes suspicious for acute appendicitis in the left lower quadrant due to non rotation of the bowel. These changes have progressed in the  interval from the prior exam. The appendix measures approximately 14 mm in greatest dimension. No abscess is seen. Chronic changes stable from the previous exam. Electronically Signed   By: Eulah Pont.D.  On: 09/08/2019 21:58    Procedures Procedures (including critical care time)  Medications Ordered in ED Medications  piperacillin-tazobactam (ZOSYN) IVPB 3.375 g (has no administration in time range)  sodium chloride 0.9 % bolus 1,000 mL (1,000 mLs Intravenous New Bag/Given 09/08/19 2233)  morphine 4 MG/ML injection 4 mg (4 mg Intravenous Given 09/08/19 2233)    ED Course  I have reviewed the triage vital signs and the nursing notes.  Pertinent labs & imaging results that were available during my care of the patient were reviewed by me and considered in my medical decision making (see chart for details).    MDM Rules/Calculators/A&P                          [name] is a 74 y.o. male here with low-grade temperature, abdominal distention.  Consider SBO versus UTI versus ileus.  Plan to get CBC, CMP, UA, urine culture, CT abdomen pelvis.  11:08 PM CT showed appendicitis. White blood cell count is 15.8. I discussed with Dr. Andrey Campanile from surgery. He states that since patient has myasthenia gravis and A. fib, recommend medicine admission with IV antibiotics. He will see patient as a consult.  Final Clinical Impression(s) / ED Diagnoses Final diagnoses:  None    Rx / DC Orders ED Discharge Orders    None       Charlynne Pander, MD 09/08/19 2308

## 2019-09-09 ENCOUNTER — Inpatient Hospital Stay (HOSPITAL_COMMUNITY): Payer: Medicare PPO

## 2019-09-09 DIAGNOSIS — G7000 Myasthenia gravis without (acute) exacerbation: Secondary | ICD-10-CM

## 2019-09-09 DIAGNOSIS — I482 Chronic atrial fibrillation, unspecified: Secondary | ICD-10-CM

## 2019-09-09 DIAGNOSIS — E7800 Pure hypercholesterolemia, unspecified: Secondary | ICD-10-CM

## 2019-09-09 DIAGNOSIS — K3580 Unspecified acute appendicitis: Secondary | ICD-10-CM

## 2019-09-09 LAB — CBC
HCT: 47 % (ref 39.0–52.0)
Hemoglobin: 15.4 g/dL (ref 13.0–17.0)
MCH: 32.6 pg (ref 26.0–34.0)
MCHC: 32.8 g/dL (ref 30.0–36.0)
MCV: 99.4 fL (ref 80.0–100.0)
Platelets: 128 10*3/uL — ABNORMAL LOW (ref 150–400)
RBC: 4.73 MIL/uL (ref 4.22–5.81)
RDW: 13.5 % (ref 11.5–15.5)
WBC: 15.7 10*3/uL — ABNORMAL HIGH (ref 4.0–10.5)
nRBC: 0 % (ref 0.0–0.2)

## 2019-09-09 LAB — BRAIN NATRIURETIC PEPTIDE: B Natriuretic Peptide: 114.1 pg/mL — ABNORMAL HIGH (ref 0.0–100.0)

## 2019-09-09 LAB — SARS CORONAVIRUS 2 BY RT PCR (HOSPITAL ORDER, PERFORMED IN ~~LOC~~ HOSPITAL LAB): SARS Coronavirus 2: NEGATIVE

## 2019-09-09 LAB — BILIRUBIN, FRACTIONATED(TOT/DIR/INDIR)
Bilirubin, Direct: 0.4 mg/dL — ABNORMAL HIGH (ref 0.0–0.2)
Indirect Bilirubin: 1.3 mg/dL — ABNORMAL HIGH (ref 0.3–0.9)
Total Bilirubin: 1.7 mg/dL — ABNORMAL HIGH (ref 0.3–1.2)

## 2019-09-09 LAB — LACTIC ACID, PLASMA: Lactic Acid, Venous: 1 mmol/L (ref 0.5–1.9)

## 2019-09-09 MED ORDER — CHLORHEXIDINE GLUCONATE CLOTH 2 % EX PADS
6.0000 | MEDICATED_PAD | Freq: Once | CUTANEOUS | Status: AC
  Administered 2019-09-10 (×2): 6 via TOPICAL

## 2019-09-09 MED ORDER — DIGOXIN 250 MCG PO TABS: 0.2500 mg | ORAL_TABLET | Freq: Every day | ORAL | Status: DC

## 2019-09-09 MED ORDER — DIGOXIN 125 MCG PO TABS
0.2500 mg | ORAL_TABLET | Freq: Every day | ORAL | Status: DC
  Administered 2019-09-10 – 2019-09-11 (×2): 0.25 mg via ORAL
  Filled 2019-09-09 (×2): qty 2

## 2019-09-09 MED ORDER — MORPHINE SULFATE (PF) 2 MG/ML IV SOLN
2.0000 mg | INTRAVENOUS | Status: DC | PRN
  Administered 2019-09-09 – 2019-09-10 (×7): 2 mg via INTRAVENOUS
  Filled 2019-09-09 (×7): qty 1

## 2019-09-09 MED ORDER — ACETAMINOPHEN 325 MG PO TABS
650.0000 mg | ORAL_TABLET | Freq: Four times a day (QID) | ORAL | Status: DC | PRN
  Administered 2019-09-11: 650 mg via ORAL
  Filled 2019-09-09: qty 2

## 2019-09-09 MED ORDER — SODIUM CHLORIDE 0.9 % IV SOLN: INTRAVENOUS | Status: DC

## 2019-09-09 MED ORDER — HEPARIN SODIUM (PORCINE) 5000 UNIT/ML IJ SOLN
5000.0000 [IU] | Freq: Three times a day (TID) | INTRAMUSCULAR | Status: DC
  Administered 2019-09-09 – 2019-09-16 (×23): 5000 [IU] via SUBCUTANEOUS
  Filled 2019-09-09 (×22): qty 1

## 2019-09-09 MED ORDER — TAMSULOSIN HCL 0.4 MG PO CAPS
0.4000 mg | ORAL_CAPSULE | Freq: Every day | ORAL | Status: DC
  Administered 2019-09-09 – 2019-09-17 (×9): 0.4 mg via ORAL
  Filled 2019-09-09 (×9): qty 1

## 2019-09-09 MED ORDER — SODIUM CHLORIDE 0.9 % IV SOLN: INTRAVENOUS | Status: AC

## 2019-09-09 MED ORDER — ATORVASTATIN CALCIUM 20 MG PO TABS
20.0000 mg | ORAL_TABLET | Freq: Every day | ORAL | Status: DC
  Administered 2019-09-09 – 2019-09-17 (×9): 20 mg via ORAL
  Filled 2019-09-09: qty 1
  Filled 2019-09-09: qty 2
  Filled 2019-09-09 (×7): qty 1

## 2019-09-09 MED ORDER — TRIAMTERENE-HCTZ 37.5-25 MG PO TABS
1.5000 | ORAL_TABLET | Freq: Every day | ORAL | Status: DC
  Administered 2019-09-09 – 2019-09-17 (×9): 1.5 via ORAL
  Filled 2019-09-09 (×9): qty 1.5

## 2019-09-09 MED ORDER — DIGOXIN 250 MCG PO TABS
0.2500 mg | ORAL_TABLET | Freq: Every day | ORAL | Status: DC
  Filled 2019-09-09: qty 1

## 2019-09-09 MED ORDER — PIPERACILLIN-TAZOBACTAM 3.375 G IVPB
3.3750 g | Freq: Three times a day (TID) | INTRAVENOUS | Status: DC
  Administered 2019-09-09 – 2019-09-10 (×5): 3.375 g via INTRAVENOUS
  Administered 2019-09-10: 3.375 mL via INTRAVENOUS
  Administered 2019-09-11 – 2019-09-15 (×14): 3.375 g via INTRAVENOUS
  Filled 2019-09-09 (×19): qty 50

## 2019-09-09 MED ORDER — FENTANYL CITRATE (PF) 100 MCG/2ML IJ SOLN
25.0000 ug | Freq: Once | INTRAMUSCULAR | Status: AC
  Administered 2019-09-09: 25 ug via INTRAVENOUS
  Filled 2019-09-09: qty 2

## 2019-09-09 MED ORDER — PYRIDOSTIGMINE BROMIDE 60 MG PO TABS
60.0000 mg | ORAL_TABLET | Freq: Three times a day (TID) | ORAL | Status: DC
  Administered 2019-09-09 – 2019-09-17 (×24): 60 mg via ORAL
  Filled 2019-09-09 (×26): qty 1

## 2019-09-09 MED ORDER — RAMIPRIL 5 MG PO CAPS: 5.0000 mg | ORAL_CAPSULE | Freq: Every day | ORAL | Status: DC

## 2019-09-09 MED ORDER — MYCOPHENOLATE MOFETIL 250 MG PO CAPS
250.0000 mg | ORAL_CAPSULE | Freq: Two times a day (BID) | ORAL | Status: DC
  Administered 2019-09-09 (×2): 250 mg via ORAL
  Filled 2019-09-09 (×3): qty 1

## 2019-09-09 MED ORDER — CHLORHEXIDINE GLUCONATE CLOTH 2 % EX PADS
6.0000 | MEDICATED_PAD | Freq: Once | CUTANEOUS | Status: AC
  Administered 2019-09-10: 6 via TOPICAL

## 2019-09-09 MED ORDER — DIGOXIN 0.25 MG/ML IJ SOLN
0.2500 mg | Freq: Once | INTRAMUSCULAR | Status: AC
  Administered 2019-09-09: 0.25 mg via INTRAVENOUS
  Filled 2019-09-09: qty 1

## 2019-09-09 MED ORDER — PYRIDOSTIGMINE BROMIDE 60 MG PO TABS
60.0000 mg | ORAL_TABLET | Freq: Two times a day (BID) | ORAL | Status: DC
  Administered 2019-09-09: 60 mg via ORAL
  Filled 2019-09-09: qty 1

## 2019-09-09 MED ORDER — ACETAMINOPHEN 650 MG RE SUPP: 650.0000 mg | Freq: Four times a day (QID) | RECTAL | Status: DC | PRN

## 2019-09-09 NOTE — Consult Note (Addendum)
Neurology Consultation  Reason for Consult: Need for possible steroids with patient who has myasthenia gravis Referring Physician: Cathren Harsh, MD  CC: Increased weakness and fatigue  History is obtained from: Patient  HPI: [name] is a 74 y.o. male with history of myasthenia gravis, hyperlipidemia, hypertension, atrial fibrillation who is now in the hospital secondary to appendicitis.  Patient sees Dr. Terrace Arabia for his myasthenia gravis.  In April 2018 he is taking CellCept 500 mg twice a day and Mestinon 60 mg one half tab twice a day as needed.  Later in October 2018 patient was taking CellCept 500 mg twice a day and Mestinon was DC'd.  On February 25, 2018 he was having double vision especially when he was getting tired or reading along with generalized fatigue.  At that time he was started on Mestinon 60 mg 3 times daily.  He was taking it at 6 AM, 12 PM and then 6 PM.  Due to patient feeling well he took himself off CellCept in March 2021.  Per notes from [facility] he noticed no change.  However in June 2021 he was noticing more fatigue.  At that time he was restarted on CellCept 250 mg twice daily and Mestinon 60 mg 3 times daily as needed.  Patient states that now he is taking Mestinon 60 mg at 6 AM and then at 6 PM however over the last couple days he has only been taking the Mestinon once a day secondary to the pancreatitis causing nausea and pain.  He does feel more fatigued over the last couple weeks.  He states that he feels that he is slightly short of breath but that is due to him wearing the mask.  Currently he can count to 20 with a single breath.   Past Medical History:  Diagnosis Date  . A-fib (HCC)   . GERD (gastroesophageal reflux disease)   . History of Bell's palsy   . History of pericarditis   . Hypertension   . Mixed hyperlipidemia   . Myasthenia gravis (HCC)    currently in remission (11/2014)     Family History  Problem Relation Age of Onset  . Cancer  Mother   . Heart disease Father   . Cancer Brother        bladder/ in remission  . Healthy Daughter        age 26   Social History:   reports that he quit smoking about 52 years ago. His smoking use included pipe. He has never used smokeless tobacco. He reports current alcohol use of about 1.0 standard drink of alcohol per week. He reports that he does not use drugs.  Medications  Current Facility-Administered Medications:  .  0.9 %  sodium chloride infusion, , Intravenous, Continuous, John Giovanni, MD, Last Rate: 125 mL/hr at 09/09/19 0523, New Bag at 09/09/19 0523 .  acetaminophen (TYLENOL) tablet 650 mg, 650 mg, Oral, Q6H PRN **OR** acetaminophen (TYLENOL) suppository 650 mg, 650 mg, Rectal, Q6H PRN, John Giovanni, MD .  atorvastatin (LIPITOR) tablet 20 mg, 20 mg, Oral, Daily, John Giovanni, MD, 20 mg at 09/09/19 0955 .  [START ON 09/10/2019] digoxin (LANOXIN) tablet 0.25 mg, 0.25 mg, Oral, Daily, Rai, Ripudeep K, MD .  heparin injection 5,000 Units, 5,000 Units, Subcutaneous, Q8H, John Giovanni, MD, 5,000 Units at 09/09/19 0525 .  morphine 2 MG/ML injection 2 mg, 2 mg, Intravenous, Q4H PRN, John Giovanni, MD, 2 mg at 09/09/19 0810 .  mycophenolate (CELLCEPT) capsule 250  mg, 250 mg, Oral, BID, John Giovanniathore, Vasundhra, MD, 250 mg at 09/09/19 0955 .  piperacillin-tazobactam (ZOSYN) IVPB 3.375 g, 3.375 g, Intravenous, Q8H, Poindexter, Leann T, RPH, Last Rate: 12.5 mL/hr at 09/09/19 0524, 3.375 g at 09/09/19 0524 .  pyridostigmine (MESTINON) tablet 60 mg, 60 mg, Oral, BID, Rai, Ripudeep K, MD, 60 mg at 09/09/19 0813 .  tamsulosin (FLOMAX) capsule 0.4 mg, 0.4 mg, Oral, Daily, John Giovanniathore, Vasundhra, MD, 0.4 mg at 09/09/19 0954 .  triamterene-hydrochlorothiazide (MAXZIDE-25) 37.5-25 MG per tablet 1.5 tablet, 1.5 tablet, Oral, Daily, John Giovanniathore, Vasundhra, MD, 1.5 tablet at 09/09/19 [id number]  Current Outpatient Medications:  .  acetaminophen (TYLENOL) 500 MG tablet, Take 500-1,000 mg  by mouth every 6 (six) hours as needed for mild pain., Disp: , Rfl:  .  Alpha-Lipoic Acid 300 MG CAPS, Take 300 mg by mouth daily. , Disp: , Rfl:  .  apixaban (ELIQUIS) 5 MG TABS tablet, Take 1 tablet (5 mg total) by mouth 2 (two) times daily., Disp: 60 tablet, Rfl: 5 .  atorvastatin (LIPITOR) 20 MG tablet, TAKE 1 TABLET BY MOUTH DAILY (Patient taking differently: Take 20 mg by mouth daily. ), Disp: 30 tablet, Rfl: 11 .  cephALEXin (KEFLEX) 500 MG capsule, Take 500 mg by mouth 2 (two) times daily., Disp: , Rfl:  .  Coenzyme Q10 (COQ-10) 100 MG CAPS, Take 100 mg by mouth daily. , Disp: , Rfl:  .  digoxin (LANOXIN) 0.25 MG tablet, Take 1 tablet (0.25 mg total) by mouth daily., Disp: 90 tablet, Rfl: 2 .  Multiple Vitamins-Minerals (CENTRUM SILVER PO), Take 1 tablet by mouth daily., Disp: , Rfl:  .  mycophenolate (CELLCEPT) 250 MG capsule, Take 1 capsule (250 mg total) by mouth 2 (two) times daily., Disp: 60 capsule, Rfl: 11 .  Omega-3 Fatty Acids (OMEGA-3 PO), Take 1,040 mg by mouth daily., Disp: , Rfl:  .  pyridostigmine (MESTINON) 60 MG tablet, Take 1 tablet (60 mg total) by mouth 3 (three) times daily as needed. (Patient taking differently: Take 60 mg by mouth 3 (three) times daily. ), Disp: 90 tablet, Rfl: 11 .  ramipril (ALTACE) 5 MG capsule, Take 5 mg by mouth daily., Disp: , Rfl:  .  Resveratrol 100 MG CAPS, Take 100 mg by mouth 2 (two) times daily. , Disp: , Rfl:  .  tamsulosin (FLOMAX) 0.4 MG CAPS capsule, Take 0.4 mg by mouth daily., Disp: , Rfl:  .  triamterene-hydrochlorothiazide (MAXZIDE-25) 37.5-25 MG tablet, Take 1.5 tablets by mouth daily. , Disp: , Rfl:  .  TURMERIC PO, Take 100 mg by mouth 2 (two) times daily., Disp: , Rfl:  .  potassium chloride (KLOR-CON) 10 MEQ tablet, Take 1 tablet (10 mEq total) by mouth daily. (Patient not taking: Reported on 09/08/2019), Disp: 30 tablet, Rfl: 0  ROS General ROS: Positive for -  fatigue Psychological ROS: negative for - behavioral disorder,  hallucinations, memory difficulties, mood swings or suicidal ideation Ophthalmic ROS: negative for - blurry vision, double vision, eye pain or loss of vision ENT ROS: negative for - epistaxis, nasal discharge, oral lesions, sore throat, tinnitus or vertigo Allergy and Immunology ROS: negative for - hives or itchy/watery eyes Hematological and Lymphatic ROS: negative for - bleeding problems, bruising or swollen lymph nodes Endocrine ROS: negative for - galactorrhea, hair pattern changes, polydipsia/polyuria or temperature intolerance Respiratory ROS: negative for - cough, hemoptysis, shortness of breath or wheezing Cardiovascular ROS: Positive irregular heartbeat Gastrointestinal ROS: negative for - abdominal pain, diarrhea, hematemesis, nausea/vomiting or stool  incontinence Genito-Urinary ROS: negative for - dysuria, hematuria, incontinence or urinary frequency/urgency Musculoskeletal ROS: negative for - joint swelling or muscular weakness Neurological ROS: as noted in HPI Dermatological ROS: negative for rash and skin lesion changes  Exam: Current vital signs: BP (!) 141/102 (BP Location: Right Arm)   Pulse 84   Temp 98.7 F (37.1 C) (Oral)   Resp (!) 24   Ht 5\' 7"  (1.702 m)   Wt 89.4 kg   SpO2 99%   BMI 30.85 kg/m  Vital signs in last 24 hours: Temp:  [98.4 F (36.9 C)-99.6 F (37.6 C)] 98.7 F (37.1 C) (07/28 1010) Pulse Rate:  [84-119] 84 (07/28 1010) Resp:  [15-25] 24 (07/28 1010) BP: (114-159)/(80-111) 141/102 (07/28 1010) SpO2:  [93 %-99 %] 99 % (07/28 1010) Weight:  [89.4 kg] 89.4 kg (07/27 2010)   Constitutional: Appears well-developed and well-nourished.  Psych: Affect appropriate to situation Eyes: No scleral injection HENT: No OP obstrucion Head: Normocephalic.  Cardiovascular: Normal rate and regular rhythm.  Respiratory: Effort normal, non-labored breathing GI: Soft.  No distension. There is no tenderness.  Skin: WDI  Neuro: Mental Status: Patient is  awake, alert, oriented to person, place, month, year, and situation.  Speech is clear, no dysarthria or aphasia.  He is able to name, repeat and has good comprehension.  Patient is able to follow commands.  Patient is a good historian.  Cranial Nerves: II: Visual Fields are full.  No double vision III,IV, VI: EOMI without ptosis or diploplia. Pupils equal, round and reactive to light.  No lid lag with prolonged vertical gaze V: Facial sensation is symmetric to temperature VII: Facial movement is symmetric.  VIII: hearing is intact to voice X: Palat elevates symmetrically XI: Shoulder shrug is symmetric. XII: tongue is midline without atrophy or fasciculations.  Motor: Tone is normal. Bulk is normal. 5/5 strength was present in all four extremities.  No weakness with fist pump. Sensory: Sensation is symmetric to light touch and temperature in the arms and legs. Deep Tendon Reflexes: 2+ and symmetric in the biceps and patellae.  Plantars: Toes are downgoing bilaterally.  Cerebellar: FNF with no dysmetria  Labs I have reviewed labs in epic and the results pertinent to this consultation are:   CBC    Component Value Date/Time   WBC 15.7 (H) 09/09/2019 0513   RBC 4.73 09/09/2019 0513   HGB 15.4 09/09/2019 0513   HGB 15.6 08/05/2019 1311   HCT 47.0 09/09/2019 0513   HCT 46.3 08/05/2019 1311   PLT 128 (L) 09/09/2019 0513   PLT 161 08/05/2019 1311   MCV 99.4 09/09/2019 0513   MCV 94 08/05/2019 1311   MCH 32.6 09/09/2019 0513   MCHC 32.8 09/09/2019 0513   RDW 13.5 09/09/2019 0513   RDW 12.6 08/05/2019 1311   LYMPHSABS 0.4 (L) 09/08/2019 2224   LYMPHSABS 1.1 08/05/2019 1311   MONOABS 0.5 09/08/2019 2224   EOSABS 0.0 09/08/2019 2224   EOSABS 0.2 08/05/2019 1311   BASOSABS 0.0 09/08/2019 2224   BASOSABS 0.1 08/05/2019 1311    CMP     Component Value Date/Time   NA 138 09/08/2019 2233   NA 141 08/05/2019 1311   K 3.7 09/08/2019 2233   CL 97 (L) 09/08/2019 2233   CO2 26  09/08/2019 2224   GLUCOSE 108 (H) 09/08/2019 2233   BUN 19 09/08/2019 2233   BUN 25 08/05/2019 1311   CREATININE 0.80 09/08/2019 2233   CREATININE 0.65 (L) 01/13/2015 1035  CALCIUM 8.9 09/08/2019 2224   PROT 6.8 09/08/2019 2224   PROT 6.4 08/05/2019 1311   ALBUMIN 3.6 09/08/2019 2224   ALBUMIN 4.3 08/05/2019 1311   AST 25 09/08/2019 2224   ALT 23 09/08/2019 2224   ALKPHOS 62 09/08/2019 2224   BILITOT 1.7 (H) 09/09/2019 0513   BILITOT 0.5 08/05/2019 1311   GFRNONAA >60 09/08/2019 2224   GFRAA >60 09/08/2019 2224    Lipid Panel     Component Value Date/Time   CHOL 144 09/13/2015 1041   CHOL 218 (H) 12/14/2014 0915   TRIG 60 09/13/2015 1041   TRIG 77 12/14/2014 0915   HDL 56 09/13/2015 1041   HDL 52 12/14/2014 0915   CHOLHDL 2.6 09/13/2015 1041   VLDL 12 09/13/2015 1041   LDLCALC 76 09/13/2015 1041   LDLCALC 151 (H) 12/14/2014 0915     Imaging No imaging of head  Felicie Morn PA-C [facility] [phone]  M-F  (9:00 am- 5:00 PM)  09/09/2019, 10:52 AM   I have seen the patient reviewed the above note.  Assessment:  This is a 74 year old male who has known myasthenia gravis who presented to the hospital secondary to appendicitis.  From a myasthenia standpoint, he actually seems fairly stable.  His symptoms of slightly worsened in the setting of not taking his Mestinon because of nausea, but with reintroduction of this I think that he will improve.  If surgery is indicated for his appendicitis, then I would proceed with this, if he develops significant worsening after, then can consider salvage therapies but would not do this preemptively.  I would consider using sugammadex to reverse any neuromuscular blockade after anesthesia.  Impression: -Myasthenia gravis however not in a exacerbation  Recommendations: -hold cellcept while active infection is being addressed.  -Start Mestinon 60 mg 3 times a day which would be 6 AM, 12 PM and 6 PM. -NIF and  vital capacities every 12 hours - avoid non-depolarizing paralytics if possible. Consider sugammadex to reverse neuromuscular blockade, but defer to anesthesia/surgery.  Ritta Slot, MD [facility] [phone]  If 7pm- 7am, please page neurology on call as listed in AMION.     Marland KitchenAs a rule, the listed drugs should be avoided whenever possible, and patients with MG should be followed closely when any new drug is introduced.   Drugs that may exacerbate MG   Antibiotics   Aminoglycosides: e.g., streptomycin, tobramycin, kanamycin   Quinolones: e.g., ciprofloxacin, levofloxacin, ofloxacin, gatifloxacin   Macrolides: e.g., erythromycin, azithromycin, telithromycin   Nondepolarizing muscle relaxants for surgery   d-Tubocurarine (curare), pancuronium, vecuronium, atracurium   Beta-blocking agents   Propranolol, atenolol, metoprolol   Local anesthetics and related agents   Procaine, Xylocaine in large amounts   Procainamide (for arrhythmias)   Botulinum toxin   Botox exacerbates weakness.   Quinine derivatives   Quinine, quinidine, chloroquine, mefloquine (Lariam)   Magnesium   Decreases ACh release   Penicillamine   May cause MG   Drugs with important interactions in MG   Cyclosporine   Broad range of drug interactions, which may raise or lower cyclosporine levels   Azathioprine   Avoid allopurinol; combination may result in myelosuppression.

## 2019-09-09 NOTE — Progress Notes (Signed)
[facility] Progress Note     Subjective: CC-  Continues to have some LLQ pain this morning, fairly well controlled with pain medication. Denies n/v.  WBC 15.7, afebrile.  Patient states that he is supposed to have an appointment with urologist tomorrow. He tells me that he did not undergo general anesthesia for that recent urologic procedure.  Objective: Vital signs in last 24 hours: Temp:  [98.4 F (36.9 C)-99.6 F (37.6 C)] 99.2 F (37.3 C) (07/28 0527) Pulse Rate:  [93-119] 97 (07/28 0940) Resp:  [15-25] 15 (07/28 0940) BP: (114-159)/(80-111) 126/97 (07/28 0940) SpO2:  [93 %-98 %] 98 % (07/28 0940) Weight:  [89.4 kg] 89.4 kg (07/27 2010)    Intake/Output from previous day: 07/27 0701 - 07/28 0700 In: 1050 [IV Piggyback:1050] Out: -  Intake/Output this shift: No intake/output data recorded.  PE: Gen:  Alert, NAD, pleasant HEENT: EOM's intact, pupils equal and round Card:  Irregularly irregular, tachycardic, 2+ DP pulses Pulm:  CTAB, no W/R/R, mild tachypnea  Abd: Soft, protuberant, +BS, no HSM, moderate TTP LLQ without rebound or guarding, no peritonitis Ext:  no BUE/BLE edema, calves soft and nontender Psych: A&Ox4  Skin: no rashes noted, warm and dry  Lab Results:  Recent Labs    09/08/19 2224 09/08/19 2224 09/08/19 2233 09/09/19 0513  WBC 15.8*  --   --  15.7*  HGB 15.9   < > 16.3 15.4  HCT 47.4   < > 48.0 47.0  PLT 143*  --   --  128*   < > = values in this interval not displayed.   BMET Recent Labs    09/08/19 2224 09/08/19 2233  NA 136 138  K 3.9 3.7  CL 97* 97*  CO2 26  --   GLUCOSE 112* 108*  BUN 17 19  CREATININE 0.64 0.80  CALCIUM 8.9  --    PT/INR No results for input(s): LABPROT, INR in the last 72 hours. CMP     Component Value Date/Time   NA 138 09/08/2019 2233   NA 141 08/05/2019 1311   K 3.7 09/08/2019 2233   CL 97 (L) 09/08/2019 2233   CO2 26 09/08/2019 2224   GLUCOSE 108 (H) 09/08/2019 2233   BUN 19  09/08/2019 2233   BUN 25 08/05/2019 1311   CREATININE 0.80 09/08/2019 2233   CREATININE 0.65 (L) 01/13/2015 1035   CALCIUM 8.9 09/08/2019 2224   PROT 6.8 09/08/2019 2224   PROT 6.4 08/05/2019 1311   ALBUMIN 3.6 09/08/2019 2224   ALBUMIN 4.3 08/05/2019 1311   AST 25 09/08/2019 2224   ALT 23 09/08/2019 2224   ALKPHOS 62 09/08/2019 2224   BILITOT 1.7 (H) 09/09/2019 0513   BILITOT 0.5 08/05/2019 1311   GFRNONAA >60 09/08/2019 2224   GFRAA >60 09/08/2019 2224   Lipase     Component Value Date/Time   LIPASE 26 09/08/2019 2224       Studies/Results: CT ABDOMEN PELVIS WO CONTRAST  Result Date: 09/08/2019 CLINICAL DATA:  Fever and abdominal distension, history of recent prostate procedure EXAM: CT ABDOMEN AND PELVIS WITHOUT CONTRAST TECHNIQUE: Multidetector CT imaging of the abdomen and pelvis was performed following the standard protocol without IV contrast. COMPARISON:  05/17/2019 FINDINGS: Lower chest: Lung bases are clear. Previously seen nodular density in the right lower lobe represent some focal eventration of the diaphragm. No other focal abnormality is noted. Hepatobiliary: No focal liver abnormality is seen. No gallstones, gallbladder wall thickening, or biliary dilatation. Pancreas: Unremarkable.  No pancreatic ductal dilatation or surrounding inflammatory changes. Spleen: Normal in size without focal abnormality. Adrenals/Urinary Tract: Adrenal glands demonstrate nodularity stable from the prior exam. No renal calculi are seen. Hypodensity in the left mid kidney is noted consistent with cyst. The bladder is decompressed by Foley catheter. Stomach/Bowel: Non rotation of the bowel is noted with the cecum in the left lower quadrant and the majority of the small bowel on the right. The appendix is visualized but small. Mild dilatation is seen with mild inflammatory changes suspicious for acute appendicitis. Correlate with laboratory values. No other focal bowel abnormality is noted.  Vascular/Lymphatic: Aortic atherosclerosis. No enlarged abdominal or pelvic lymph nodes. Reproductive: Prostate is enlarged. Other: No abdominal wall hernia or abnormality. No abdominopelvic ascites. Musculoskeletal: Degenerative changes of lumbar spine are noted. No acute abnormality is seen. IMPRESSION: Changes suspicious for acute appendicitis in the left lower quadrant due to non rotation of the bowel. These changes have progressed in the interval from the prior exam. The appendix measures approximately 14 mm in greatest dimension. No abscess is seen. Chronic changes stable from the previous exam. Electronically Signed   By: Alcide Clever M.D.   On: 09/08/2019 21:58   DG CHEST PORT 1 VIEW  Result Date: 09/09/2019 CLINICAL DATA:  Increased shortness of breath this morning EXAM: PORTABLE CHEST 1 VIEW COMPARISON:  09/20/2016 FINDINGS: No cardiomegaly. Borderline vascular congestion. No edema, consolidation, effusion, or pneumothorax. IMPRESSION: Borderline vascular congestion. Electronically Signed   By: Marnee Spring M.D.   On: 09/09/2019 07:27    Anti-infectives: Anti-infectives (From admission, onward)   Start     Dose/Rate Route Frequency Ordered Stop   09/09/19 0600  piperacillin-tazobactam (ZOSYN) IVPB 3.375 g     Discontinue     3.375 g 12.5 mL/hr over 240 Minutes Intravenous Every 8 hours 09/09/19 0142     09/08/19 2245  piperacillin-tazobactam (ZOSYN) IVPB 3.375 g        3.375 g 100 mL/hr over 30 Minutes Intravenous  Once 09/08/19 2233 09/09/19 0032       Assessment/Plan Malrotation Myasthenia gravis - followed by Dr. Terrace Arabia of [facility] Neurology Chronic Atrial fibrillation on Eliquis (1 dose 7/27 otherwise last dose was last 7/24) - cardiologist Dr. Verdis Prime BPH s/p BPH procedure 7/26 Dr. Mena Goes with indwelling Foley catheter HTN HLD Ascending aortic aneurysm  Acute appendicitis (LLQ) - CT 7/27 shows changes suspicious for acute appendicitis in the left lower quadrant due  to non rotation of the bowel, appendix measures approximately 14 mm, no abscess seen  ID - zosyn FEN - IVF, NPO VTE - SCDs, sq heparin Foley - in place per urology Follow up - TBD  Plan - Neurology and cardiology to see. Continue IV zosyn for now. Ok for clear liquids today from our standpoint, NPO after midnight. After specialist consults will discuss plan for operative vs nonoperative management with MD.    LOS: 1 day    Franne Forts, [facility] 09/09/2019, 9:49 AM Please see Amion for pager number during day hours 7:00am-4:30pm

## 2019-09-09 NOTE — ED Notes (Signed)
ED TO INPATIENT HANDOFF REPORT  Name/Age/Gender [name] 74 y.o. male  Code Status    Code Status Orders  (From admission, onward)         Start     Ordered   09/09/19 0138  Full code  Continuous        09/09/19 0138        Code Status History    Date Active Date Inactive Code Status Order ID Comments User Context   09/20/2016 1931 09/21/2016 2114 Full Code [id number]  Russella Dar, NP Inpatient   09/20/2016 1500 09/20/2016 1931 Full Code [id number]  Marlon Pel, PA-C ED   Advance Care Planning Activity      Home/SNF/Other Home  Chief Complaint Acute appendicitis [K35.80]  Level of Care/Admitting Diagnosis ED Disposition    ED Disposition Condition Comment   Admit  Hospital Area: [facility] [[id number]] Level of Care: Telemetry [5] Admit to tele based on following criteria: Complex arrhythmia (Bradycardia/Tachycardia) May admit patient to [facility] or [facility] if equivalent level of care is a vailable:: Yes Covid Evaluation: COVID negative Diagnosis: Acute appendicitis [782956] Admitting Physician: John Giovanni [[id number]] Attending Physician: John Giovanni [[id number]] Estimated length of stay: past midnight tomorrow Certificatio n:: I certify this patient will need inpatient services for at least 2 midnights       Medical History Past Medical History:  Diagnosis Date  . A-fib (HCC)   . GERD (gastroesophageal reflux disease)   . History of Bell's palsy   . History of pericarditis   . Hypertension   . Mixed hyperlipidemia   . Myasthenia gravis (HCC)    currently in remission (11/2014)     Allergies Allergies  Allergen Reactions  . Demerol [Meperidine]     hallucinations  . Aminoglycosides     Can not use due to mysthenia gravis  . Beta Adrenergic Blockers     Can not use due to mysthenia gravis  . Calcium Channel Blockers     Can not use due to mysthenia gravis  . Ciprofloxacin     Can not use due to  mysthenia gravis  . Iodinated Diagnostic Agents     Can not use due to mysthenia gravis  . Penicillamine     Can not use due to mysthenia gravis  . Procainamide Hcl     Can not use due to mysthenia gravis  . Quinine Derivatives     Can not use due to mysthenia gravis  . Succinylcholine Chloride     Can not use due to mysthenia gravis  . Vecuronium Bromide [Vecuronium]     Can not use due to mysthenia gravis    IV Location/Drains/Wounds Patient Lines/Drains/Airways Status    Active Line/Drains/Airways    Name Placement date Placement time Site Days   Peripheral IV 09/08/19 Right Antecubital 09/08/19  2232  Antecubital  1          Labs/Imaging Results for orders placed or performed during the hospital encounter of 09/08/19 (from the past 48 hour(s))  Urinalysis, Routine w reflex microscopic- may I&O cath if menses     Status: Abnormal   Collection Time: 09/08/19  8:22 PM  Result Value Ref Range   Color, Urine YELLOW YELLOW   APPearance HAZY (A) CLEAR   Specific Gravity, Urine 1.025 1.005 - 1.030   pH 6.0 5.0 - 8.0   Glucose, UA NEGATIVE NEGATIVE mg/dL   Hgb urine dipstick LARGE (A) NEGATIVE   Bilirubin Urine SMALL (  A) NEGATIVE   Ketones, ur 40 (A) NEGATIVE mg/dL   Protein, ur >194 (A) NEGATIVE mg/dL   Nitrite NEGATIVE NEGATIVE   Leukocytes,Ua TRACE (A) NEGATIVE    Comment: Performed at [facility], [address], [place], [place] [address]  Urine C&S     Status: None (Preliminary result)   Collection Time: 09/08/19  8:22 PM   Specimen: Urine, Catheterized  Result Value Ref Range   Specimen Description      URINE, CATHETERIZED Performed at [facility], [address], [place], [place] [address]    Special Requests      NONE Performed at [facility], [address] [place], [place] [address]    Culture PENDING    Report Status PENDING   Urinalysis, Microscopic (reflex)     Status: None   Collection Time: 09/08/19   8:22 PM  Result Value Ref Range   RBC / HPF >50 0 - 5 RBC/hpf   WBC, UA 0-5 0 - 5 WBC/hpf   Bacteria, UA NONE SEEN NONE SEEN   Squamous Epithelial / LPF NONE SEEN 0 - 5    Comment: Performed at [facility], [address], [place], [place] [address]  CBC with Differential/Platelet     Status: Abnormal   Collection Time: 09/08/19 10:24 PM  Result Value Ref Range   WBC 15.8 (H) 4.0 - 10.5 K/uL   RBC 4.88 4.22 - 5.81 MIL/uL   Hemoglobin 15.9 13.0 - 17.0 g/dL   HCT 63.7 39 - 52 %   MCV 97.1 80.0 - 100.0 fL   MCH 32.6 26.0 - 34.0 pg   MCHC 33.5 30.0 - 36.0 g/dL   RDW 85.8 85.0 - 27.7 %   Platelets 143 (L) 150 - 400 K/uL   nRBC 0.0 0.0 - 0.2 %   Neutrophils Relative % 93 %   Neutro Abs 14.8 (H) 1.7 - 7.7 K/uL   Lymphocytes Relative 3 %   Lymphs Abs 0.4 (L) 0.7 - 4.0 K/uL   Monocytes Relative 3 %   Monocytes Absolute 0.5 0 - 1 K/uL   Eosinophils Relative 0 %   Eosinophils Absolute 0.0 0 - 0 K/uL   Basophils Relative 0 %   Basophils Absolute 0.0 0 - 0 K/uL   Immature Granulocytes 1 %   Abs Immature Granulocytes 0.11 (H) 0.00 - 0.07 K/uL    Comment: Performed at [facility], [address] [place], [place] [address]  Comprehensive metabolic panel     Status: Abnormal   Collection Time: 09/08/19 10:24 PM  Result Value Ref Range   Sodium 136 135 - 145 mmol/L   Potassium 3.9 3.5 - 5.1 mmol/L   Chloride 97 (L) 98 - 111 mmol/L   CO2 26 22 - 32 mmol/L   Glucose, Bld 112 (H) 70 - 99 mg/dL    Comment: Glucose reference range applies only to samples taken after fasting for at least 8 hours.   BUN 17 8 - 23 mg/dL   Creatinine, Ser 8.67 0.61 - 1.24 mg/dL   Calcium 8.9 8.9 - 67.2 mg/dL   Total Protein 6.8 6.5 - 8.1 g/dL   Albumin 3.6 3.5 - 5.0 g/dL   AST 25 15 - 41 U/L   ALT 23 0 - 44 U/L   Alkaline Phosphatase 62 38 - 126 U/L   Total Bilirubin 1.9 (H) 0.3 - 1.2 mg/dL   GFR calc non Af Amer >60 >60  mL/min   GFR calc Af Amer >60 >60 mL/min    Anion gap 13 5 - 15    Comment: Performed at [facility], [address], [place], [place] [address]  Lipase, blood     Status: None   Collection Time: 09/08/19 10:24 PM  Result Value Ref Range   Lipase 26 11 - 51 U/L    Comment: Performed at [facility], [address] [place], [place] [address]  I-stat chem 8, ED (not at [facility] or [facility])     Status: Abnormal   Collection Time: 09/08/19 10:33 PM  Result Value Ref Range   Sodium 138 135 - 145 mmol/L   Potassium 3.7 3.5 - 5.1 mmol/L   Chloride 97 (L) 98 - 111 mmol/L   BUN 19 8 - 23 mg/dL   Creatinine, Ser 1.28 0.61 - 1.24 mg/dL   Glucose, Bld 786 (H) 70 - 99 mg/dL    Comment: Glucose reference range applies only to samples taken after fasting for at least 8 hours.   Calcium, Ion 1.11 (L) 1.15 - 1.40 mmol/L   TCO2 28 22 - 32 mmol/L   Hemoglobin 16.3 13.0 - 17.0 g/dL   HCT 76.7 39 - 52 %  SARS Coronavirus 2 by RT PCR (hospital order, performed in [facility] hospital lab) Nasopharyngeal Nasopharyngeal Swab     Status: None   Collection Time: 09/08/19 11:30 PM   Specimen: Nasopharyngeal Swab  Result Value Ref Range   SARS Coronavirus 2 NEGATIVE NEGATIVE    Comment: (NOTE) SARS-CoV-2 target nucleic acids are NOT DETECTED.  The SARS-CoV-2 RNA is generally detectable in upper and lower respiratory specimens during the acute phase of infection. The lowest concentration of SARS-CoV-2 viral copies this assay can detect is 250 copies / mL. A negative result does not preclude SARS-CoV-2 infection and should not be used as the sole basis for treatment or other patient management decisions.  A negative result may occur with improper specimen collection / handling, submission of specimen other than nasopharyngeal swab, presence of viral mutation(s) within the areas targeted by this assay, and inadequate number of viral copies (<250 copies / mL). A negative result must be combined with  clinical observations, patient history, and epidemiological information.  Fact Sheet for Patients:   BoilerBrush.com.cy  Fact Sheet for Healthcare Providers: https://pope.com/  This test is not yet approved or  cleared by the [place] FDA and has been authorized for detection and/or diagnosis of SARS-CoV-2 by FDA under an Emergency Use Authorization (EUA).  This EUA will remain in effect (meaning this test can be used) for the duration of the COVID-19 declaration under Section 564(b)(1) of the Act, 21 U.S.C. section 360bbb-3(b)(1), unless the authorization is terminated or revoked sooner.  Performed at [facility], [address], [place], [place] [address]   Lactic acid, plasma     Status: None   Collection Time: 09/09/19  1:51 AM  Result Value Ref Range   Lactic Acid, Venous 1.0 0.5 - 1.9 mmol/L    Comment: Performed at [facility], [address], [place], [place] [address]  Bilirubin, fractionated(tot/dir/indir)     Status: Abnormal   Collection Time: 09/09/19  5:13 AM  Result Value Ref Range   Total Bilirubin 1.7 (H) 0.3 - 1.2 mg/dL   Bilirubin, Direct 0.4 (H) 0.0 - 0.2 mg/dL   Indirect Bilirubin 1.3 (H) 0.3 - 0.9 mg/dL    Comment: Performed at [facility], [address] [place], [place] [address]  CBC     Status: Abnormal   Collection Time: 09/09/19  5:13 AM  Result Value Ref Range   WBC 15.7 (H) 4.0 - 10.5 K/uL   RBC 4.73 4.22 - 5.81 MIL/uL   Hemoglobin 15.4 13.0 - 17.0 g/dL   HCT 60.447.0 39 - 52 %   MCV 99.4 80.0 - 100.0 fL   MCH 32.6 26.0 - 34.0 pg   MCHC 32.8 30.0 - 36.0 g/dL   RDW 54.013.5 98.111.5 - 19.115.5 %   Platelets 128 (L) 150 - 400 K/uL   nRBC 0.0 0.0 - 0.2 %    Comment: Performed at [facility], [address] [place], [place] [address]  Brain natriuretic peptide     Status: Abnormal   Collection Time: 09/09/19 11:44 AM  Result  Value Ref Range   B Natriuretic Peptide 114.1 (H) 0.0 - 100.0 pg/mL    Comment: Performed at [facility], [address] [place], [place] [address]   CT ABDOMEN PELVIS WO CONTRAST  Result Date: 09/08/2019 CLINICAL DATA:  Fever and abdominal distension, history of recent prostate procedure EXAM: CT ABDOMEN AND PELVIS WITHOUT CONTRAST TECHNIQUE: Multidetector CT imaging of the abdomen and pelvis was performed following the standard protocol without IV contrast. COMPARISON:  05/17/2019 FINDINGS: Lower chest: Lung bases are clear. Previously seen nodular density in the right lower lobe represent some focal eventration of the diaphragm. No other focal abnormality is noted. Hepatobiliary: No focal liver abnormality is seen. No gallstones, gallbladder wall thickening, or biliary dilatation. Pancreas: Unremarkable. No pancreatic ductal dilatation or surrounding inflammatory changes. Spleen: Normal in size without focal abnormality. Adrenals/Urinary Tract: Adrenal glands demonstrate nodularity stable from the prior exam. No renal calculi are seen. Hypodensity in the left mid kidney is noted consistent with cyst. The bladder is decompressed by Foley catheter. Stomach/Bowel: Non rotation of the bowel is noted with the cecum in the left lower quadrant and the majority of the small bowel on the right. The appendix is visualized but small. Mild dilatation is seen with mild inflammatory changes suspicious for acute appendicitis. Correlate with laboratory values. No other focal bowel abnormality is noted. Vascular/Lymphatic: Aortic atherosclerosis. No enlarged abdominal or pelvic lymph nodes. Reproductive: Prostate is enlarged. Other: No abdominal wall hernia or abnormality. No abdominopelvic ascites. Musculoskeletal: Degenerative changes of lumbar spine are noted. No acute abnormality is seen. IMPRESSION: Changes suspicious for acute appendicitis in the left lower quadrant due to non rotation of the  bowel. These changes have progressed in the interval from the prior exam. The appendix measures approximately 14 mm in greatest dimension. No abscess is seen. Chronic changes stable from the previous exam. Electronically Signed   By: Alcide CleverMark  Lukens M.D.   On: 09/08/2019 21:58   DG CHEST PORT 1 VIEW  Result Date: 09/09/2019 CLINICAL DATA:  Increased shortness of breath this morning EXAM: PORTABLE CHEST 1 VIEW COMPARISON:  09/20/2016 FINDINGS: No cardiomegaly. Borderline vascular congestion. No edema, consolidation, effusion, or pneumothorax. IMPRESSION: Borderline vascular congestion. Electronically Signed   By: Marnee SpringJonathon  Watts M.D.   On: 09/09/2019 07:27    Pending Labs [employer] [facility] (From admission, onward) Comment          Start     Ordered   Signed and Held  CBC WITH DIFFERENTIAL  Tomorrow morning,   R        Signed and Held   Signed and Held  Basic metabolic panel  Tomorrow morning,   R        Signed and Held          Vitals/Pain Today's Vitals   09/09/19 1500 09/09/19 1532 09/09/19 1614 09/09/19 1636  BP: (!) 136/86 (!) 142/103 (!) 138/93   Pulse: 95 94 102   Resp: 20 (!) 24 21   Temp:      TempSrc:      SpO2: 98% 98% 98%   Weight:      Height:      PainSc:    6     Isolation Precautions No active isolations  Medications Medications  morphine 2 MG/ML injection 2 mg (2 mg Intravenous Given 09/09/19 1631)  atorvastatin (LIPITOR) tablet 20 mg (20 mg Oral Given 09/09/19 0955)  triamterene-hydrochlorothiazide (MAXZIDE-25) 37.5-25 MG per tablet 1.5 tablet (1.5 tablets Oral Given 09/09/19 0955)  tamsulosin (FLOMAX) capsule 0.4 mg (0.4 mg Oral Given 09/09/19 0954)  mycophenolate (CELLCEPT) capsule 250 mg (250 mg Oral Given 09/09/19 0955)  heparin injection 5,000 Units (5,000 Units Subcutaneous Given 09/09/19 1412)  acetaminophen (TYLENOL) tablet 650 mg (has no administration in time range)    Or  acetaminophen (TYLENOL) suppository 650 mg (has no administration in time range)   piperacillin-tazobactam (ZOSYN) IVPB 3.375 g (3.375 g Intravenous New Bag/Given 09/09/19 1412)  digoxin (LANOXIN) tablet 0.25 mg (has no administration in time range)  0.9 %  sodium chloride infusion ( Intravenous New Bag/Given 09/09/19 1144)  pyridostigmine (MESTINON) tablet 60 mg (60 mg Oral Given 09/09/19 1413)  sodium chloride 0.9 % bolus 1,000 mL (0 mLs Intravenous Stopped 09/09/19 0001)  morphine 4 MG/ML injection 4 mg (4 mg Intravenous Given 09/08/19 2233)  piperacillin-tazobactam (ZOSYN) IVPB 3.375 g (0 g Intravenous Stopped 09/09/19 0032)  digoxin (LANOXIN) 0.25 MG/ML injection 0.25 mg (0.25 mg Intravenous Given 09/09/19 0811)    Mobility walks

## 2019-09-09 NOTE — Progress Notes (Signed)
Pharmacy Antibiotic Note  [name] is a 74 y.o. male admitted on 09/08/2019 with appendicitis.  Pharmacy has been consulted for Zosyn dosing.  Plan: Zosyn 3.375g IV q8h (4 hour infusion).  Need for further dosage adjustment appears unlikely at present.    Will sign off at this time.  Please reconsult if a change in clinical status warrants re-evaluation of dosage.    Height: 5\' 7"  (170.2 cm) Weight: 89.4 kg (197 lb) IBW/kg (Calculated) : 66.1  Temp (24hrs), Avg:99 F (37.2 C), Min:98.4 F (36.9 C), Max:99.6 F (37.6 C)  Recent Labs  Lab 09/08/19 2224 09/08/19 2233  WBC 15.8*  --   CREATININE 0.64 0.80    Estimated Creatinine Clearance: 85.1 mL/min (by C-G formula based on SCr of 0.8 mg/dL).    Allergies  Allergen Reactions  . Demerol [Meperidine]     hallucinations  . Aminoglycosides     Can not use due to mysthenia gravis  . Beta Adrenergic Blockers     Can not use due to mysthenia gravis  . Calcium Channel Blockers     Can not use due to mysthenia gravis  . Ciprofloxacin     Can not use due to mysthenia gravis  . Iodinated Diagnostic Agents     Can not use due to mysthenia gravis  . Penicillamine     Can not use due to mysthenia gravis  . Procainamide Hcl     Can not use due to mysthenia gravis  . Quinine Derivatives     Can not use due to mysthenia gravis  . Succinylcholine Chloride     Can not use due to mysthenia gravis  . Vecuronium Bromide [Vecuronium]     Can not use due to mysthenia gravis     Thank you for allowing pharmacy to be a part of this patient's care.  2234, PharmD 09/09/2019 1:43 AM

## 2019-09-09 NOTE — Progress Notes (Signed)
[facility]                                                                              Patient Demographics  [name], is a 74 y.o. male, DOB - 06-20-1945, SWH:[id number]  Admit date - 09/08/2019   Admitting Physician John Giovanni, MD  Outpatient Primary MD for the patient is [name], MD  Outpatient specialists:   LOS - 1  days   Medical records reviewed and are as summarized below:    Chief Complaint  Patient presents with  . Post-op Problem       Brief summary   Patient is a 74 year old male with a history of congenital intestinal malrotation, A. fib on Eliquis, myasthenia gravis on CellCept, hypertension, hyperlipidemia, GERD presented to ED with fever, abdominal pain and distention.  Patient reported that he had a urological procedure done on Monday, 7/27 at [facility] urology and Foley catheter was placed.  He was advised to come to ED if he spiked fevers or had abdominal pain or distention.  Patient presented to ED with all the symptoms and abdominal pain, LLQ 8/10 with nausea.  Patient reported that his neurologist, Dr Terrace Arabia tried tapering down the dose of his CellCept 6 months ago but he became symptomatic so the dose has to be increased again.  Since then he has been stable.  No new weakness, double vision, or difficulty with speech/swallowing.  He has been coughing a little for the past few days.  Showed acute appendicitis in the   Assessment & Plan    Principal Problem: Sepsis secondary to acute appendicitis, LLQ -Confirmed on CT with history of congenital intestinal malrotation.  Presented with tachycardia, fever, leukocytosis, source likely due to acute appendicitis.  Lactic acid normal -Gentle IV fluids, IV Zosyn, pain control -Per surgery okay for clears today, n.p.o. after midnight -Patient has been seen by general surgery, at risk for myasthenic or cholinergic crisis with general anesthesia. -I have requested neurology  consult, discussed with Dr. Amada Jupiter, will see patient for further recommendations  Active Problems: Chronic atrial fibrillation with RVR -At the time of my examination, A. fib with RVR with a heart rate in 130s. -Patient takes digoxin for A. Fib, gave 0.25 mg IV digoxin x1, resume oral outpatient dose from tomorrow -Continue to hold Eliquis -Complaining of shortness of breath, possibly due to pain, A. fib with RVR, volume overload -Chest x-ray showed borderline vascular congestion, obtain BNP, decrease IV fluid rate -Appreciate cardiology recommendation, had a recent nuclear stress test, echo 03/2018 had shown EF of 60 to 65%     Myasthenia gravis (HCC) -Continue CellCept, patient reports that he has missed Mestinon for 3 days.  Typically takes it 60 mg as needed since CellCept was started and he had been doing well. -Resume Mestinon 60 mg twice daily for now, will await neurology recommendation -Patient has been having cough after eating or sips of water in the evenings for last 4 to 5 days -Hold ramipril, n.p.o.    Hypertension -Continue triamterene HCTZ   BPH -Per patient, had urological procedure on 7/27,, urology, Dr. Mena Goes - Foley is supposed to come  out on 7/29   Obesity Estimated body mass index is 30.85 kg/m as calculated from the following:   Height as of this encounter: 5\' 7"  (1.702 m).   Weight as of this encounter: 89.4 kg.  Code Status: Full code DVT Prophylaxis: Heparin subcu Family Communication: Discussed all imaging results, lab results, explained to the patient    Disposition Plan:     Status is: Inpatient  Remains inpatient appropriate because:Inpatient level of care appropriate due to severity of illness   Dispo: The patient is from: [place]              Anticipated d/c is to: [place]              Anticipated d/c date is: 2 days              Patient currently is not medically stable to d/c.      Time Spent in minutes   35 minutes  Procedures:   None  Consultants:   General surgery Neurology  Antimicrobials:   Anti-infectives (From admission, onward)   Start     Dose/Rate Route Frequency Ordered Stop   09/09/19 0600  piperacillin-tazobactam (ZOSYN) IVPB 3.375 g     Discontinue     3.375 g 12.5 mL/hr over 240 Minutes Intravenous Every 8 hours 09/09/19 0142     09/08/19 2245  piperacillin-tazobactam (ZOSYN) IVPB 3.375 g        3.375 g 100 mL/hr over 30 Minutes Intravenous  Once 09/08/19 2233 09/09/19 0032          Medications  Scheduled Meds: . atorvastatin  20 mg Oral Daily  . [START ON 09/10/2019] digoxin  0.25 mg Oral Daily  . heparin  5,000 Units Subcutaneous Q8H  . mycophenolate  250 mg Oral BID  . pyridostigmine  60 mg Oral BID  . tamsulosin  0.4 mg Oral Daily  . triamterene-hydrochlorothiazide  1.5 tablet Oral Daily   Continuous Infusions: . sodium chloride 125 mL/hr at 09/09/19 0523  . piperacillin-tazobactam (ZOSYN)  IV 3.375 g (09/09/19 0524)   PRN Meds:.acetaminophen **OR** acetaminophen, morphine injection      Subjective:   [name] was seen and examined today.  At the time of my examination, tachycardia with RVR, heart rate in 120s to 130s.  Having difficulty taking a deep breath.  No chest pain.  Pain in left lower quadrant controlled with pain medication, 3/10.  No active nausea or vomiting.  No fevers.  Objective:   Vitals:   09/09/19 0801 09/09/19 0847 09/09/19 0940 09/09/19 1010  BP: (!) 152/111 (!) 159/81 (!) 126/97 (!) 141/102  Pulse: (!) 108 98 97 84  Resp: 20 (!) 24 15 (!) 24  Temp:    98.7 F (37.1 C)  TempSrc:    Oral  SpO2: 97% 98% 98% 99%  Weight:      Height:        Intake/Output Summary (Last 24 hours) at 09/09/2019 1048 Last data filed at 09/09/2019 0032 Gross per 24 hour  Intake 1050 ml  Output --  Net 1050 ml     Wt Readings from Last 3 Encounters:  09/08/19 89.4 kg  09/03/19 89.7 kg  08/05/19 88.5 kg     Exam  General: Alert and oriented x  3, NAD  Cardiovascular: Clearly irregular, tachycardia  Respiratory: Decreased breath sound at the bases  Gastrointestinal: Soft, + BS, moderate LLQ TTP, no peritoneal signs  Ext: no pedal edema bilaterally  Neuro: no new deficits  Musculoskeletal: No digital cyanosis, clubbing  Skin: No rashes  Psych: Normal affect and demeanor, alert and oriented x3    Data Reviewed:  I have personally reviewed following labs and imaging studies  Micro Results Recent Results (from the past 240 hour(s))  Urine C&S     Status: None (Preliminary result)   Collection Time: 09/08/19  8:22 PM   Specimen: Urine, Catheterized  Result Value Ref Range Status   Specimen Description   Final    URINE, CATHETERIZED Performed at [facility], [address], [place], [place] [address]    Special Requests   Final    NONE Performed at [facility], [address] [place], [place] [address]    Culture PENDING  Incomplete   Report Status PENDING  Incomplete  SARS Coronavirus 2 by RT PCR (hospital order, performed in [facility] hospital lab) Nasopharyngeal Nasopharyngeal Swab     Status: None   Collection Time: 09/08/19 11:30 PM   Specimen: Nasopharyngeal Swab  Result Value Ref Range Status   SARS Coronavirus 2 NEGATIVE NEGATIVE Final    Comment: (NOTE) SARS-CoV-2 target nucleic acids are NOT DETECTED.  The SARS-CoV-2 RNA is generally detectable in upper and lower respiratory specimens during the acute phase of infection. The lowest concentration of SARS-CoV-2 viral copies this assay can detect is 250 copies / mL. A negative result does not preclude SARS-CoV-2 infection and should not be used as the sole basis for treatment or other patient management decisions.  A negative result may occur with improper specimen collection / handling, submission of specimen other than nasopharyngeal swab, presence of viral mutation(s) within the areas targeted by this assay, and  inadequate number of viral copies (<250 copies / mL). A negative result must be combined with clinical observations, patient history, and epidemiological information.  Fact Sheet for Patients:   BoilerBrush.com.cy  Fact Sheet for Healthcare Providers: https://pope.com/  This test is not yet approved or  cleared by the [place] FDA and has been authorized for detection and/or diagnosis of SARS-CoV-2 by FDA under an Emergency Use Authorization (EUA).  This EUA will remain in effect (meaning this test can be used) for the duration of the COVID-19 declaration under Section 564(b)(1) of the Act, 21 U.S.C. section 360bbb-3(b)(1), unless the authorization is terminated or revoked sooner.  Performed at [facility], [address] [place], [place] [address]     Radiology Reports CT ABDOMEN PELVIS WO CONTRAST  Result Date: 09/08/2019 CLINICAL DATA:  Fever and abdominal distension, history of recent prostate procedure EXAM: CT ABDOMEN AND PELVIS WITHOUT CONTRAST TECHNIQUE: Multidetector CT imaging of the abdomen and pelvis was performed following the standard protocol without IV contrast. COMPARISON:  05/17/2019 FINDINGS: Lower chest: Lung bases are clear. Previously seen nodular density in the right lower lobe represent some focal eventration of the diaphragm. No other focal abnormality is noted. Hepatobiliary: No focal liver abnormality is seen. No gallstones, gallbladder wall thickening, or biliary dilatation. Pancreas: Unremarkable. No pancreatic ductal dilatation or surrounding inflammatory changes. Spleen: Normal in size without focal abnormality. Adrenals/Urinary Tract: Adrenal glands demonstrate nodularity stable from the prior exam. No renal calculi are seen. Hypodensity in the left mid kidney is noted consistent with cyst. The bladder is decompressed by Foley catheter. Stomach/Bowel: Non rotation of the bowel is noted  with the cecum in the left lower quadrant and the majority of the small bowel on the right. The appendix is visualized but small. Mild dilatation is  seen with mild inflammatory changes suspicious for acute appendicitis. Correlate with laboratory values. No other focal bowel abnormality is noted. Vascular/Lymphatic: Aortic atherosclerosis. No enlarged abdominal or pelvic lymph nodes. Reproductive: Prostate is enlarged. Other: No abdominal wall hernia or abnormality. No abdominopelvic ascites. Musculoskeletal: Degenerative changes of lumbar spine are noted. No acute abnormality is seen. IMPRESSION: Changes suspicious for acute appendicitis in the left lower quadrant due to non rotation of the bowel. These changes have progressed in the interval from the prior exam. The appendix measures approximately 14 mm in greatest dimension. No abscess is seen. Chronic changes stable from the previous exam. Electronically Signed   By: Alcide CleverMark  Lukens M.D.   On: 09/08/2019 21:58   DG CHEST PORT 1 VIEW  Result Date: 09/09/2019 CLINICAL DATA:  Increased shortness of breath this morning EXAM: PORTABLE CHEST 1 VIEW COMPARISON:  09/20/2016 FINDINGS: No cardiomegaly. Borderline vascular congestion. No edema, consolidation, effusion, or pneumothorax. IMPRESSION: Borderline vascular congestion. Electronically Signed   By: Marnee SpringJonathon  Watts M.D.   On: 09/09/2019 07:27    Lab Data:  CBC: Recent Labs  Lab 09/08/19 2224 09/08/19 2233 09/09/19 0513  WBC 15.8*  --  15.7*  NEUTROABS 14.8*  --   --   HGB 15.9 16.3 15.4  HCT 47.4 48.0 47.0  MCV 97.1  --  99.4  PLT 143*  --  128*   Basic Metabolic Panel: Recent Labs  Lab 09/08/19 2224 09/08/19 2233  NA 136 138  K 3.9 3.7  CL 97* 97*  CO2 26  --   GLUCOSE 112* 108*  BUN 17 19  CREATININE 0.64 0.80  CALCIUM 8.9  --    GFR: Estimated Creatinine Clearance: 85.1 mL/min (by C-G formula based on SCr of 0.8 mg/dL). Liver Function Tests: Recent Labs  Lab 09/08/19 2224  09/09/19 0513  AST 25  --   ALT 23  --   ALKPHOS 62  --   BILITOT 1.9* 1.7*  PROT 6.8  --   ALBUMIN 3.6  --    Recent Labs  Lab 09/08/19 2224  LIPASE 26   No results for input(s): AMMONIA in the last 168 hours. Coagulation Profile: No results for input(s): INR, PROTIME in the last 168 hours. Cardiac Enzymes: No results for input(s): CKTOTAL, CKMB, CKMBINDEX, TROPONINI in the last 168 hours. BNP (last 3 results) No results for input(s): PROBNP in the last 8760 hours. HbA1C: No results for input(s): HGBA1C in the last 72 hours. CBG: No results for input(s): GLUCAP in the last 168 hours. Lipid Profile: No results for input(s): CHOL, HDL, LDLCALC, TRIG, CHOLHDL, LDLDIRECT in the last 72 hours. Thyroid Function Tests: No results for input(s): TSH, T4TOTAL, FREET4, T3FREE, THYROIDAB in the last 72 hours. Anemia Panel: No results for input(s): VITAMINB12, FOLATE, FERRITIN, TIBC, IRON, RETICCTPCT in the last 72 hours. Urine analysis:    Component Value Date/Time   COLORURINE YELLOW 09/08/2019 2022   APPEARANCEUR HAZY (A) 09/08/2019 2022   LABSPEC 1.025 09/08/2019 2022   PHURINE 6.0 09/08/2019 2022   GLUCOSEU NEGATIVE 09/08/2019 2022   HGBUR LARGE (A) 09/08/2019 2022   BILIRUBINUR SMALL (A) 09/08/2019 2022   KETONESUR 40 (A) 09/08/2019 2022   PROTEINUR >300 (A) 09/08/2019 2022   UROBILINOGEN 0.2 07/05/2007 2323   NITRITE NEGATIVE 09/08/2019 2022   LEUKOCYTESUR TRACE (A) 09/08/2019 2022     Glendene Wyer M.D. [facility] 09/09/2019, 10:48 AM   Call night coverage person covering after 7pm

## 2019-09-09 NOTE — Progress Notes (Signed)
RT Note: NIF/FVC obtained ,both with good effort:NIF: -30 cmh20/FVC: 1.42L Both done with good effort, RN at bedside.

## 2019-09-09 NOTE — ED Notes (Signed)
Patient states he has been feeling short of breath, this nurse placed O2 on patient for comfort. Oxygen saturation maintaining over 95%. Hospitalist made aware.

## 2019-09-09 NOTE — H&P (View-Only) (Signed)
[facility] Progress Note     Subjective: CC-  Continues to have some LLQ pain this morning, fairly well controlled with pain medication. Denies n/v.  WBC 15.7, afebrile.  Patient states that he is supposed to have an appointment with urologist tomorrow. He tells me that he did not undergo general anesthesia for that recent urologic procedure.  Objective: Vital signs in last 24 hours: Temp:  [98.4 F (36.9 C)-99.6 F (37.6 C)] 99.2 F (37.3 C) (07/28 0527) Pulse Rate:  [93-119] 97 (07/28 0940) Resp:  [15-25] 15 (07/28 0940) BP: (114-159)/(80-111) 126/97 (07/28 0940) SpO2:  [93 %-98 %] 98 % (07/28 0940) Weight:  [89.4 kg] 89.4 kg (07/27 2010)    Intake/Output from previous day: 07/27 0701 - 07/28 0700 In: 1050 [IV Piggyback:1050] Out: -  Intake/Output this shift: No intake/output data recorded.  PE: Gen:  Alert, NAD, pleasant HEENT: EOM's intact, pupils equal and round Card:  Irregularly irregular, tachycardic, 2+ DP pulses Pulm:  CTAB, no W/R/R, mild tachypnea  Abd: Soft, protuberant, +BS, no HSM, moderate TTP LLQ without rebound or guarding, no peritonitis Ext:  no BUE/BLE edema, calves soft and nontender Psych: A&Ox4  Skin: no rashes noted, warm and dry  Lab Results:  Recent Labs    09/08/19 2224 09/08/19 2224 09/08/19 2233 09/09/19 0513  WBC 15.8*  --   --  15.7*  HGB 15.9   < > 16.3 15.4  HCT 47.4   < > 48.0 47.0  PLT 143*  --   --  128*   < > = values in this interval not displayed.   BMET Recent Labs    09/08/19 2224 09/08/19 2233  NA 136 138  K 3.9 3.7  CL 97* 97*  CO2 26  --   GLUCOSE 112* 108*  BUN 17 19  CREATININE 0.64 0.80  CALCIUM 8.9  --    PT/INR No results for input(s): LABPROT, INR in the last 72 hours. CMP     Component Value Date/Time   NA 138 09/08/2019 2233   NA 141 08/05/2019 1311   K 3.7 09/08/2019 2233   CL 97 (L) 09/08/2019 2233   CO2 26 09/08/2019 2224   GLUCOSE 108 (H) 09/08/2019 2233   BUN 19  09/08/2019 2233   BUN 25 08/05/2019 1311   CREATININE 0.80 09/08/2019 2233   CREATININE 0.65 (L) 01/13/2015 1035   CALCIUM 8.9 09/08/2019 2224   PROT 6.8 09/08/2019 2224   PROT 6.4 08/05/2019 1311   ALBUMIN 3.6 09/08/2019 2224   ALBUMIN 4.3 08/05/2019 1311   AST 25 09/08/2019 2224   ALT 23 09/08/2019 2224   ALKPHOS 62 09/08/2019 2224   BILITOT 1.7 (H) 09/09/2019 0513   BILITOT 0.5 08/05/2019 1311   GFRNONAA >60 09/08/2019 2224   GFRAA >60 09/08/2019 2224   Lipase     Component Value Date/Time   LIPASE 26 09/08/2019 2224       Studies/Results: CT ABDOMEN PELVIS WO CONTRAST  Result Date: 09/08/2019 CLINICAL DATA:  Fever and abdominal distension, history of recent prostate procedure EXAM: CT ABDOMEN AND PELVIS WITHOUT CONTRAST TECHNIQUE: Multidetector CT imaging of the abdomen and pelvis was performed following the standard protocol without IV contrast. COMPARISON:  05/17/2019 FINDINGS: Lower chest: Lung bases are clear. Previously seen nodular density in the right lower lobe represent some focal eventration of the diaphragm. No other focal abnormality is noted. Hepatobiliary: No focal liver abnormality is seen. No gallstones, gallbladder wall thickening, or biliary dilatation. Pancreas: Unremarkable.  No pancreatic ductal dilatation or surrounding inflammatory changes. Spleen: Normal in size without focal abnormality. Adrenals/Urinary Tract: Adrenal glands demonstrate nodularity stable from the prior exam. No renal calculi are seen. Hypodensity in the left mid kidney is noted consistent with cyst. The bladder is decompressed by Foley catheter. Stomach/Bowel: Non rotation of the bowel is noted with the cecum in the left lower quadrant and the majority of the small bowel on the right. The appendix is visualized but small. Mild dilatation is seen with mild inflammatory changes suspicious for acute appendicitis. Correlate with laboratory values. No other focal bowel abnormality is noted.  Vascular/Lymphatic: Aortic atherosclerosis. No enlarged abdominal or pelvic lymph nodes. Reproductive: Prostate is enlarged. Other: No abdominal wall hernia or abnormality. No abdominopelvic ascites. Musculoskeletal: Degenerative changes of lumbar spine are noted. No acute abnormality is seen. IMPRESSION: Changes suspicious for acute appendicitis in the left lower quadrant due to non rotation of the bowel. These changes have progressed in the interval from the prior exam. The appendix measures approximately 14 mm in greatest dimension. No abscess is seen. Chronic changes stable from the previous exam. Electronically Signed   By: Mark  Lukens M.D.   On: 09/08/2019 21:58   DG CHEST PORT 1 VIEW  Result Date: 09/09/2019 CLINICAL DATA:  Increased shortness of breath this morning EXAM: PORTABLE CHEST 1 VIEW COMPARISON:  09/20/2016 FINDINGS: No cardiomegaly. Borderline vascular congestion. No edema, consolidation, effusion, or pneumothorax. IMPRESSION: Borderline vascular congestion. Electronically Signed   By: Jonathon  Watts M.D.   On: 09/09/2019 07:27    Anti-infectives: Anti-infectives (From admission, onward)   Start     Dose/Rate Route Frequency Ordered Stop   09/09/19 0600  piperacillin-tazobactam (ZOSYN) IVPB 3.375 g     Discontinue     3.375 g 12.5 mL/hr over 240 Minutes Intravenous Every 8 hours 09/09/19 0142     09/08/19 2245  piperacillin-tazobactam (ZOSYN) IVPB 3.375 g        3.375 g 100 mL/hr over 30 Minutes Intravenous  Once 09/08/19 2233 09/09/19 0032       Assessment/Plan Malrotation Myasthenia gravis - followed by Dr. Yan of [facility] Chronic Atrial fibrillation on Eliquis (1 dose 7/27 otherwise last dose was last 7/24) - cardiologist Dr. Henry Smith BPH s/p BPH procedure 7/26 Dr. Eskridge with indwelling Foley catheter HTN HLD Ascending aortic aneurysm  Acute appendicitis (LLQ) - CT 7/27 shows changes suspicious for acute appendicitis in the left lower quadrant due  to non rotation of the bowel, appendix measures approximately 14 mm, no abscess seen  ID - zosyn FEN - IVF, NPO VTE - SCDs, sq heparin Foley - in place per urology Follow up - TBD  Plan - Neurology and cardiology to see. Continue IV zosyn for now. Ok for clear liquids today from our standpoint, NPO after midnight. After specialist consults will discuss plan for operative vs nonoperative management with MD.    LOS: 1 day    Lamira Borin A Hermenegildo Clausen, PA-C [facility] 09/09/2019, 9:49 AM Please see Amion for pager number during day hours 7:00am-4:30pm  

## 2019-09-09 NOTE — Progress Notes (Signed)
Reviewed notes from office visit with Dr. Katrinka Blazing (less than a week ago), recent nuclear stress test from November and echo from last year, as well as current notes, ECG and labs. He is at mildly increased risk for Cv complications with abdominal surgery due to his age, the presence of chronic AFib and the urgent nature of the procedure, but the benefits outweigh the risks. Anticoagulant effect will take 48 hours to completely resolve. Please restart anticoagulation as soon as safe after the surgery. Please call us back if full consultation is necessary.  Thurmon Fair, MD, [facility] [phone] office [phone] pager

## 2019-09-09 NOTE — H&P (Addendum)
History and Physical    [name] GYJ:[id number] DOB: April 03, 1945 DOA: 09/08/2019  PCP: [name], MD Patient coming from: Home  Chief Complaint: Fever, abdominal pain and distention  HPI: [name] is a 74 y.o. male with medical history significant of congenital intestinal malrotation, chronic A. fib on Eliquis, myasthenia gravis on CellCept, hypertension, hyperlipidemia, GERD presenting to the ED with complaints of fever, abdominal pain and distention.  Patient states he had a urologic procedure done yesterday at [facility] urology and a Foley catheter was placed.  He was advised to come into the ED if he spiked a fever or had abdominal pain or distention.  States he experienced all 3 symptoms today which led him to come into the ED.  He is having 8 out of 10 intensity left lower quadrant abdominal pain.  Feeling nauseous but has not vomited.  States his neurologist tried tapering down the dose of his CellCept 6 months ago but he became symptomatic so the dose has to be increased again.  Since then he has been stable.  No new weakness, double vision, or difficulty with speech/swallowing.  He has been coughing a little for the past few days.  Denies chest pain or shortness of breath.  He has already been vaccinated against Covid.  ED Course: Temperature 99.6 F.  Slightly tachycardic.  WBC count 15.8.  Lipase normal.  T bili 1.9, remainder of LFTs normal.  UA not suggestive of infection.  Urine culture pending.  SARS-CoV-2 PCR test negative.  CT abdomen pelvis with findings suggestive of acute appendicitis in the left lower quadrant due to nonrotation of the bowel.  No abscess seen.  Patient was given morphine, Zosyn, and 1 L normal saline bolus.  General surgery consulted.  Review of Systems:  All systems reviewed and apart from history of presenting illness, are negative.  Past Medical History:  Diagnosis Date  . A-fib (HCC)   . GERD (gastroesophageal reflux disease)    . History of Bell's palsy   . History of pericarditis   . Hypertension   . Mixed hyperlipidemia   . Myasthenia gravis (HCC)    currently in remission (11/2014)     Past Surgical History:  Procedure Laterality Date  . CARDIOVERSION N/A 09/21/2016   Procedure: CARDIOVERSION;  Surgeon: Thurmon Fair, MD;  Location: [facility] ENDOSCOPY;  Service: Cardiovascular;  Laterality: N/A;  . CARDIOVERSION N/A 10/17/2016   Procedure: CARDIOVERSION;  Surgeon: Laurey Morale, MD;  Location: [facility] ENDOSCOPY;  Service: Cardiovascular;  Laterality: N/A;  . CARDIOVERSION N/A 10/31/2016   Procedure: CARDIOVERSION;  Surgeon: Wendall Stade, MD;  Location: [facility] ENDOSCOPY;  Service: Cardiovascular;  Laterality: N/A;  . LAMINECTOMY  1991  . TEE WITHOUT CARDIOVERSION N/A 09/21/2016   Procedure: TRANSESOPHAGEAL ECHOCARDIOGRAM (TEE);  Surgeon: Thurmon Fair, MD;  Location: [facility] ENDOSCOPY;  Service: Cardiovascular;  Laterality: N/A;  . TEE WITHOUT CARDIOVERSION N/A 10/31/2016   Procedure: TRANSESOPHAGEAL ECHOCARDIOGRAM (TEE);  Surgeon: Wendall Stade, MD;  Location: [facility] ENDOSCOPY;  Service: Cardiovascular;  Laterality: N/A;  . TONSILLECTOMY  1950     reports that he quit smoking about 52 years ago. His smoking use included pipe. He has never used smokeless tobacco. He reports current alcohol use of about 1.0 standard drink of alcohol per week. He reports that he does not use drugs.  Allergies  Allergen Reactions  . Demerol [Meperidine]     hallucinations  . Aminoglycosides     Can not use due to mysthenia gravis  .  Beta Adrenergic Blockers     Can not use due to mysthenia gravis  . Calcium Channel Blockers     Can not use due to mysthenia gravis  . Ciprofloxacin     Can not use due to mysthenia gravis  . Iodinated Diagnostic Agents     Can not use due to mysthenia gravis  . Penicillamine     Can not use due to mysthenia gravis  . Procainamide Hcl     Can not use due to mysthenia gravis  . Quinine Derivatives      Can not use due to mysthenia gravis  . Succinylcholine Chloride     Can not use due to mysthenia gravis  . Vecuronium Bromide [Vecuronium]     Can not use due to mysthenia gravis    Family History  Problem Relation Age of Onset  . Cancer Mother   . Heart disease Father   . Cancer Brother        bladder/ in remission  . Healthy Daughter        age 36    Prior to Admission medications   Medication Sig Start Date End Date Taking? Authorizing Provider  acetaminophen (TYLENOL) 500 MG tablet Take 500-1,000 mg by mouth every 6 (six) hours as needed for mild pain.   Yes [provider]  Alpha-Lipoic Acid 300 MG CAPS Take 300 mg by mouth daily.    Yes [provider]  apixaban (ELIQUIS) 5 MG TABS tablet Take 1 tablet (5 mg total) by mouth 2 (two) times daily. 07/28/19  Yes Lyn Records, MD  atorvastatin (LIPITOR) 20 MG tablet TAKE 1 TABLET BY MOUTH DAILY Patient taking differently: Take 20 mg by mouth daily.  10/12/16  Yes Lyn Records, MD  cephALEXin (KEFLEX) 500 MG capsule Take 500 mg by mouth 2 (two) times daily. 09/04/19  Yes [provider]  Coenzyme Q10 (COQ-10) 100 MG CAPS Take 100 mg by mouth daily.    Yes [provider]  digoxin (LANOXIN) 0.25 MG tablet Take 1 tablet (0.25 mg total) by mouth daily. 06/01/19  Yes Lyn Records, MD  Multiple Vitamins-Minerals (CENTRUM SILVER PO) Take 1 tablet by mouth daily.   Yes [provider]  mycophenolate (CELLCEPT) 250 MG capsule Take 1 capsule (250 mg total) by mouth 2 (two) times daily. 08/05/19  Yes Glean Salvo, NP  Omega-3 Fatty Acids (OMEGA-3 PO) Take 1,040 mg by mouth daily.   Yes [provider]  pyridostigmine (MESTINON) 60 MG tablet Take 1 tablet (60 mg total) by mouth 3 (three) times daily as needed. Patient taking differently: Take 60 mg by mouth 3 (three) times daily.  08/05/19  Yes Glean Salvo, NP  ramipril (ALTACE) 5 MG capsule Take 5 mg by mouth daily. 11/05/14  Yes  [provider]  Resveratrol 100 MG CAPS Take 100 mg by mouth 2 (two) times daily.    Yes [provider]  tamsulosin (FLOMAX) 0.4 MG CAPS capsule Take 0.4 mg by mouth daily. 09/29/14  Yes [provider]  triamterene-hydrochlorothiazide (MAXZIDE-25) 37.5-25 MG tablet Take 1.5 tablets by mouth daily.    Yes [provider]  TURMERIC PO Take 100 mg by mouth 2 (two) times daily.   Yes [provider]  potassium chloride (KLOR-CON) 10 MEQ tablet Take 1 tablet (10 mEq total) by mouth daily. Patient not taking: Reported on 09/08/2019 05/18/19 09/03/19  Terald Sleeper, MD    Physical Exam: Vitals:  09/08/19 2010 09/09/19 0000 09/09/19 0001 09/09/19 0045  BP: 114/84 (!) 131/107 (!) 137/96 (!) 133/85  Pulse: (!) 106 105 102 (!) 112  Resp: 17 21 20 18   Temp: 98.4 F (36.9 C)  99.6 F (37.6 C)   TempSrc: Oral  Oral   SpO2: 96% 96% 96% 95%  Weight: 89.4 kg     Height: 5\' 7"  (1.702 m)       Physical Exam Constitutional:      General: He is in acute distress.     Comments: Appears distressed secondary to pain  HENT:     Head: Normocephalic and atraumatic.     Mouth/Throat:     Mouth: Mucous membranes are moist.     Pharynx: Oropharynx is clear.  Eyes:     General:        Right eye: No discharge.        Left eye: No discharge.     Extraocular Movements: Extraocular movements intact.  Cardiovascular:     Rate and Rhythm: Tachycardia present. Rhythm irregular.     Pulses: Normal pulses.     Comments: Slightly tachycardic Pulmonary:     Effort: Pulmonary effort is normal. No respiratory distress.     Breath sounds: Normal breath sounds. No wheezing or rales.  Abdominal:     General: Bowel sounds are normal. There is distension.     Tenderness: There is abdominal tenderness. There is guarding. There is no rebound.     Comments: Left lower quadrant tender to palpation with guarding  Musculoskeletal:        General: No swelling or tenderness.      Cervical back: Normal range of motion and neck supple.  Skin:    General: Skin is warm and dry.  Neurological:     General: No focal deficit present.     Mental Status: He is alert and oriented to person, place, and time.     Labs on Admission: I have personally reviewed following labs and imaging studies  CBC: Recent Labs  Lab 09/08/19 2224 09/08/19 2233  WBC 15.8*  --   NEUTROABS 14.8*  --   HGB 15.9 16.3  HCT 47.4 48.0  MCV 97.1  --   PLT 143*  --    Basic Metabolic Panel: Recent Labs  Lab 09/08/19 2224 09/08/19 2233  NA 136 138  K 3.9 3.7  CL 97* 97*  CO2 26  --   GLUCOSE 112* 108*  BUN 17 19  CREATININE 0.64 0.80  CALCIUM 8.9  --    GFR: Estimated Creatinine Clearance: 85.1 mL/min (by C-G formula based on SCr of 0.8 mg/dL). Liver Function Tests: Recent Labs  Lab 09/08/19 2224  AST 25  ALT 23  ALKPHOS 62  BILITOT 1.9*  PROT 6.8  ALBUMIN 3.6   Recent Labs  Lab 09/08/19 2224  LIPASE 26   No results for input(s): AMMONIA in the last 168 hours. Coagulation Profile: No results for input(s): INR, PROTIME in the last 168 hours. Cardiac Enzymes: No results for input(s): CKTOTAL, CKMB, CKMBINDEX, TROPONINI in the last 168 hours. BNP (last 3 results) No results for input(s): PROBNP in the last 8760 hours. HbA1C: No results for input(s): HGBA1C in the last 72 hours. CBG: No results for input(s): GLUCAP in the last 168 hours. Lipid Profile: No results for input(s): CHOL, HDL, LDLCALC, TRIG, CHOLHDL, LDLDIRECT in the last 72 hours. Thyroid Function Tests: No results for input(s): TSH, T4TOTAL, FREET4, T3FREE, THYROIDAB in the last  72 hours. Anemia Panel: No results for input(s): VITAMINB12, FOLATE, FERRITIN, TIBC, IRON, RETICCTPCT in the last 72 hours. Urine analysis:    Component Value Date/Time   COLORURINE YELLOW 09/08/2019 2022   APPEARANCEUR HAZY (A) 09/08/2019 2022   LABSPEC 1.025 09/08/2019 2022   PHURINE 6.0 09/08/2019 2022   GLUCOSEU  NEGATIVE 09/08/2019 2022   HGBUR LARGE (A) 09/08/2019 2022   BILIRUBINUR SMALL (A) 09/08/2019 2022   KETONESUR 40 (A) 09/08/2019 2022   PROTEINUR >300 (A) 09/08/2019 2022   UROBILINOGEN 0.2 07/05/2007 2323   NITRITE NEGATIVE 09/08/2019 2022   LEUKOCYTESUR TRACE (A) 09/08/2019 2022    Radiological Exams on Admission: CT ABDOMEN PELVIS WO CONTRAST  Result Date: 09/08/2019 CLINICAL DATA:  Fever and abdominal distension, history of recent prostate procedure EXAM: CT ABDOMEN AND PELVIS WITHOUT CONTRAST TECHNIQUE: Multidetector CT imaging of the abdomen and pelvis was performed following the standard protocol without IV contrast. COMPARISON:  05/17/2019 FINDINGS: Lower chest: Lung bases are clear. Previously seen nodular density in the right lower lobe represent some focal eventration of the diaphragm. No other focal abnormality is noted. Hepatobiliary: No focal liver abnormality is seen. No gallstones, gallbladder wall thickening, or biliary dilatation. Pancreas: Unremarkable. No pancreatic ductal dilatation or surrounding inflammatory changes. Spleen: Normal in size without focal abnormality. Adrenals/Urinary Tract: Adrenal glands demonstrate nodularity stable from the prior exam. No renal calculi are seen. Hypodensity in the left mid kidney is noted consistent with cyst. The bladder is decompressed by Foley catheter. Stomach/Bowel: Non rotation of the bowel is noted with the cecum in the left lower quadrant and the majority of the small bowel on the right. The appendix is visualized but small. Mild dilatation is seen with mild inflammatory changes suspicious for acute appendicitis. Correlate with laboratory values. No other focal bowel abnormality is noted. Vascular/Lymphatic: Aortic atherosclerosis. No enlarged abdominal or pelvic lymph nodes. Reproductive: Prostate is enlarged. Other: No abdominal wall hernia or abnormality. No abdominopelvic ascites. Musculoskeletal: Degenerative changes of lumbar spine  are noted. No acute abnormality is seen. IMPRESSION: Changes suspicious for acute appendicitis in the left lower quadrant due to non rotation of the bowel. These changes have progressed in the interval from the prior exam. The appendix measures approximately 14 mm in greatest dimension. No abscess is seen. Chronic changes stable from the previous exam. Electronically Signed   By: Alcide Clever M.D.   On: 09/08/2019 21:58    EKG: Independently reviewed.  A. fib, heart rate 114.  Incomplete RBBB similar to prior tracings.  Assessment/Plan Principal Problem:   Acute appendicitis Active Problems:   Hyperlipidemia   Myasthenia gravis (HCC)   Hypertension   Chronic atrial fibrillation (HCC)   Sepsis secondary to acute appendicitis (LLQ): Seen on CT, history of congenital intestinal malrotation.  Slightly tachycardic.  Temperature 99.6 F.  Labs showing leukocytosis (WBC count 15.8). -Patient has been seen by general surgery and it is felt that he does not need urgent surgical intervention tonight.  He is at risk for myasthenic or cholinergic crisis with general anesthesia.  Neurology and anesthesiology consultation needed in the morning.  For now, plan is to give IV Zosyn and keep n.p.o. except sips with meds.  IV fluid hydration, morphine as needed for pain.  Continue to monitor WBC count.  Check lactic acid level.  Surgery recommending holding Eliquis but okay with chemical VTE prophylaxis.  Elevated T bili: T bili 1.9, remainder of LFTs normal. -Check fractionated bilirubin levels  Myasthenia gravis -Continue CellCept.  Consult  neurology in the morning to see if he needs stress dose steroids.  Chronic A. Fib: Rate currently in low 100s. -Hold Eliquis. Continue home digoxin.  Cough: Patient reports having a slight cough for the past few days.  Lungs clear on exam.  Not tachypneic or hypoxic.  Currently not smoking cigarettes.  Does take an ACE inhibitor at home which could possibly be  contributing. -Hold ramipril and continue to assess.  Obtain chest imaging if cough is persistent.  Addendum: Patient later complained of shortness of breath to nursing staff.  Not tachypneic or hypoxic.  Placed on supplemental oxygen for comfort.  Stat chest x-ray ordered and currently pending.  Hypertension: Currently normotensive. -Continue triamterene-hydrochlorothiazide.  Hold ramipril given cough.  Hyperlipidemia -Continue Lipitor  DVT prophylaxis: Subcutaneous heparin Code Status: Full code-discussed with the patient Family Communication: No family available at this time. Disposition Plan: Status is: Inpatient  Remains inpatient appropriate because:IV treatments appropriate due to intensity of illness or inability to take PO, Inpatient level of care appropriate due to severity of illness and Needs surgery for acute appendicitis   Dispo: The patient is from: Home              Anticipated d/c is to: Home              Anticipated d/c date is: > 3 days              Patient currently is not medically stable to d/c.  The medical decision making on this patient was of high complexity and the patient is at high risk for clinical deterioration, therefore this is a level 3 visit.  John Giovanni MD [facility]  If 7PM-7AM, please contact night-coverage [email or web address]  09/09/2019, 1:45 AM

## 2019-09-10 ENCOUNTER — Encounter (HOSPITAL_COMMUNITY): Payer: Self-pay | Admitting: Internal Medicine

## 2019-09-10 ENCOUNTER — Inpatient Hospital Stay (HOSPITAL_COMMUNITY): Payer: Medicare PPO | Admitting: Certified Registered Nurse Anesthetist

## 2019-09-10 ENCOUNTER — Encounter (HOSPITAL_COMMUNITY)
Admission: EM | Disposition: A | Discharge status: Home / Self Care | Payer: Self-pay | Source: Home / Self Care | Attending: Internal Medicine

## 2019-09-10 DIAGNOSIS — K352 Acute appendicitis with generalized peritonitis, without abscess: Secondary | ICD-10-CM

## 2019-09-10 HISTORY — PX: LAPAROSCOPIC APPENDECTOMY: SHX408

## 2019-09-10 LAB — BASIC METABOLIC PANEL
Anion gap: 9 (ref 5–15)
BUN: 18 mg/dL (ref 8–23)
CO2: 27 mmol/L (ref 22–32)
Calcium: 8.3 mg/dL — ABNORMAL LOW (ref 8.9–10.3)
Chloride: 101 mmol/L (ref 98–111)
Creatinine, Ser: 0.96 mg/dL (ref 0.61–1.24)
GFR calc Af Amer: >60 mL/min (ref >60)
GFR calc non Af Amer: >60 mL/min (ref >60)
Glucose, Bld: 84 mg/dL (ref 70–99)
Potassium: 3.8 mmol/L (ref 3.5–5.1)
Sodium: 137 mmol/L (ref 135–145)

## 2019-09-10 LAB — CBC WITH DIFFERENTIAL/PLATELET
Abs Immature Granulocytes: 0.08 10*3/uL — ABNORMAL HIGH (ref 0.00–0.07)
Basophils Absolute: 0 10*3/uL (ref 0.0–0.1)
Basophils Relative: 0 %
Eosinophils Absolute: 0.1 10*3/uL (ref 0.0–0.5)
Eosinophils Relative: 1 %
HCT: 45.2 % (ref 39.0–52.0)
Hemoglobin: 14.6 g/dL (ref 13.0–17.0)
Immature Granulocytes: 1 %
Lymphocytes Relative: 4 %
Lymphs Abs: 0.5 10*3/uL — ABNORMAL LOW (ref 0.7–4.0)
MCH: 32.5 pg (ref 26.0–34.0)
MCHC: 32.3 g/dL (ref 30.0–36.0)
MCV: 100.7 fL — ABNORMAL HIGH (ref 80.0–100.0)
Monocytes Absolute: 0.7 10*3/uL (ref 0.1–1.0)
Monocytes Relative: 6 %
Neutro Abs: 10.3 10*3/uL — ABNORMAL HIGH (ref 1.7–7.7)
Neutrophils Relative %: 88 %
Platelets: 137 10*3/uL — ABNORMAL LOW (ref 150–400)
RBC: 4.49 MIL/uL (ref 4.22–5.81)
RDW: 13.4 % (ref 11.5–15.5)
WBC: 11.7 10*3/uL — ABNORMAL HIGH (ref 4.0–10.5)
nRBC: 0 % (ref 0.0–0.2)

## 2019-09-10 LAB — GLUCOSE, CAPILLARY: Glucose-Capillary: 88 mg/dL (ref 70–99)

## 2019-09-10 LAB — SURGICAL PCR SCREEN
MRSA, PCR: NEGATIVE
Staphylococcus aureus: NEGATIVE

## 2019-09-10 LAB — URINE CULTURE: Culture: <10000 — AB

## 2019-09-10 SURGERY — APPENDECTOMY, LAPAROSCOPIC
Anesthesia: General

## 2019-09-10 MED ORDER — LACTATED RINGERS IV SOLN: INTRAVENOUS | Status: DC

## 2019-09-10 MED ORDER — 0.9 % SODIUM CHLORIDE (POUR BTL) OPTIME
TOPICAL | Status: DC | PRN
  Administered 2019-09-10: 1000 mL

## 2019-09-10 MED ORDER — ONDANSETRON HCL 4 MG/2ML IJ SOLN
INTRAMUSCULAR | Status: DC | PRN
  Administered 2019-09-10: 4 mg via INTRAVENOUS

## 2019-09-10 MED ORDER — ALBUMIN HUMAN 5 % IV SOLN
INTRAVENOUS | Status: AC
  Filled 2019-09-10: qty 250

## 2019-09-10 MED ORDER — MUPIROCIN 2 % EX OINT
1.0000 "application " | TOPICAL_OINTMENT | Freq: Two times a day (BID) | CUTANEOUS | Status: DC
  Administered 2019-09-10 (×2): 1 via NASAL
  Filled 2019-09-10 (×2): qty 22

## 2019-09-10 MED ORDER — ALBUMIN HUMAN 5 % IV SOLN: INTRAVENOUS | Status: DC | PRN

## 2019-09-10 MED ORDER — PHENYLEPHRINE 40 MCG/ML (10ML) SYRINGE FOR IV PUSH (FOR BLOOD PRESSURE SUPPORT)
PREFILLED_SYRINGE | INTRAVENOUS | Status: DC | PRN
  Administered 2019-09-10: 80 ug via INTRAVENOUS
  Administered 2019-09-10: 120 ug via INTRAVENOUS
  Administered 2019-09-10 (×2): 80 ug via INTRAVENOUS

## 2019-09-10 MED ORDER — FENTANYL CITRATE (PF) 100 MCG/2ML IJ SOLN
INTRAMUSCULAR | Status: DC | PRN
  Administered 2019-09-10: 50 ug via INTRAVENOUS
  Administered 2019-09-10: 100 ug via INTRAVENOUS
  Administered 2019-09-10 (×2): 50 ug via INTRAVENOUS

## 2019-09-10 MED ORDER — FENTANYL CITRATE (PF) 100 MCG/2ML IJ SOLN
INTRAMUSCULAR | Status: AC
  Filled 2019-09-10: qty 2

## 2019-09-10 MED ORDER — PROPOFOL 10 MG/ML IV BOLUS
INTRAVENOUS | Status: AC
  Filled 2019-09-10: qty 20

## 2019-09-10 MED ORDER — PHENYLEPHRINE HCL-NACL 10-0.9 MG/250ML-% IV SOLN
INTRAVENOUS | Status: DC | PRN
  Administered 2019-09-10: 50 ug/min via INTRAVENOUS

## 2019-09-10 MED ORDER — CHLORHEXIDINE GLUCONATE 0.12 % MT SOLN
15.0000 mL | Freq: Once | OROMUCOSAL | Status: AC
  Administered 2019-09-10: 15 mL via OROMUCOSAL

## 2019-09-10 MED ORDER — MORPHINE SULFATE (PF) 2 MG/ML IV SOLN
2.0000 mg | INTRAVENOUS | Status: DC | PRN
  Administered 2019-09-10 – 2019-09-14 (×7): 2 mg via INTRAVENOUS
  Filled 2019-09-10 (×9): qty 1

## 2019-09-10 MED ORDER — DEXMEDETOMIDINE HCL 200 MCG/2ML IV SOLN
INTRAVENOUS | Status: DC | PRN
  Administered 2019-09-10 (×5): 8 ug via INTRAVENOUS

## 2019-09-10 MED ORDER — ONDANSETRON HCL 4 MG/2ML IJ SOLN: 4.0000 mg | Freq: Once | INTRAMUSCULAR | Status: DC | PRN

## 2019-09-10 MED ORDER — FENTANYL CITRATE (PF) 100 MCG/2ML IJ SOLN: 25.0000 ug | INTRAMUSCULAR | Status: DC | PRN

## 2019-09-10 MED ORDER — ACETAMINOPHEN 10 MG/ML IV SOLN: 1000.0000 mg | Freq: Once | INTRAVENOUS | Status: DC | PRN

## 2019-09-10 MED ORDER — ROCURONIUM BROMIDE 10 MG/ML (PF) SYRINGE
PREFILLED_SYRINGE | INTRAVENOUS | Status: DC | PRN
  Administered 2019-09-10 (×2): 10 mg via INTRAVENOUS
  Administered 2019-09-10: 30 mg via INTRAVENOUS

## 2019-09-10 MED ORDER — OXYCODONE HCL 5 MG PO TABS
5.0000 mg | ORAL_TABLET | ORAL | Status: DC | PRN
  Administered 2019-09-10: 5 mg via ORAL
  Filled 2019-09-10: qty 1

## 2019-09-10 MED ORDER — LACTATED RINGERS IV SOLN
INTRAVENOUS | Status: AC | PRN
  Administered 2019-09-10: 1000 mL via INTRAVENOUS

## 2019-09-10 MED ORDER — ONDANSETRON HCL 4 MG/2ML IJ SOLN
INTRAMUSCULAR | Status: AC
  Filled 2019-09-10: qty 2

## 2019-09-10 MED ORDER — SUGAMMADEX SODIUM 200 MG/2ML IV SOLN
INTRAVENOUS | Status: DC | PRN
  Administered 2019-09-10: 200 mg via INTRAVENOUS

## 2019-09-10 MED ORDER — DEXAMETHASONE SODIUM PHOSPHATE 10 MG/ML IJ SOLN
INTRAMUSCULAR | Status: AC
  Filled 2019-09-10: qty 1

## 2019-09-10 MED ORDER — CHLORHEXIDINE GLUCONATE CLOTH 2 % EX PADS
6.0000 | MEDICATED_PAD | Freq: Every day | CUTANEOUS | Status: DC
  Administered 2019-09-11 – 2019-09-17 (×7): 6 via TOPICAL

## 2019-09-10 MED ORDER — TISSEEL VH 10 ML EX KIT
PACK | CUTANEOUS | Status: DC | PRN
  Administered 2019-09-10: 10 mL via TOPICAL

## 2019-09-10 MED ORDER — DEXAMETHASONE SODIUM PHOSPHATE 4 MG/ML IJ SOLN
INTRAMUSCULAR | Status: DC | PRN
  Administered 2019-09-10: 5 mg via INTRAVENOUS

## 2019-09-10 MED ORDER — BUPIVACAINE LIPOSOME 1.3 % IJ SUSP
20.0000 mL | Freq: Once | INTRAMUSCULAR | Status: AC
  Administered 2019-09-10: 20 mL
  Filled 2019-09-10: qty 20

## 2019-09-10 MED ORDER — PHENYLEPHRINE 40 MCG/ML (10ML) SYRINGE FOR IV PUSH (FOR BLOOD PRESSURE SUPPORT)
PREFILLED_SYRINGE | INTRAVENOUS | Status: AC
  Filled 2019-09-10: qty 10

## 2019-09-10 MED ORDER — PROPOFOL 10 MG/ML IV BOLUS
INTRAVENOUS | Status: DC | PRN
  Administered 2019-09-10: 150 mg via INTRAVENOUS

## 2019-09-10 MED ORDER — DEXMEDETOMIDINE HCL IN NACL 200 MCG/50ML IV SOLN
INTRAVENOUS | Status: AC
  Filled 2019-09-10: qty 50

## 2019-09-10 MED ORDER — LIDOCAINE 2% (20 MG/ML) 5 ML SYRINGE
INTRAMUSCULAR | Status: DC | PRN
  Administered 2019-09-10: 60 mg via INTRAVENOUS

## 2019-09-10 MED ORDER — TISSEEL VH 10 ML EX KIT
PACK | CUTANEOUS | Status: AC
  Filled 2019-09-10: qty 1

## 2019-09-10 MED ORDER — PHENYLEPHRINE HCL (PRESSORS) 10 MG/ML IV SOLN
INTRAVENOUS | Status: AC
  Filled 2019-09-10: qty 1

## 2019-09-10 MED ORDER — METHOCARBAMOL 1000 MG/10ML IJ SOLN
500.0000 mg | Freq: Four times a day (QID) | INTRAVENOUS | Status: DC | PRN
  Filled 2019-09-10: qty 5

## 2019-09-10 SURGICAL SUPPLY — 64 items
ADH SKN CLS APL DERMABOND .7 (GAUZE/BANDAGES/DRESSINGS) ×1
APL PRP STRL LF DISP 70% ISPRP (MISCELLANEOUS) ×1
APL SRG 32X5 SNPLK LF DISP (MISCELLANEOUS) ×1
APPLIER CLIP ROT 10 11.4 M/L (STAPLE)
APR CLP MED LRG 11.4X10 (STAPLE)
BAG SPEC RTRVL 10 TROC 200 (ENDOMECHANICALS)
BAG SPEC RTRVL LRG 6X4 10 (ENDOMECHANICALS) ×1
CABLE HIGH FREQUENCY MONO STRZ (ELECTRODE) IMPLANT
CHLORAPREP W/TINT 26 (MISCELLANEOUS) ×3 IMPLANT
CLIP APPLIE ROT 10 11.4 M/L (STAPLE) IMPLANT
CLIP SUT LAPRA TY ABSORB (SUTURE) ×2 IMPLANT
COVER SURGICAL LIGHT HANDLE (MISCELLANEOUS) ×3 IMPLANT
COVER WAND RF STERILE (DRAPES) IMPLANT
CUTTER FLEX LINEAR 45M (STAPLE) ×3 IMPLANT
DECANTER SPIKE VIAL GLASS SM (MISCELLANEOUS) IMPLANT
DERMABOND ADVANCED (GAUZE/BANDAGES/DRESSINGS) ×2
DERMABOND ADVANCED .7 DNX12 (GAUZE/BANDAGES/DRESSINGS) ×1 IMPLANT
DEVICE SUTURE ENDOST 10MM (ENDOMECHANICALS) ×2 IMPLANT
DRAIN CHANNEL 19F RND (DRAIN) ×2 IMPLANT
DRAPE LAPAROSCOPIC ABDOMINAL (DRAPES) IMPLANT
DRSG TEGADERM 2-3/8X2-3/4 SM (GAUZE/BANDAGES/DRESSINGS) ×8 IMPLANT
DRSG TEGADERM 4X4.75 (GAUZE/BANDAGES/DRESSINGS) ×2 IMPLANT
ELECT REM PT RETURN 15FT ADLT (MISCELLANEOUS) ×3 IMPLANT
ENDOLOOP SUT PDS II  0 18 (SUTURE)
ENDOLOOP SUT PDS II 0 18 (SUTURE) IMPLANT
EVACUATOR SILICONE 100CC (DRAIN) ×2 IMPLANT
GAUZE SPONGE 2X2 8PLY STRL LF (GAUZE/BANDAGES/DRESSINGS) IMPLANT
GLOVE BIOGEL M 8.0 STRL (GLOVE) ×3 IMPLANT
GOWN STRL REUS W/TWL XL LVL3 (GOWN DISPOSABLE) ×3 IMPLANT
KIT BASIN OR (CUSTOM PROCEDURE TRAY) ×3 IMPLANT
KIT TURNOVER KIT A (KITS) IMPLANT
PAD POSITIONING PINK XL (MISCELLANEOUS) ×3 IMPLANT
PENCIL SMOKE EVACUATOR (MISCELLANEOUS) IMPLANT
POUCH RETRIEVAL ECOSAC 10 (ENDOMECHANICALS) IMPLANT
POUCH RETRIEVAL ECOSAC 10MM (ENDOMECHANICALS)
POUCH SPECIMEN RETRIEVAL 10MM (ENDOMECHANICALS) ×3 IMPLANT
RELOAD 45 VASCULAR/THIN (ENDOMECHANICALS) IMPLANT
RELOAD ENDO STITCH 2.0 (ENDOMECHANICALS) ×18
RELOAD STAPLE 45 2.5 WHT GRN (ENDOMECHANICALS) IMPLANT
RELOAD STAPLE 45 3.5 BLU ETS (ENDOMECHANICALS) IMPLANT
RELOAD STAPLE TA45 3.5 REG BLU (ENDOMECHANICALS) IMPLANT
RELOAD SUT SNGL STCH ABSRB 2-0 (ENDOMECHANICALS) IMPLANT
RELOAD SUT SNGL STCH BLK 2-0 (ENDOMECHANICALS) IMPLANT
SCISSORS LAP 5X45 EPIX DISP (ENDOMECHANICALS) ×3 IMPLANT
SEALANT SURGICAL APPL DUAL CAN (MISCELLANEOUS) ×2 IMPLANT
SET IRRIG TUBING LAPAROSCOPIC (IRRIGATION / IRRIGATOR) ×3 IMPLANT
SET TUBE SMOKE EVAC HIGH FLOW (TUBING) ×3 IMPLANT
SHEARS HARMONIC ACE PLUS 45CM (MISCELLANEOUS) ×3 IMPLANT
SLEEVE XCEL OPT CAN 5 100 (ENDOMECHANICALS) ×5 IMPLANT
SPONGE GAUZE 2X2 STER 10/PKG (GAUZE/BANDAGES/DRESSINGS) ×2
STAPLER VISISTAT 35W (STAPLE) ×2 IMPLANT
SUT ETHILON 2 0 PS N (SUTURE) ×2 IMPLANT
SUT MNCRL AB 4-0 PS2 18 (SUTURE) ×3 IMPLANT
SUT RELOAD ENDO STITCH 2 48X1 (ENDOMECHANICALS) ×5
SUT RELOAD ENDO STITCH 2.0 (ENDOMECHANICALS) ×1
SUTURE RELOAD END STTCH 2 48X1 (ENDOMECHANICALS) ×5 IMPLANT
SUTURE RELOAD ENDO STITCH 2.0 (ENDOMECHANICALS) ×1 IMPLANT
TOWEL OR 17X26 10 PK STRL BLUE (TOWEL DISPOSABLE) ×3 IMPLANT
TRAY FOLEY MTR SLVR 14FR STAT (SET/KITS/TRAYS/PACK) IMPLANT
TRAY FOLEY MTR SLVR 16FR STAT (SET/KITS/TRAYS/PACK) IMPLANT
TRAY LAPAROSCOPIC (CUSTOM PROCEDURE TRAY) ×3 IMPLANT
TROCAR BLADELESS OPT 5 100 (ENDOMECHANICALS) ×3 IMPLANT
TROCAR XCEL BLUNT TIP 100MML (ENDOMECHANICALS) ×3 IMPLANT
TROCAR XCEL NON-BLD 11X100MML (ENDOMECHANICALS) IMPLANT

## 2019-09-10 NOTE — Progress Notes (Signed)
Good effort with NIF of -30. Unable to obtain FVC, because pt went to surgery. RT will continue to monitor later.

## 2019-09-10 NOTE — Anesthesia Preprocedure Evaluation (Addendum)
Anesthesia Evaluation  Patient identified by MRN, date of birth, ID band Patient awake    Reviewed: Allergy & Precautions, NPO status , Patient's Chart, lab work & pertinent test results  Airway Mallampati: III  TM Distance: >3 FB Neck ROM: Full    Dental no notable dental hx.    Pulmonary former smoker,    Pulmonary exam normal breath sounds clear to auscultation       Cardiovascular hypertension, Pt. on medications and Pt. on home beta blockers Normal cardiovascular exam+ dysrhythmias Atrial Fibrillation  Rhythm:Regular Rate:Normal  ECG: a-fib, rate 114  Myocardial perfusion: There was no ST segment deviation noted during stress. Nuclear stress EF: 55%. The left ventricular ejection fraction is normal (55-65%). The study is normal. This is a low risk study.   Fixed apical inferior perfusion defect.  Normal wall motion in this area suggests defect is artifact.    Neuro/Psych Myasthenia gravis  Neuromuscular disease negative psych ROS   GI/Hepatic negative GI ROS, Neg liver ROS,   Endo/Other  negative endocrine ROS  Renal/GU negative Renal ROS     Musculoskeletal negative musculoskeletal ROS (+)   Abdominal (+) + obese,   Peds  Hematology HLD   Anesthesia Other Findings ACUTE APPENDICITIS  Reproductive/Obstetrics                            Anesthesia Physical Anesthesia Plan  ASA: III  Anesthesia Plan: General   Post-op Pain Management:    Induction: Intravenous  PONV Risk Score and Plan: 3 and Ondansetron, Dexamethasone and Treatment may vary due to age or medical condition  Airway Management Planned: Oral ETT  Additional Equipment:   Intra-op Plan:   Post-operative Plan: Extubation in OR  Informed Consent: I have reviewed the patients History and Physical, chart, labs and discussed the procedure including the risks, benefits and alternatives for the proposed anesthesia  with the patient or authorized representative who has indicated his/her understanding and acceptance.     Dental advisory given  Plan Discussed with: CRNA  Anesthesia Plan Comments:        Anesthesia Quick Evaluation

## 2019-09-10 NOTE — Progress Notes (Addendum)
Pt returned to [place] after fentanyl dose & HR came down. Pt tends to hold his breath when having waves of pain, pain has been either on LLQ or RLQ, worse with coughing or movement and has needed a lot of encouragent to take deep breaths or to tilt side to side.

## 2019-09-10 NOTE — Transfer of Care (Signed)
Immediate Anesthesia Transfer of Care Note  Patient: [name]  Procedure(s) Performed: APPENDECTOMY LAPAROSCOPIC (N/A )  Patient Location: PACU  Anesthesia Type:General  Level of Consciousness: drowsy  Airway & Oxygen Therapy: Patient Spontanous Breathing and Patient connected to face mask oxygen  Post-op Assessment: Report given to RN and Post -op Vital signs reviewed and stable  Post vital signs: Reviewed and stable  Last Vitals:  Vitals Value Taken Time  BP 132/95 09/10/19 1605  Temp 36.5 C 09/10/19 1605  Pulse 105 09/10/19 1606  Resp 16 09/10/19 1606  SpO2 98 % 09/10/19 1606  Vitals shown include unvalidated device data.  Last Pain:  Vitals:   09/10/19 1147  TempSrc:   PainSc: 3       Patients Stated Pain Goal: 1 (09/10/19 1147)  Complications: No complications documented.

## 2019-09-10 NOTE — Op Note (Signed)
[name]  09-03-1945   09/10/2019    PCP:  [name], MD   Surgeon: Wenda Low, MD, FACS  Asst:  none  Anes:  general  Preop Dx: Acute appendicitis in patient with nonrotation of the midgut/hindgut and prior appendicitis treated medically Postop Dx: Acute gangrenous appendicitis with perforation at the base of the appendix, purulent peritonitis  Procedure: Laparoscopic appendectomy with closure of colon perforations (5 mm trocar injury & avulsion of the appendix from the cecum) Location Surgery: [facility] Complications: 5 mm trocar injury into the colon (repaired in two layers)  EBL:   10 cc  Drains: 19 Blake along the left abdomen     Description of Procedure:  The patient was taken to OR 4 .  After anesthesia was administered and the patient was prepped  with chloroprep  and a timeout was performed.  Access to the abdomen was achieved through the left upper quadrant with a 5 mm trocar.  Despite my slow calculated entry I visualized the insides of the colon and I pulled back and noted that the "ascending colon" was attached more medially and the whispy tissues that I saw were adhesions of the colon to the abdominal wall.  The colon bowel wall was noted to be paper thin throughout.    I placed an 11 mm trocar at the umbilicus and multiple (4) 5 mm trocars.  I took down the anterior adhesions and exposed the small trocar injury.  Two interrupted 2-0 vicryl sutures were placed with the endostitch and a second layer of closure was performed with a horizontal mattress suture of 2-0 silk.  Tisseal was applied and colonic fat further patched the closure.    Attention was directed to the left lower quadrant where I took the above pictures.  Inflamed and distorted tissue was noted.  Yellow purulence bathed the entire area and I later evacuated purulence above his liver.  The taenia was identified and this was the landmark needed to orient the anatomy.  A tubular structure was  identified and blunt dissection was used to free it from the surrounding structures including the terminal ileum and small bowel loops.  The Harmonic scalpel was used to divide the mesentery.  The appendix had perforated at the base and was minimally attached to the cecum.  It was removed and placed in a bag.  I later examined the appendix and found a tubular structure with a lumen consistent with the remnant and tip of the appendix.  The base of the appendix was oversewn with a running 2-0 vicryl secured with a Ty Knot.    A 19 Blake drain was left along the cecum and up the abdomen toward the left upper quadran.  The colon perforation area appeared dry.  There never appeared any feculent drainage and the closure appeared to be intact.  Purulence over the right hepatic area was aspirated.    The patient tolerated the procedure well and was taken to the PACU in stable condition.     Matt B. Daphine Deutscher, MD, [facility], [place] [phone]

## 2019-09-10 NOTE — Progress Notes (Signed)
[facility]                                                                              Patient Demographics  [name], is a 74 y.o. male, DOB - [birth year], WRU:[id number]  Admit date - 09/08/2019   Admitting Physician John Giovanni, MD  Outpatient Primary MD for the patient is [name], MD  Outpatient specialists:   LOS - 2  days   Medical records reviewed and are as summarized below:    Chief Complaint  Patient presents with  . Post-op Problem       Brief summary   Patient is a 74 year old male with a history of congenital intestinal malrotation, A. fib on Eliquis, myasthenia gravis on CellCept, hypertension, hyperlipidemia, GERD presented to ED with fever, abdominal pain and distention.  Patient reported that he had a urological procedure done on Monday, 7/27 at [facility] urology and Foley catheter was placed.  He was advised to come to ED if he spiked fevers or had abdominal pain or distention.  Patient presented to ED with all the symptoms and abdominal pain, LLQ 8/10 with nausea.  Patient reported that his neurologist, Dr Terrace Arabia tried tapering down the dose of his CellCept 6 months ago but he became symptomatic so the dose has to be increased again.  Since then he has been stable.  No new weakness, double vision, or difficulty with speech/swallowing.  He has been coughing a little for the past few days.  Showed acute appendicitis in the   Assessment & Plan    Principal Problem: Sepsis secondary to acute appendicitis, LLQ -Confirmed on CT with history of congenital intestinal malrotation.  Presented with tachycardia, fever, leukocytosis, source likely due to acute appendicitis.  Lactic acid normal -Continue IV fluids, IV Zosyn -Increased pain control, visibly in discomfort at the time of my examination -Appreciate neurology commendations, plan for surgery today  Active Problems: Chronic atrial fibrillation with RVR -At the time of my  examination, A. fib with RVR with a heart rate in 130s. -Rate controlled, continue digoxin. Continue to hold Eliquis until cleared by general surgery  -Appreciate cardiology recommendation, had a recent nuclear stress test, echo 03/2018 had shown EF of 60 to 65%     Myasthenia gravis (HCC) - Continue CellCept, patient reports that he has missed Mestinon for 3 days.  Typically takes it 60 mg as needed since CellCept was started and he had been doing well. - Continue to hold ramipril. Resumed Mestinon. Hold CellCept due to active infection - Neurology following, appreciate recommendations    Hypertension -Continue triamterene HCTZ   BPH -Per patient, had urological procedure on 7/27,, urology, Dr. Mena Goes - Foley is supposed to come out on 7/29   Obesity Estimated body mass index is 30.85 kg/m as calculated from the following:   Height as of this encounter: 5\' 7"  (1.702 m).   Weight as of this encounter: 89.4 kg.  Code Status: Full code DVT Prophylaxis: Heparin subcu Family Communication: Discussed all imaging results, lab results, explained to the patient and patient's wife on phone   Disposition Plan:     Status is: Inpatient  Remains inpatient appropriate because:Inpatient level of care appropriate due to severity of illness   Dispo: The patient is from: Home              Anticipated d/c is to: Home              Anticipated d/c date is: 2 days              Patient currently is not medically stable to d/c. Plan for surgery today    Time Spent in minutes 25 minutes  Procedures:  None  Consultants:   General surgery Neurology  Antimicrobials:   Anti-infectives (From admission, onward)   Start     Dose/Rate Route Frequency Ordered Stop   09/09/19 0600  [MAR Hold]  piperacillin-tazobactam (ZOSYN) IVPB 3.375 g     Discontinue     (MAR Hold since Thu 09/10/2019 at 1134.Hold Reason: Transfer to a Procedural area.)   3.375 g 12.5 mL/hr over 240 Minutes Intravenous  Every 8 hours 09/09/19 0142     09/08/19 2245  piperacillin-tazobactam (ZOSYN) IVPB 3.375 g        3.375 g 100 mL/hr over 30 Minutes Intravenous  Once 09/08/19 2233 09/09/19 0032         Medications  Scheduled Meds: . [MAR Hold] atorvastatin  20 mg Oral Daily  . [MAR Hold] Chlorhexidine Gluconate Cloth  6 each Topical Daily  . [MAR Hold] digoxin  0.25 mg Oral Daily  . [MAR Hold] heparin  5,000 Units Subcutaneous Q8H  . [MAR Hold] mupirocin ointment  1 application Nasal BID  . [MAR Hold] pyridostigmine  60 mg Oral TID  . [MAR Hold] tamsulosin  0.4 mg Oral Daily  . [MAR Hold] triamterene-hydrochlorothiazide  1.5 tablet Oral Daily   Continuous Infusions: . lactated ringers 100 mL/hr at 09/10/19 1258  . lactated ringers    . [MAR Hold] piperacillin-tazobactam (ZOSYN)  IV 3.375 g (09/10/19 0524)   PRN Meds:.0.9 % irrigation (POUR BTL), [MAR Hold] acetaminophen **OR** [MAR Hold] acetaminophen, fibrin sealant, lactated ringers, [MAR Hold]  morphine injection      Subjective:   Lyan Moyano was seen and examined today. Visibly in discomfort, pain in both lower quadrants, no fevers or chills. No nausea or vomiting. No chest pain.   Objective:   Vitals:   09/10/19 0106 09/10/19 0501 09/10/19 0937 09/10/19 1146  BP: 128/78 (!) 141/94 (!) 131/88 (!) 146/86  Pulse: 94 94 92 89  Resp: 17 18 20 18   Temp: 98.2 F (36.8 C) 97.7 F (36.5 C) 98.7 F (37.1 C) 98.3 F (36.8 C)  TempSrc: Oral Oral Oral Oral  SpO2: 98% 94% 96% 95%  Weight:      Height:        Intake/Output Summary (Last 24 hours) at 09/10/2019 1452 Last data filed at 09/10/2019 1438 Gross per 24 hour  Intake 1300 ml  Output 1120 ml  Net 180 ml     Wt Readings from Last 3 Encounters:  09/08/19 89.4 kg  09/03/19 89.7 kg  08/05/19 88.5 kg   Physical Exam  General: Alert and oriented x 3, NAD  Cardiovascular: S1 S2 clear, RRR. No pedal edema b/l  Respiratory: CTAB, no wheezing, rales or  rhonchi  Gastrointestinal: Soft, TTP in LLQ, rebound in right lower quadrant, no peritoneal signs  Ext: no pedal edema bilaterally  Neuro: no new deficits  Musculoskeletal: No cyanosis, clubbing  Skin: No rashes  Psych: Normal affect and demeanor, alert and oriented x3  Data Reviewed:  I have personally reviewed following labs and imaging studies  Micro Results Recent Results (from the past 240 hour(s))  Urine C&S     Status: Abnormal   Collection Time: 09/08/19  8:22 PM   Specimen: Urine, Catheterized  Result Value Ref Range Status   Specimen Description   Final    URINE, CATHETERIZED Performed at [facility], [address], [place], [place] [address]    Special Requests NONE  Final   Culture (A)  Final    <10,000 COLONIES/mL INSIGNIFICANT GROWTH Performed at [facility], [address], [place], [place] [address]    Report Status 09/10/2019 FINAL  Final  SARS Coronavirus 2 by RT PCR (hospital order, performed in [facility] hospital lab) Nasopharyngeal Nasopharyngeal Swab     Status: None   Collection Time: 09/08/19 11:30 PM   Specimen: Nasopharyngeal Swab  Result Value Ref Range Status   SARS Coronavirus 2 NEGATIVE NEGATIVE Final    Comment: (NOTE) SARS-CoV-2 target nucleic acids are NOT DETECTED.  The SARS-CoV-2 RNA is generally detectable in upper and lower respiratory specimens during the acute phase of infection. The lowest concentration of SARS-CoV-2 viral copies this assay can detect is 250 copies / mL. A negative result does not preclude SARS-CoV-2 infection and should not be used as the sole basis for treatment or other patient management decisions.  A negative result may occur with improper specimen collection / handling, submission of specimen other than nasopharyngeal swab, presence of viral mutation(s) within the areas targeted by this assay, and inadequate number of viral copies (<250 copies / mL). A negative result  must be combined with clinical observations, patient history, and epidemiological information.  Fact Sheet for Patients:   BoilerBrush.com.cy  Fact Sheet for Healthcare Providers: https://pope.com/  This test is not yet approved or  cleared by the [place] FDA and has been authorized for detection and/or diagnosis of SARS-CoV-2 by FDA under an Emergency Use Authorization (EUA).  This EUA will remain in effect (meaning this test can be used) for the duration of the COVID-19 declaration under Section 564(b)(1) of the Act, 21 U.S.C. section 360bbb-3(b)(1), unless the authorization is terminated or revoked sooner.  Performed at [facility], [address], [place], [place] [address]   Surgical PCR screen     Status: None   Collection Time: 09/10/19  4:32 AM   Specimen: Nasal Mucosa; Nasal Swab  Result Value Ref Range Status   MRSA, PCR NEGATIVE NEGATIVE Final   Staphylococcus aureus NEGATIVE NEGATIVE Final    Comment: (NOTE) The Xpert SA Assay (FDA approved for NASAL specimens in patients 38 years of age and older), is one component of a comprehensive surveillance program. It is not intended to diagnose infection nor to guide or monitor treatment. Performed at [facility], [address], [place], [place] [address]     Radiology Reports CT ABDOMEN PELVIS WO CONTRAST  Result Date: 09/08/2019 CLINICAL DATA:  Fever and abdominal distension, history of recent prostate procedure EXAM: CT ABDOMEN AND PELVIS WITHOUT CONTRAST TECHNIQUE: Multidetector CT imaging of the abdomen and pelvis was performed following the standard protocol without IV contrast. COMPARISON:  05/17/2019 FINDINGS: Lower chest: Lung bases are clear. Previously seen nodular density in the right lower lobe represent some focal eventration of the diaphragm. No other focal abnormality is noted. Hepatobiliary: No focal liver  abnormality is seen. No gallstones, gallbladder wall thickening, or biliary dilatation. Pancreas: Unremarkable. No pancreatic  ductal dilatation or surrounding inflammatory changes. Spleen: Normal in size without focal abnormality. Adrenals/Urinary Tract: Adrenal glands demonstrate nodularity stable from the prior exam. No renal calculi are seen. Hypodensity in the left mid kidney is noted consistent with cyst. The bladder is decompressed by Foley catheter. Stomach/Bowel: Non rotation of the bowel is noted with the cecum in the left lower quadrant and the majority of the small bowel on the right. The appendix is visualized but small. Mild dilatation is seen with mild inflammatory changes suspicious for acute appendicitis. Correlate with laboratory values. No other focal bowel abnormality is noted. Vascular/Lymphatic: Aortic atherosclerosis. No enlarged abdominal or pelvic lymph nodes. Reproductive: Prostate is enlarged. Other: No abdominal wall hernia or abnormality. No abdominopelvic ascites. Musculoskeletal: Degenerative changes of lumbar spine are noted. No acute abnormality is seen. IMPRESSION: Changes suspicious for acute appendicitis in the left lower quadrant due to non rotation of the bowel. These changes have progressed in the interval from the prior exam. The appendix measures approximately 14 mm in greatest dimension. No abscess is seen. Chronic changes stable from the previous exam. Electronically Signed   By: Alcide Clever M.D.   On: 09/08/2019 21:58   DG CHEST PORT 1 VIEW  Result Date: 09/09/2019 CLINICAL DATA:  Increased shortness of breath this morning EXAM: PORTABLE CHEST 1 VIEW COMPARISON:  09/20/2016 FINDINGS: No cardiomegaly. Borderline vascular congestion. No edema, consolidation, effusion, or pneumothorax. IMPRESSION: Borderline vascular congestion. Electronically Signed   By: Marnee Spring M.D.   On: 09/09/2019 07:27    Lab Data:  CBC: Recent Labs  Lab 09/08/19 2224 09/08/19 2233  09/09/19 0513 09/10/19 0301  WBC 15.8*  --  15.7* 11.7*  NEUTROABS 14.8*  --   --  10.3*  HGB 15.9 16.3 15.4 14.6  HCT 47.4 48.0 47.0 45.2  MCV 97.1  --  99.4 100.7*  PLT 143*  --  128* 137*   Basic Metabolic Panel: Recent Labs  Lab 09/08/19 2224 09/08/19 2233 09/10/19 0301  NA 136 138 137  K 3.9 3.7 3.8  CL 97* 97* 101  CO2 26  --  27  GLUCOSE 112* 108* 84  BUN 17 19 18   CREATININE 0.64 0.80 0.96  CALCIUM 8.9  --  8.3*   GFR: Estimated Creatinine Clearance: 70.9 mL/min (by C-G formula based on SCr of 0.96 mg/dL). Liver Function Tests: Recent Labs  Lab 09/08/19 2224 09/09/19 0513  AST 25  --   ALT 23  --   ALKPHOS 62  --   BILITOT 1.9* 1.7*  PROT 6.8  --   ALBUMIN 3.6  --    Recent Labs  Lab 09/08/19 2224  LIPASE 26   No results for input(s): AMMONIA in the last 168 hours. Coagulation Profile: No results for input(s): INR, PROTIME in the last 168 hours. Cardiac Enzymes: No results for input(s): CKTOTAL, CKMB, CKMBINDEX, TROPONINI in the last 168 hours. BNP (last 3 results) No results for input(s): PROBNP in the last 8760 hours. HbA1C: No results for input(s): HGBA1C in the last 72 hours. CBG: Recent Labs  Lab 09/10/19 0942  GLUCAP 88   Lipid Profile: No results for input(s): CHOL, HDL, LDLCALC, TRIG, CHOLHDL, LDLDIRECT in the last 72 hours. Thyroid Function Tests: No results for input(s): TSH, T4TOTAL, FREET4, T3FREE, THYROIDAB in the last 72 hours. Anemia Panel: No results for input(s): VITAMINB12, FOLATE, FERRITIN, TIBC, IRON, RETICCTPCT in the last 72 hours. Urine analysis:    Component Value Date/Time   COLORURINE YELLOW 09/08/2019 2022  APPEARANCEUR HAZY (A) 09/08/2019 2022   LABSPEC 1.025 09/08/2019 2022   PHURINE 6.0 09/08/2019 2022   GLUCOSEU NEGATIVE 09/08/2019 2022   HGBUR LARGE (A) 09/08/2019 2022   BILIRUBINUR SMALL (A) 09/08/2019 2022   KETONESUR 40 (A) 09/08/2019 2022   PROTEINUR >300 (A) 09/08/2019 2022   UROBILINOGEN 0.2  07/05/2007 2323   NITRITE NEGATIVE 09/08/2019 2022   LEUKOCYTESUR TRACE (A) 09/08/2019 2022     Shondell Fabel M.D. [facility] 09/10/2019, 2:52 PM   Call night coverage person covering after 7pm

## 2019-09-10 NOTE — Anesthesia Postprocedure Evaluation (Signed)
Anesthesia Post Note  Patient: [name]  Procedure(s) Performed: APPENDECTOMY LAPAROSCOPIC (N/A )     Patient location during evaluation: PACU Anesthesia Type: General Level of consciousness: awake and alert and awake Pain management: pain level controlled Vital Signs Assessment: post-procedure vital signs reviewed and stable Respiratory status: spontaneous breathing, nonlabored ventilation, respiratory function stable and patient connected to nasal cannula oxygen Cardiovascular status: blood pressure returned to baseline and stable Postop Assessment: no apparent nausea or vomiting Anesthetic complications: no   No complications documented.  Last Vitals:  Vitals:   09/10/19 1645 09/10/19 1725  BP: (!) 129/92 116/83  Pulse: (!) 126 86  Resp: 17 16  Temp: 36.5 C 36.6 C  SpO2: 96% 95%    Last Pain:  Vitals:   09/10/19 1645  TempSrc:   PainSc: Asleep                 Cecile Hearing

## 2019-09-10 NOTE — Progress Notes (Signed)
   09/09/19 2108  Assess: MEWS Score  Temp 98.5 F (36.9 C)  BP (!) 139/93  Pulse Rate (!) 111  Resp 19  SpO2 98 %  Assess: MEWS Score  MEWS Temp 0  MEWS Systolic 0  MEWS Pulse 2  MEWS RR 0  MEWS LOC 0  MEWS Score 2  MEWS Score Color Yellow  Assess: if the MEWS score is Yellow or Red  Were vital signs taken at a resting state? Yes  Focused Assessment Change from prior assessment (see assessment flowsheet)  Early Detection of Sepsis Score *See Row Information* Low  MEWS guidelines implemented *See Row Information* Yes  Treat  MEWS Interventions Administered prn meds/treatments;Escalated (See documentation below)  Pain Scale 0-10  Pain Score 9  Pain Type Acute pain  Pain Location Abdomen  Pain Orientation Left;Mid;Lower  Pain Descriptors / Indicators Grimacing;Guarding;Sharp;Shooting  Pain Frequency Intermittent  Pain Intervention(s) MD notified (Comment);RN made aware  Take Vital Signs  Increase Vital Sign Frequency  Yellow: Q 2hr X 2 then Q 4hr X 2, if remains yellow, continue Q 4hrs  Escalate  MEWS: Escalate Yellow: discuss with charge nurse/RN and consider discussing with provider and RRT  Notify: Charge Nurse/RN  Name of Charge Nurse/RN Notified Tom RN   Date Charge Nurse/RN Notified 09/09/19  Time Charge Nurse/RN Notified 2115  YELLOW MEWS initiated , HR elevated b/o abd pain. OnCall provider notified scheduled PRN Morphine is not managing the pain. Requesting additional pain meds , will monitor

## 2019-09-10 NOTE — Anesthesia Procedure Notes (Signed)
Procedure Name: Intubation Date/Time: 09/10/2019 1:08 PM Performed by: Vanessa Weissport, CRNA Pre-anesthesia Checklist: Patient identified, Emergency Drugs available, Suction available and Patient being monitored Patient Re-evaluated:Patient Re-evaluated prior to induction Oxygen Delivery Method: Circle system utilized Preoxygenation: Pre-oxygenation with 100% oxygen Induction Type: IV induction Ventilation: Mask ventilation without difficulty Laryngoscope Size: Miller and 2 Grade View: Grade I Tube type: Oral Tube size: 7.0 mm Number of attempts: 1 Airway Equipment and Method: Stylet and Oral airway Placement Confirmation: ETT inserted through vocal cords under direct vision,  positive ETCO2 and breath sounds checked- equal and bilateral Secured at: 22 cm Tube secured with: Tape Dental Injury: Teeth and Oropharynx as per pre-operative assessment

## 2019-09-10 NOTE — Interval H&P Note (Signed)
History and Physical Interval Note:  09/10/2019 12:56 PM  [name]  has presented today for surgery, with the diagnosis of ACUTE APPENDICITIS.  The various methods of treatment have been discussed with the patient and family. After consideration of risks, benefits and other options for treatment, the patient has consented to  Procedure(s): APPENDECTOMY LAPAROSCOPIC (N/A) as a surgical intervention.  The patient's history has been reviewed, patient examined, no change in status, stable for surgery.  I have reviewed the patient's chart and labs.  Questions were answered to the patient's satisfaction.     Valarie Merino

## 2019-09-10 NOTE — Progress Notes (Signed)
Respiratory Parameters obtained with good effort: NIF: -20 cmh20, VC: 1.7 lpm

## 2019-09-11 ENCOUNTER — Inpatient Hospital Stay (HOSPITAL_COMMUNITY): Payer: Medicare PPO

## 2019-09-11 ENCOUNTER — Encounter (HOSPITAL_COMMUNITY): Payer: Self-pay | Admitting: Surgery

## 2019-09-11 ENCOUNTER — Inpatient Hospital Stay: Payer: Self-pay

## 2019-09-11 DIAGNOSIS — R1030 Lower abdominal pain, unspecified: Secondary | ICD-10-CM

## 2019-09-11 LAB — CBC
HCT: 46.8 % (ref 39.0–52.0)
Hemoglobin: 15.2 g/dL (ref 13.0–17.0)
MCH: 32.3 pg (ref 26.0–34.0)
MCHC: 32.5 g/dL (ref 30.0–36.0)
MCV: 99.4 fL (ref 80.0–100.0)
Platelets: 166 10*3/uL (ref 150–400)
RBC: 4.71 MIL/uL (ref 4.22–5.81)
RDW: 13 % (ref 11.5–15.5)
WBC: 9 10*3/uL (ref 4.0–10.5)
nRBC: 0 % (ref 0.0–0.2)

## 2019-09-11 LAB — BASIC METABOLIC PANEL
Anion gap: 11 (ref 5–15)
BUN: 22 mg/dL (ref 8–23)
CO2: 24 mmol/L (ref 22–32)
Calcium: 8.3 mg/dL — ABNORMAL LOW (ref 8.9–10.3)
Chloride: 99 mmol/L (ref 98–111)
Creatinine, Ser: 0.76 mg/dL (ref 0.61–1.24)
GFR calc Af Amer: >60 mL/min (ref >60)
GFR calc non Af Amer: >60 mL/min (ref >60)
Glucose, Bld: 151 mg/dL — ABNORMAL HIGH (ref 70–99)
Potassium: 3.7 mmol/L (ref 3.5–5.1)
Sodium: 134 mmol/L — ABNORMAL LOW (ref 135–145)

## 2019-09-11 LAB — PREALBUMIN: Prealbumin: 7.9 mg/dL — ABNORMAL LOW (ref 18–38)

## 2019-09-11 LAB — GLUCOSE, CAPILLARY: Glucose-Capillary: 154 mg/dL — ABNORMAL HIGH (ref 70–99)

## 2019-09-11 MED ORDER — DIGOXIN 0.25 MG/ML IJ SOLN
0.2500 mg | Freq: Every day | INTRAMUSCULAR | Status: DC
  Administered 2019-09-12 – 2019-09-14 (×3): 0.25 mg via INTRAVENOUS
  Filled 2019-09-11 (×5): qty 1

## 2019-09-11 MED ORDER — SODIUM CHLORIDE 0.9% FLUSH
10.0000 mL | Freq: Two times a day (BID) | INTRAVENOUS | Status: DC
  Administered 2019-09-15 – 2019-09-16 (×3): 10 mL

## 2019-09-11 MED ORDER — TRAVASOL 10 % IV SOLN
INTRAVENOUS | Status: AC
  Filled 2019-09-11: qty 499.2

## 2019-09-11 MED ORDER — INSULIN ASPART 100 UNIT/ML ~~LOC~~ SOLN
0.0000 [IU] | Freq: Four times a day (QID) | SUBCUTANEOUS | Status: DC
  Administered 2019-09-11: 2 [IU] via SUBCUTANEOUS
  Administered 2019-09-12 (×3): 1 [IU] via SUBCUTANEOUS
  Administered 2019-09-13: 2 [IU] via SUBCUTANEOUS
  Administered 2019-09-13 – 2019-09-14 (×5): 1 [IU] via SUBCUTANEOUS

## 2019-09-11 MED ORDER — ACETAMINOPHEN 10 MG/ML IV SOLN
1000.0000 mg | Freq: Four times a day (QID) | INTRAVENOUS | Status: AC
  Administered 2019-09-11 – 2019-09-12 (×4): 1000 mg via INTRAVENOUS
  Filled 2019-09-11 (×4): qty 100

## 2019-09-11 MED ORDER — SODIUM CHLORIDE 0.9% FLUSH: 10.0000 mL | INTRAVENOUS | Status: DC | PRN

## 2019-09-11 NOTE — Care Management Important Message (Signed)
Important Message  Patient Details IM Letter given to the Patient Name: [name] MRN: [id number] Date of Birth: 1946-01-06   Medicare Important Message Given:  Yes     Caren Macadam 09/11/2019, 11:54 AM

## 2019-09-11 NOTE — Progress Notes (Addendum)
1 Day Post-Op    CC:  Abdominal pain  Subjective: He says he feels better, starting to pass some flatus with some liquid.  High pitched BS, dressing over port sites OK.  Objective: Vital signs in last 24 hours: Temp:  [97.7 F (36.5 C)-98.7 F (37.1 C)] 98.6 F (37 C) (07/30 0600) Pulse Rate:  [81-126] 81 (07/30 0600) Resp:  [14-20] 17 (07/30 0600) BP: (116-146)/(83-101) 126/89 (07/30 0600) SpO2:  [95 %-100 %] 99 % (07/30 0600) Last BM Date: 09/07/19 N.p.o. 1700 IV Urine 1700 No NG/emesis JP drain 110 No BM Patient is afebrile blood pressure is elevated 130/92 range last evening Heart rate down in the 80s this a.m. Sodium 134, potassium 3.7, glucose 151 WBC 9.0, H/H 15.2/46.8. Platelets 166,000.    Intake/Output from previous day: 07/29 0701 - 07/30 0700 In: 1700 [I.V.:1400; IV Piggyback:300] Out: 1860 [Urine:1700; Drains:110; Blood:50] Intake/Output this shift: No intake/output data recorded.  General appearance: alert, cooperative and no distress Resp: clear to auscultation bilaterally GI: soft, sore, High pitched BS, starting to have some flatus.  Port sites OK  Lab Results:  Recent Labs    09/10/19 0301 09/11/19 0304  WBC 11.7* 9.0  HGB 14.6 15.2  HCT 45.2 46.8  PLT 137* 166    BMET Recent Labs    09/10/19 0301 09/11/19 0304  NA 137 134*  K 3.8 3.7  CL 101 99  CO2 27 24  GLUCOSE 84 151*  BUN 18 22  CREATININE 0.96 0.76  CALCIUM 8.3* 8.3*   PT/INR No results for input(s): LABPROT, INR in the last 72 hours.  Recent Labs  Lab 09/08/19 2224 09/09/19 0513  AST 25  --   ALT 23  --   ALKPHOS 62  --   BILITOT 1.9* 1.7*  PROT 6.8  --   ALBUMIN 3.6  --      Lipase     Component Value Date/Time   LIPASE 26 09/08/2019 2224     Medications: . atorvastatin  20 mg Oral Daily  . Chlorhexidine Gluconate Cloth  6 each Topical Daily  . digoxin  0.25 mg Oral Daily  . heparin  5,000 Units Subcutaneous Q8H  . [START ON 09/12/2019] insulin  aspart  0-9 Units Subcutaneous Q6H  . mupirocin ointment  1 application Nasal BID  . pyridostigmine  60 mg Oral TID  . tamsulosin  0.4 mg Oral Daily  . triamterene-hydrochlorothiazide  1.5 tablet Oral Daily   . methocarbamol (ROBAXIN) IV    . piperacillin-tazobactam (ZOSYN)  IV 3.375 g (09/11/19 0612)  . TPN ADULT (ION)      Assessment/Plan Sepsis secondary to acute appendicitis Myasthenia gravis - Mestion, CellCept Chronic AF - Digoxin, Eliquis Hypertension BPH BMI 30.85 Thrombocytopenia - platelets 128>> 137>> 166 Protein calorie malnutrition - prealbumin pending   Acute gangrenous appendicitis with perforation at the base of the appendix, purulent peritonitis Laparoscopic appendectomy with closure of colon perforation(5 mm trocar injury and avulsion of the appendix from the cecum,) Blake drain placement, 09/10/2019, Dr. Luretha Murphy  - post op ileus   FEN: N.p.o./adding PICC line/TPN ID: Zosyn 7/28 >> day 3 DVT: Heparin SQ Follow-up: Dr. Daphine Deutscher  Plan:  Continue antibiotics, PICC/TNA, await return of bowel function. He is getting some meds PO, if he has any issues with this I would switch him over to IV.    Called after lunch for pain.  No acute finding, film shows ileus, Rx with pain meds.  Added IV tylenol so he  would have something long acting.  Changed digoxin to IV.  LOS: 3 days    Nasira Janusz 09/11/2019 Please see Amion

## 2019-09-11 NOTE — Progress Notes (Signed)
NIF -33 , VC 1.72 Lpm Best of 3 efforts.

## 2019-09-11 NOTE — Progress Notes (Signed)
Initial Nutrition Assessment  INTERVENTION:   -TPN management per Pharmacy -Will monitor for diet advancement  NUTRITION DIAGNOSIS:   Inadequate oral intake related to inability to eat (post-op ileus) as evidenced by NPO status.  GOAL:   Patient will meet greater than or equal to 90% of their needs  MONITOR:   Labs, Weight trends, Diet advancement, I & O's (TPN)  REASON FOR ASSESSMENT:   Consult New TPN/TNA  ASSESSMENT:   74 year old male with a history of congenital intestinal malrotation, A. fib on Eliquis, myasthenia gravis on CellCept, hypertension, hyperlipidemia, GERD presented to ED with fever, abdominal pain and distention.  Patient reported that he had a urological procedure done on Monday, 7/27 at [facility] urology and Foley catheter was placed.  He was advised to come to ED if he spiked fevers or had abdominal pain or distention.  Patient presented to ED with all the symptoms and abdominal pain, LLQ 8/10 with nausea.  Patient reported that his neurologist, Dr Terrace Arabia tried tapering down the dose of his CellCept 6 months ago but he became symptomatic so the dose has to be increased again.  7/29: s/p Laparoscopic appendectomy with closure of colon perforations  Patient unavailable when RD tried to visit x 2. Pt has had PICC placed. Per Pharmacy note, TPN to begin at 40 ml/hr (providing 901 kcals and 49g protein).   Per chart review, pt was having feelings of fullness r/t abdominal distention. Pt has been NPO since admission on 7/27. Pt reported to surgery some coughing following eating.   Per weight records, weights have been trending up since 4/4.  Medications reviewed. Labs reviewed:  Low Na  NUTRITION - FOCUSED PHYSICAL EXAM:  Unable to complete.  Diet Order:   Diet Order            Diet NPO time specified Except for: Sips with Meds, Other (See Comments), Ice Chips  Diet effective now                 EDUCATION NEEDS:   Not appropriate for education at  this time  Skin:  Skin Assessment: Reviewed RN Assessment  Last BM:  7/26  Height:   Ht Readings from Last 1 Encounters:  09/08/19 5\' 7"  (1.702 m)    Weight:   Wt Readings from Last 1 Encounters:  09/08/19 89.4 kg    BMI:  Body mass index is 30.85 kg/m.  Estimated Nutritional Needs:   Kcal:  1850-2050  Protein:  80-95g  Fluid:  2L/day   09/10/19, MS, RD, LDN Inpatient Clinical Dietitian Contact information available via Amion

## 2019-09-11 NOTE — Progress Notes (Signed)
PHARMACY - TOTAL PARENTERAL NUTRITION CONSULT NOTE   Indication: intolerance to enteral feeding  Patient Measurements: Height: 5\' 7"  (170.2 cm) Weight: 89.4 kg (197 lb) IBW/kg (Calculated) : 66.1 TPN AdjBW (KG): 71.9 Body mass index is 30.85 kg/m. Usual Weight:   Assessment: 74 year old male with a history of congenital intestinal malrotation, A. fib on Eliquis, myasthenia gravis on CellCept, hypertension, hyperlipidemia ,BPH s/p BPH procedure 7/26 Dr. 8/26 with indwelling Foley catheter,Ascending aortic aneurysm,GERD presented to ED with fever, abdominal pain and distention  7/29 s/p lap appy for acute gangrenous appendicitis with perforation at the base of the appendix, purulent peritonitis Pharmacy consulted to manage TPN 7/30 for intolerance to enteral feeding  Glucose / Insulin: no hx DM, preop glucose WNL, AM serum glucose 151 - got dexamethasone 5 mg 7/29 Electrolytes: Na sl low at 134, Ca sl low at 8.3 Renal: WNL LFTs / TGs: LFTs WNL on admit x T bili 1.9> 1.7 Prealbumin / albumin: alb 3.6 on admit Intake / Output; MIVF:  -160/24 hrs, 1700 urine UOP/24 hrs, 110/24 drain OP GI Imaging: Surgeries / Procedures: 7/29 lap appy for acute gangrenous appendicitis with perforation at the base of the appendix, purulent peritonitis  Central access: PICC ordered 7/30 for TPN TPN start date: 7/30  Nutritional Goals (per RD recommendations: pending ): kCal: , Protein: , Fluid:  Goal TPN rate is 80 mL/hr (provides ~ 100 g of protein and 1800 kcals per day)  Current Nutrition:  NPO  Plan:  Start TPN at 81mL/hr at 1800 and assess tolerance This provides ~ 50 gm protein & ~ 900 Kcal Electrolytes in TPN: 72mEq/L of Na, 56mEq/L of K, 76mEq/L of Ca, 12mEq/L of Mg, and 62mmol/L of Phos. Cl:Ac ratio 1:1 Add standard MVI and trace elements to TPN Initiate Sensitive q6h SSI and adjust as needed  Monitor TPN labs on Mon/Thurs  12m, Pharm.D 09/11/2019 8:09 AM

## 2019-09-11 NOTE — Progress Notes (Signed)
[facility]                                                                              Patient Demographics  [name], is a 74 y.o. male, DOB - 09-05-1945, ENM:[id number]  Admit date - 09/08/2019   Admitting Physician John Giovanni, MD  Outpatient Primary MD for the patient is [name], MD  Outpatient specialists:   LOS - 3  days   Medical records reviewed and are as summarized below:    Chief Complaint  Patient presents with  . Post-op Problem       Brief summary   Patient is a 74 year old male with a history of congenital intestinal malrotation, A. fib on Eliquis, myasthenia gravis on CellCept, hypertension, hyperlipidemia, GERD presented to ED with fever, abdominal pain and distention.  Patient reported that he had a urological procedure done on Monday, 7/27 at [facility] and Foley catheter was placed.  He was advised to come to ED if he spiked fevers or had abdominal pain or distention.  Patient presented to ED with all the symptoms and abdominal pain, LLQ 8/10 with nausea.  Patient reported that his neurologist, Dr Terrace Arabia tried tapering down the dose of his CellCept 6 months ago but he became symptomatic so the dose has to be increased again.  Since then he has been stable.  No new weakness, double vision, or difficulty with speech/swallowing.  He has been coughing a little for the past few days.  Showed acute appendicitis in the   Assessment & Plan    Principal Problem: Sepsis secondary to acute appendicitis, LLQ -Confirmed on CT with history of congenital intestinal malrotation.  Presented with tachycardia, fever, leukocytosis, source likely due to acute appendicitis.  Lactic acid normal -Continue IV fluids, IV Zosyn -Status post laparoscopic appendectomy for acute gangrenous appendicitis with perforation, purulent peritonitis, postop day #1 -Patient reexamined after complaining of worsening abdominal pain, stat abdominal x-ray  showed likely postop ileus.  Passing flatus this a.m. will defer management to general surgery -Continue n.p.o. status,  starting TPN today  Active Problems: Chronic atrial fibrillation with RVR -Rate controlled, continue digoxin, changed to IV - Continue to hold Eliquis until cleared by general surgery  -Appreciate cardiology recommendation, had a recent nuclear stress test, echo 03/2018 had shown EF of 60 to 65%     Myasthenia gravis (HCC) - Continue CellCept, patient reports that he has missed Mestinon for 3 days.  Typically takes it 60 mg as needed since CellCept was started and he had been doing well. - Continue to hold ramipril. Resumed Mestinon. Hold CellCept due to active infection - Neurology following, appreciate recommendations    Hypertension -Continue triamterene HCTZ   BPH -Per patient, had urological procedure on 7/27,, urology, Dr. Mena Goes -Remove Foley today, voiding trial  Obesity Estimated body mass index is 30.85 kg/m as calculated from the following:   Height as of this encounter: 5\' 7"  (1.702 m).   Weight as of this encounter: 89.4 kg.  Code Status: Full code DVT Prophylaxis: Heparin subcu Family Communication: Discussed all imaging results, lab results, explained to the patient and patient's wife on  phone on 7/29   Disposition Plan:     Status is: Inpatient  Remains inpatient appropriate because:Inpatient level of care appropriate due to severity of illness   Dispo: The patient is from: Home              Anticipated d/c is to: Home              Anticipated d/c date is: 2 days              Patient currently is not medically stable to d/c.  Not stable for discharge, postop ileus    Time Spent in minutes 25 minutes  Procedures:  None  Consultants:   General surgery Neurology  Antimicrobials:   Anti-infectives (From admission, onward)   Start     Dose/Rate Route Frequency Ordered Stop   09/09/19 0600  piperacillin-tazobactam (ZOSYN) IVPB  3.375 g     Discontinue     3.375 g 12.5 mL/hr over 240 Minutes Intravenous Every 8 hours 09/09/19 0142     09/08/19 2245  piperacillin-tazobactam (ZOSYN) IVPB 3.375 g        3.375 g 100 mL/hr over 30 Minutes Intravenous  Once 09/08/19 2233 09/09/19 0032         Medications  Scheduled Meds: . atorvastatin  20 mg Oral Daily  . Chlorhexidine Gluconate Cloth  6 each Topical Daily  . digoxin  0.25 mg Intravenous Daily  . heparin  5,000 Units Subcutaneous Q8H  . [START ON 09/12/2019] insulin aspart  0-9 Units Subcutaneous Q6H  . pyridostigmine  60 mg Oral TID  . sodium chloride flush  10-40 mL Intracatheter Q12H  . tamsulosin  0.4 mg Oral Daily  . triamterene-hydrochlorothiazide  1.5 tablet Oral Daily   Continuous Infusions: . acetaminophen    . methocarbamol (ROBAXIN) IV    . piperacillin-tazobactam (ZOSYN)  IV 3.375 g (09/11/19 1356)  . TPN ADULT (ION)     PRN Meds:.methocarbamol (ROBAXIN) IV, morphine injection, oxyCODONE, sodium chloride flush      Subjective:   [name] was seen and examined today.  Seen and examined twice, in a.m. had no significant complaints however around lunchtime started having lower abdominal pain, 7/10, no acute nausea or vomiting.  Afebrile.  No chest pain or shortness of breath  Objective:   Vitals:   09/11/19 0600 09/11/19 0949 09/11/19 1122 09/11/19 1402  BP: (!) 126/89 (!) 120/91 (!) 131/94 (!) 132/89  Pulse: 81 79 85 85  Resp: 17 16 16 20   Temp: 98.6 F (37 C) 97.9 F (36.6 C) 97.8 F (36.6 C) 97.9 F (36.6 C)  TempSrc:  Oral Oral Oral  SpO2: 99% 97% 98% 98%  Weight:      Height:        Intake/Output Summary (Last 24 hours) at 09/11/2019 1708 Last data filed at 09/11/2019 1356 Gross per 24 hour  Intake 0 ml  Output 2205 ml  Net -2205 ml     Wt Readings from Last 3 Encounters:  09/08/19 89.4 kg  09/03/19 89.7 kg  08/05/19 88.5 kg   Physical Exam  General: Alert and oriented x 3, NAD  Cardiovascular: S1 S2  clear, RRR. No pedal edema b/l  Respiratory: CTAB, no wheezing, rales or rhonchi  Gastrointestinal: Soft, tender in lower quadrants, high-pitched BS   Ext: no pedal edema bilaterally  Neuro: no new deficits  Musculoskeletal: No cyanosis, clubbing  Skin: No rashes  Psych: Normal affect and demeanor, alert and oriented x3  Data Reviewed:  I have personally reviewed following labs and imaging studies  Micro Results Recent Results (from the past 240 hour(s))  Urine C&S     Status: Abnormal   Collection Time: 09/08/19  8:22 PM   Specimen: Urine, Catheterized  Result Value Ref Range Status   Specimen Description   Final    URINE, CATHETERIZED Performed at [facility], [address], [place], [place] [address]    Special Requests NONE  Final   Culture (A)  Final    <10,000 COLONIES/mL INSIGNIFICANT GROWTH Performed at [facility], [address] [place], [place] [address]    Report Status 09/10/2019 FINAL  Final  SARS Coronavirus 2 by RT PCR (hospital order, performed in [facility] hospital lab) Nasopharyngeal Nasopharyngeal Swab     Status: None   Collection Time: 09/08/19 11:30 PM   Specimen: Nasopharyngeal Swab  Result Value Ref Range Status   SARS Coronavirus 2 NEGATIVE NEGATIVE Final    Comment: (NOTE) SARS-CoV-2 target nucleic acids are NOT DETECTED.  The SARS-CoV-2 RNA is generally detectable in upper and lower respiratory specimens during the acute phase of infection. The lowest concentration of SARS-CoV-2 viral copies this assay can detect is 250 copies / mL. A negative result does not preclude SARS-CoV-2 infection and should not be used as the sole basis for treatment or other patient management decisions.  A negative result may occur with improper specimen collection / handling, submission of specimen other than nasopharyngeal swab, presence of viral mutation(s) within the areas targeted by this assay, and inadequate number  of viral copies (<250 copies / mL). A negative result must be combined with clinical observations, patient history, and epidemiological information.  Fact Sheet for Patients:   BoilerBrush.com.cy  Fact Sheet for Healthcare Providers: https://pope.com/  This test is not yet approved or  cleared by the [place] FDA and has been authorized for detection and/or diagnosis of SARS-CoV-2 by FDA under an Emergency Use Authorization (EUA).  This EUA will remain in effect (meaning this test can be used) for the duration of the COVID-19 declaration under Section 564(b)(1) of the Act, 21 U.S.C. section 360bbb-3(b)(1), unless the authorization is terminated or revoked sooner.  Performed at [facility], [address] [place], [place] [address]   Surgical PCR screen     Status: None   Collection Time: 09/10/19  4:32 AM   Specimen: Nasal Mucosa; Nasal Swab  Result Value Ref Range Status   MRSA, PCR NEGATIVE NEGATIVE Final   Staphylococcus aureus NEGATIVE NEGATIVE Final    Comment: (NOTE) The Xpert SA Assay (FDA approved for NASAL specimens in patients 69 years of age and older), is one component of a comprehensive surveillance program. It is not intended to diagnose infection nor to guide or monitor treatment. Performed at [facility], [address], [place], [place] [address]     Radiology Reports CT ABDOMEN PELVIS WO CONTRAST  Result Date: 09/08/2019 CLINICAL DATA:  Fever and abdominal distension, history of recent prostate procedure EXAM: CT ABDOMEN AND PELVIS WITHOUT CONTRAST TECHNIQUE: Multidetector CT imaging of the abdomen and pelvis was performed following the standard protocol without IV contrast. COMPARISON:  05/17/2019 FINDINGS: Lower chest: Lung bases are clear. Previously seen nodular density in the right lower lobe represent some focal eventration of the diaphragm. No other focal  abnormality is noted. Hepatobiliary: No focal liver abnormality is seen. No gallstones, gallbladder wall thickening, or biliary dilatation. Pancreas: Unremarkable. No pancreatic  ductal dilatation or surrounding inflammatory changes. Spleen: Normal in size without focal abnormality. Adrenals/Urinary Tract: Adrenal glands demonstrate nodularity stable from the prior exam. No renal calculi are seen. Hypodensity in the left mid kidney is noted consistent with cyst. The bladder is decompressed by Foley catheter. Stomach/Bowel: Non rotation of the bowel is noted with the cecum in the left lower quadrant and the majority of the small bowel on the right. The appendix is visualized but small. Mild dilatation is seen with mild inflammatory changes suspicious for acute appendicitis. Correlate with laboratory values. No other focal bowel abnormality is noted. Vascular/Lymphatic: Aortic atherosclerosis. No enlarged abdominal or pelvic lymph nodes. Reproductive: Prostate is enlarged. Other: No abdominal wall hernia or abnormality. No abdominopelvic ascites. Musculoskeletal: Degenerative changes of lumbar spine are noted. No acute abnormality is seen. IMPRESSION: Changes suspicious for acute appendicitis in the left lower quadrant due to non rotation of the bowel. These changes have progressed in the interval from the prior exam. The appendix measures approximately 14 mm in greatest dimension. No abscess is seen. Chronic changes stable from the previous exam. Electronically Signed   By: Alcide Clever M.D.   On: 09/08/2019 21:58   DG Chest Port 1 View  Result Date: 09/11/2019 CLINICAL DATA:  Status post PICC line placement. EXAM: PORTABLE CHEST 1 VIEW COMPARISON:  September 09, 2019. FINDINGS: The heart size and mediastinal contours are within normal limits. Both lungs are clear. Interval placement of right-sided PICC line with distal tip in expected position of the SVC. The visualized skeletal structures are unremarkable.  IMPRESSION: Interval placement of right-sided PICC line with distal tip in expected position of the SVC. Electronically Signed   By: Lupita Raider M.D.   On: 09/11/2019 09:54   DG CHEST PORT 1 VIEW  Result Date: 09/09/2019 CLINICAL DATA:  Increased shortness of breath this morning EXAM: PORTABLE CHEST 1 VIEW COMPARISON:  09/20/2016 FINDINGS: No cardiomegaly. Borderline vascular congestion. No edema, consolidation, effusion, or pneumothorax. IMPRESSION: Borderline vascular congestion. Electronically Signed   By: Marnee Spring M.D.   On: 09/09/2019 07:27   DG Abd Portable 2V  Result Date: 09/11/2019 CLINICAL DATA:  Postop abdominal pain. Laparoscopic appendectomy yesterday. EXAM: PORTABLE ABDOMEN - 2 VIEW COMPARISON:  Abdominal CT 09/08/2019 FINDINGS: The lung bases are clear. Mild gaseous distension of small and large bowel in a nonobstructive pattern. Colon appears in the left abdomen with small bowel in the right consistent with history of bowel malrotation. There scattered skin staples. No large volume free intra-abdominal air. Stable osseous structures. IMPRESSION: Mild gaseous distension of small and large bowel in a nonobstructive pattern, likely postoperative ileus. Electronically Signed   By: Narda Rutherford M.D.   On: 09/11/2019 14:54   [place] EKG SITE RITE  Result Date: 09/11/2019 If Site Rite image not attached, placement could not be confirmed due to current cardiac rhythm.   Lab Data:  CBC: Recent Labs  Lab 09/08/19 2224 09/08/19 2233 09/09/19 0513 09/10/19 0301 09/11/19 0304  WBC 15.8*  --  15.7* 11.7* 9.0  NEUTROABS 14.8*  --   --  10.3*  --   HGB 15.9 16.3 15.4 14.6 15.2  HCT 47.4 48.0 47.0 45.2 46.8  MCV 97.1  --  99.4 100.7* 99.4  PLT 143*  --  128* 137* 166   Basic Metabolic Panel: Recent Labs  Lab 09/08/19 2224 09/08/19 2233 09/10/19 0301 09/11/19 0304  NA 136 138 137 134*  K 3.9 3.7 3.8 3.7  CL 97* 97* 101  99  CO2 26  --  27 24  GLUCOSE 112* 108* 84  151*  BUN CREATININE 0.64 0.80 0.96 0.76  CALCIUM 8.9  --  8.3* 8.3*   GFR: Estimated Creatinine Clearance: 85.1 mL/min (by C-G formula based on SCr of 0.76 mg/dL). Liver Function Tests: Recent Labs  Lab 09/08/19 2224 09/09/19 0513  AST 25  --   ALT 23  --   ALKPHOS 62  --   BILITOT 1.9* 1.7*  PROT 6.8  --   ALBUMIN 3.6  --    Recent Labs  Lab 09/08/19 2224  LIPASE 26   No results for input(s): AMMONIA in the last 168 hours. Coagulation Profile: No results for input(s): INR, PROTIME in the last 168 hours. Cardiac Enzymes: No results for input(s): CKTOTAL, CKMB, CKMBINDEX, TROPONINI in the last 168 hours. BNP (last 3 results) No results for input(s): PROBNP in the last 8760 hours. HbA1C: No results for input(s): HGBA1C in the last 72 hours. CBG: Recent Labs  Lab 09/10/19 0942  GLUCAP 88   Lipid Profile: No results for input(s): CHOL, HDL, LDLCALC, TRIG, CHOLHDL, LDLDIRECT in the last 72 hours. Thyroid Function Tests: No results for input(s): TSH, T4TOTAL, FREET4, T3FREE, THYROIDAB in the last 72 hours. Anemia Panel: No results for input(s): VITAMINB12, FOLATE, FERRITIN, TIBC, IRON, RETICCTPCT in the last 72 hours. Urine analysis:    Component Value Date/Time   COLORURINE YELLOW 09/08/2019 2022   APPEARANCEUR HAZY (A) 09/08/2019 2022   LABSPEC 1.025 09/08/2019 2022   PHURINE 6.0 09/08/2019 2022   GLUCOSEU NEGATIVE 09/08/2019 2022   HGBUR LARGE (A) 09/08/2019 2022   BILIRUBINUR SMALL (A) 09/08/2019 2022   KETONESUR 40 (A) 09/08/2019 2022   PROTEINUR >300 (A) 09/08/2019 2022   UROBILINOGEN 0.2 07/05/2007 2323   NITRITE NEGATIVE 09/08/2019 2022   LEUKOCYTESUR TRACE (A) 09/08/2019 2022     Lameshia Hypolite M.D. [facility] 09/11/2019, 5:08 PM   Call night coverage person covering after 7pm

## 2019-09-11 NOTE — Progress Notes (Signed)
09/11/2019  Dr Isidoro Donning at bedside

## 2019-09-11 NOTE — Progress Notes (Signed)
Peripherally Inserted Central Catheter Placement  The IV Nurse has discussed with the patient and/or persons authorized to consent for the patient, the purpose of this procedure and the potential benefits and risks involved with this procedure.  The benefits include less needle sticks, lab draws from the catheter, and the patient may be discharged home with the catheter. Risks include, but not limited to, infection, bleeding, blood clot (thrombus formation), and puncture of an artery; nerve damage and irregular heartbeat and possibility to perform a PICC exchange if needed/ordered by physician.  Alternatives to this procedure were also discussed.  Bard Power PICC patient education guide, fact sheet on infection prevention and patient information card has been provided to patient /or left at bedside.    PICC Placement Documentation  PICC Double Lumen 09/11/19 PICC Right Basilic 41 cm 0 cm (Active)  Indication for Insertion or Continuance of Line Administration of hyperosmolar/irritating solutions (i.e. TPN, Vancomycin, etc.) 09/11/19 0930  Exposed Catheter (cm) 0 cm 09/11/19 0930  Site Assessment Clean;Dry;Intact 09/11/19 0930  Lumen #1 Status Flushed;Blood return noted 09/11/19 0930  Lumen #2 Status Flushed;Blood return noted 09/11/19 0930  Dressing Type Transparent 09/11/19 0930  Dressing Status Clean;Dry;Intact;Antimicrobial disc in place;Other (Comment) 09/11/19 0930  Dressing Intervention New dressing 09/11/19 0930  Dressing Change Due 09/18/19 09/11/19 0930       Reginia Forts Albarece 09/11/2019, 9:32 AM

## 2019-09-11 NOTE — Progress Notes (Signed)
09/11/2019  1315  Notified Dr Isidoro Donning of patients c/o abdominal pain and distention. Per MD notify general surgery. Will NP with General surgery notified waiting for call back.

## 2019-09-12 DIAGNOSIS — R1010 Upper abdominal pain, unspecified: Secondary | ICD-10-CM

## 2019-09-12 LAB — CBC
HCT: 41.6 % (ref 39.0–52.0)
Hemoglobin: 13.6 g/dL (ref 13.0–17.0)
MCH: 32.1 pg (ref 26.0–34.0)
MCHC: 32.7 g/dL (ref 30.0–36.0)
MCV: 98.1 fL (ref 80.0–100.0)
Platelets: 165 10*3/uL (ref 150–400)
RBC: 4.24 MIL/uL (ref 4.22–5.81)
RDW: 13.1 % (ref 11.5–15.5)
WBC: 7.9 10*3/uL (ref 4.0–10.5)
nRBC: 0 % (ref 0.0–0.2)

## 2019-09-12 LAB — COMPREHENSIVE METABOLIC PANEL
ALT: 18 U/L (ref 0–44)
AST: 21 U/L (ref 15–41)
Albumin: 2.7 g/dL — ABNORMAL LOW (ref 3.5–5.0)
Alkaline Phosphatase: 44 U/L (ref 38–126)
Anion gap: 12 (ref 5–15)
BUN: 23 mg/dL (ref 8–23)
CO2: 27 mmol/L (ref 22–32)
Calcium: 8.5 mg/dL — ABNORMAL LOW (ref 8.9–10.3)
Chloride: 100 mmol/L (ref 98–111)
Creatinine, Ser: 0.65 mg/dL (ref 0.61–1.24)
GFR calc Af Amer: >60 mL/min (ref >60)
GFR calc non Af Amer: >60 mL/min (ref >60)
Glucose, Bld: 131 mg/dL — ABNORMAL HIGH (ref 70–99)
Potassium: 3.2 mmol/L — ABNORMAL LOW (ref 3.5–5.1)
Sodium: 139 mmol/L (ref 135–145)
Total Bilirubin: 0.6 mg/dL (ref 0.3–1.2)
Total Protein: 5.6 g/dL — ABNORMAL LOW (ref 6.5–8.1)

## 2019-09-12 LAB — GLUCOSE, CAPILLARY
Glucose-Capillary: 138 mg/dL — ABNORMAL HIGH (ref 70–99)
Glucose-Capillary: 143 mg/dL — ABNORMAL HIGH (ref 70–99)
Glucose-Capillary: 148 mg/dL — ABNORMAL HIGH (ref 70–99)

## 2019-09-12 LAB — PHOSPHORUS: Phosphorus: 2.3 mg/dL — ABNORMAL LOW (ref 2.5–4.6)

## 2019-09-12 LAB — MAGNESIUM: Magnesium: 2 mg/dL (ref 1.7–2.4)

## 2019-09-12 LAB — DIFFERENTIAL
Abs Immature Granulocytes: 0.06 10*3/uL (ref 0.00–0.07)
Basophils Absolute: 0 10*3/uL (ref 0.0–0.1)
Basophils Relative: 1 %
Eosinophils Absolute: 0.2 10*3/uL (ref 0.0–0.5)
Eosinophils Relative: 3 %
Immature Granulocytes: 1 %
Lymphocytes Relative: 11 %
Lymphs Abs: 0.9 10*3/uL (ref 0.7–4.0)
Monocytes Absolute: 0.7 10*3/uL (ref 0.1–1.0)
Monocytes Relative: 9 %
Neutro Abs: 6 10*3/uL (ref 1.7–7.7)
Neutrophils Relative %: 75 %

## 2019-09-12 LAB — TRIGLYCERIDES: Triglycerides: 70 mg/dL (ref <150)

## 2019-09-12 MED ORDER — TRAVASOL 10 % IV SOLN
INTRAVENOUS | Status: AC
  Filled 2019-09-12: qty 998.4

## 2019-09-12 MED ORDER — POTASSIUM CHLORIDE 10 MEQ/100ML IV SOLN
10.0000 meq | INTRAVENOUS | Status: AC
  Administered 2019-09-12 (×3): 10 meq via INTRAVENOUS
  Filled 2019-09-12 (×2): qty 100

## 2019-09-12 MED ORDER — POTASSIUM CHLORIDE 10 MEQ/100ML IV SOLN
INTRAVENOUS | Status: AC
  Filled 2019-09-12: qty 100

## 2019-09-12 MED ORDER — POTASSIUM PHOSPHATES 15 MMOLE/5ML IV SOLN
15.0000 mmol | Freq: Once | INTRAVENOUS | Status: AC
  Administered 2019-09-12: 15 mmol via INTRAVENOUS
  Filled 2019-09-12: qty 5

## 2019-09-12 NOTE — Progress Notes (Signed)
[facility]                                                                              Patient Demographics  [name], is a 74 y.o. male, DOB - 1945-04-03, ZOX:[id number]  Admit date - 09/08/2019   Admitting Physician John Giovanni, MD  Outpatient Primary MD for the patient is [name], MD  Outpatient specialists:   LOS - 4  days   Medical records reviewed and are as summarized below:    Chief Complaint  Patient presents with  . Post-op Problem       Brief summary   Patient is a 74 year old male with a history of congenital intestinal malrotation, A. fib on Eliquis, myasthenia gravis on CellCept, hypertension, hyperlipidemia, GERD presented to ED with fever, abdominal pain and distention.  Patient reported that he had a urological procedure done on Monday, 7/27 at [facility] urology and Foley catheter was placed.  He was advised to come to ED if he spiked fevers or had abdominal pain or distention.  Patient presented to ED with all the symptoms and abdominal pain, LLQ 8/10 with nausea.  Patient reported that his neurologist, Dr Terrace Arabia tried tapering down the dose of his CellCept 6 months ago but he became symptomatic so the dose has to be increased again.  Since then he has been stable.  No new weakness, double vision, or difficulty with speech/swallowing.  He has been coughing a little for the past few days.  Showed acute appendicitis in the   Assessment & Plan    Principal Problem: Sepsis secondary to acute appendicitis, LLQ with postop ileus -Confirmed on CT with history of congenital intestinal malrotation.  Presented with tachycardia, fever, leukocytosis, source likely due to acute appendicitis.  Lactic acid normal -Status post laparoscopic appendectomy for acute gangrenous appendicitis with perforation, purulent peritonitis, postop day #2 -Continue IV Zosyn, n.p.o. status, started on IV TPN  Active Problems: Chronic atrial fibrillation  with RVR -Rate controlled, continue digoxin, changed to IV -Currently on heparin subcu, holding Eliquis until cleared by general surgery -Appreciate cardiology recommendation, had a recent nuclear stress test, echo 03/2018 had shown EF of 60 to 65%     Myasthenia gravis (HCC) - Continue CellCept, patient reports that he has missed Mestinon for 3 days.  Typically takes it 60 mg as needed since CellCept was started and he had been doing well. - Continue to hold ramipril. Resumed Mestinon. Hold CellCept due to active infection -Currently no acute crisis neurology has evaluated    Hypertension -Continue triamterene HCTZ   BPH -Per patient, had urological procedure on 7/27,, urology, Dr. Mena Goes -Foley removed on 7/30  Obesity Estimated body mass index is 30.85 kg/m as calculated from the following:   Height as of this encounter: 5\' 7"  (1.702 m).   Weight as of this encounter: 89.4 kg.  Code Status: Full code DVT Prophylaxis: Heparin subcu Family Communication: Discussed all imaging results, lab results, explained to the patient    Disposition Plan:     Status is: Inpatient  Remains inpatient appropriate because:Inpatient level of care appropriate due to severity of illness   Dispo:  The patient is from: Home              Anticipated d/c is to: Home              Anticipated d/c date is: 2 days              Patient currently is not medically stable to d/c.  Not stable for discharge, postop ileus    Time Spent in minutes 25 minutes  Procedures:  Laparoscopic appendectomy with closure of colon perforation, Blake drain placement  Consultants:   General surgery Neurology  Antimicrobials:   Anti-infectives (From admission, onward)   Start     Dose/Rate Route Frequency Ordered Stop   09/09/19 0600  piperacillin-tazobactam (ZOSYN) IVPB 3.375 g     Discontinue     3.375 g 12.5 mL/hr over 240 Minutes Intravenous Every 8 hours 09/09/19 0142     09/08/19 2245   piperacillin-tazobactam (ZOSYN) IVPB 3.375 g        3.375 g 100 mL/hr over 30 Minutes Intravenous  Once 09/08/19 2233 09/09/19 0032         Medications  Scheduled Meds: . atorvastatin  20 mg Oral Daily  . Chlorhexidine Gluconate Cloth  6 each Topical Daily  . digoxin  0.25 mg Intravenous Daily  . heparin  5,000 Units Subcutaneous Q8H  . insulin aspart  0-9 Units Subcutaneous Q6H  . pyridostigmine  60 mg Oral TID  . sodium chloride flush  10-40 mL Intracatheter Q12H  . tamsulosin  0.4 mg Oral Daily  . triamterene-hydrochlorothiazide  1.5 tablet Oral Daily   Continuous Infusions: . methocarbamol (ROBAXIN) IV    . piperacillin-tazobactam (ZOSYN)  IV 3.375 g (09/12/19 1336)  . potassium chloride    . potassium PHOSPHATE IVPB (in mmol) 15 mmol (09/12/19 1136)  . TPN ADULT (ION) 40 mL/hr at 09/11/19 2300  . TPN ADULT (ION)     PRN Meds:.methocarbamol (ROBAXIN) IV, morphine injection, oxyCODONE, sodium chloride flush      Subjective:   [name] was seen and examined today.  Still has lower abdominal pain, + flatus, no BM yet.  No fevers or chills.  No nausea vomiting, chest pain or shortness of breath.   Objective:   Vitals:   09/11/19 2048 09/12/19 0558 09/12/19 1016 09/12/19 1412  BP: (!) 131/97 115/85 124/81 128/80  Pulse: 56 79 62 62  Resp: Temp: 97.6 F (36.4 C) 97.6 F (36.4 C)  98.4 F (36.9 C)  TempSrc: Oral Oral  Oral  SpO2: 96% 99% 96% 99%  Weight:      Height:        Intake/Output Summary (Last 24 hours) at 09/12/2019 1457 Last data filed at 09/12/2019 1337 Gross per 24 hour  Intake 525.72 ml  Output 2030 ml  Net -1504.28 ml     Wt Readings from Last 3 Encounters:  09/08/19 89.4 kg  09/03/19 89.7 kg  08/05/19 88.5 kg    Physical Exam  General: Alert and oriented x 3, NAD  Cardiovascular: S1 S2 clear, RRR. No pedal edema b/l  Respiratory: CTAB, no wheezing, rales or rhonchi  Gastrointestinal: Soft, lower abdominal  tenderness, nondistended, NBS  Ext: no pedal edema bilaterally  Neuro: no new deficits  Musculoskeletal: No cyanosis, clubbing  Skin: No rashes  Psych: Normal affect and demeanor, alert and oriented x3       Data Reviewed:  I have personally reviewed following labs and imaging studies  Micro Results  Recent Results (from the past 240 hour(s))  Urine C&S     Status: Abnormal   Collection Time: 09/08/19  8:22 PM   Specimen: Urine, Catheterized  Result Value Ref Range Status   Specimen Description   Final    URINE, CATHETERIZED Performed at [facility], [address] [place], [place] [address]    Special Requests NONE  Final   Culture (A)  Final    <10,000 COLONIES/mL INSIGNIFICANT GROWTH Performed at [facility], [address], [place], [place] [address]    Report Status 09/10/2019 FINAL  Final  SARS Coronavirus 2 by RT PCR (hospital order, performed in [facility] hospital lab) Nasopharyngeal Nasopharyngeal Swab     Status: None   Collection Time: 09/08/19 11:30 PM   Specimen: Nasopharyngeal Swab  Result Value Ref Range Status   SARS Coronavirus 2 NEGATIVE NEGATIVE Final    Comment: (NOTE) SARS-CoV-2 target nucleic acids are NOT DETECTED.  The SARS-CoV-2 RNA is generally detectable in upper and lower respiratory specimens during the acute phase of infection. The lowest concentration of SARS-CoV-2 viral copies this assay can detect is 250 copies / mL. A negative result does not preclude SARS-CoV-2 infection and should not be used as the sole basis for treatment or other patient management decisions.  A negative result may occur with improper specimen collection / handling, submission of specimen other than nasopharyngeal swab, presence of viral mutation(s) within the areas targeted by this assay, and inadequate number of viral copies (<250 copies / mL). A negative result must be combined with clinical observations, patient history, and  epidemiological information.  Fact Sheet for Patients:   BoilerBrush.com.cy  Fact Sheet for Healthcare Providers: https://pope.com/  This test is not yet approved or  cleared by the [place] FDA and has been authorized for detection and/or diagnosis of SARS-CoV-2 by FDA under an Emergency Use Authorization (EUA).  This EUA will remain in effect (meaning this test can be used) for the duration of the COVID-19 declaration under Section 564(b)(1) of the Act, 21 U.S.C. section 360bbb-3(b)(1), unless the authorization is terminated or revoked sooner.  Performed at [facility], [address], [place], [place] [address]   Surgical PCR screen     Status: None   Collection Time: 09/10/19  4:32 AM   Specimen: Nasal Mucosa; Nasal Swab  Result Value Ref Range Status   MRSA, PCR NEGATIVE NEGATIVE Final   Staphylococcus aureus NEGATIVE NEGATIVE Final    Comment: (NOTE) The Xpert SA Assay (FDA approved for NASAL specimens in patients 33 years of age and older), is one component of a comprehensive surveillance program. It is not intended to diagnose infection nor to guide or monitor treatment. Performed at [facility], [address], [place], [place] [address]     Radiology Reports CT ABDOMEN PELVIS WO CONTRAST  Result Date: 09/08/2019 CLINICAL DATA:  Fever and abdominal distension, history of recent prostate procedure EXAM: CT ABDOMEN AND PELVIS WITHOUT CONTRAST TECHNIQUE: Multidetector CT imaging of the abdomen and pelvis was performed following the standard protocol without IV contrast. COMPARISON:  05/17/2019 FINDINGS: Lower chest: Lung bases are clear. Previously seen nodular density in the right lower lobe represent some focal eventration of the diaphragm. No other focal abnormality is noted. Hepatobiliary: No focal liver abnormality is seen. No gallstones, gallbladder wall thickening, or biliary  dilatation. Pancreas: Unremarkable. No pancreatic ductal dilatation or surrounding inflammatory changes. Spleen: Normal in size without focal abnormality. Adrenals/Urinary Tract:  Adrenal glands demonstrate nodularity stable from the prior exam. No renal calculi are seen. Hypodensity in the left mid kidney is noted consistent with cyst. The bladder is decompressed by Foley catheter. Stomach/Bowel: Non rotation of the bowel is noted with the cecum in the left lower quadrant and the majority of the small bowel on the right. The appendix is visualized but small. Mild dilatation is seen with mild inflammatory changes suspicious for acute appendicitis. Correlate with laboratory values. No other focal bowel abnormality is noted. Vascular/Lymphatic: Aortic atherosclerosis. No enlarged abdominal or pelvic lymph nodes. Reproductive: Prostate is enlarged. Other: No abdominal wall hernia or abnormality. No abdominopelvic ascites. Musculoskeletal: Degenerative changes of lumbar spine are noted. No acute abnormality is seen. IMPRESSION: Changes suspicious for acute appendicitis in the left lower quadrant due to non rotation of the bowel. These changes have progressed in the interval from the prior exam. The appendix measures approximately 14 mm in greatest dimension. No abscess is seen. Chronic changes stable from the previous exam. Electronically Signed   By: Alcide Clever M.D.   On: 09/08/2019 21:58   DG Chest Port 1 View  Result Date: 09/11/2019 CLINICAL DATA:  Status post PICC line placement. EXAM: PORTABLE CHEST 1 VIEW COMPARISON:  September 09, 2019. FINDINGS: The heart size and mediastinal contours are within normal limits. Both lungs are clear. Interval placement of right-sided PICC line with distal tip in expected position of the SVC. The visualized skeletal structures are unremarkable. IMPRESSION: Interval placement of right-sided PICC line with distal tip in expected position of the SVC. Electronically Signed   By: Lupita Raider M.D.   On: 09/11/2019 09:54   DG CHEST PORT 1 VIEW  Result Date: 09/09/2019 CLINICAL DATA:  Increased shortness of breath this morning EXAM: PORTABLE CHEST 1 VIEW COMPARISON:  09/20/2016 FINDINGS: No cardiomegaly. Borderline vascular congestion. No edema, consolidation, effusion, or pneumothorax. IMPRESSION: Borderline vascular congestion. Electronically Signed   By: Marnee Spring M.D.   On: 09/09/2019 07:27   DG Abd Portable 2V  Result Date: 09/11/2019 CLINICAL DATA:  Postop abdominal pain. Laparoscopic appendectomy yesterday. EXAM: PORTABLE ABDOMEN - 2 VIEW COMPARISON:  Abdominal CT 09/08/2019 FINDINGS: The lung bases are clear. Mild gaseous distension of small and large bowel in a nonobstructive pattern. Colon appears in the left abdomen with small bowel in the right consistent with history of bowel malrotation. There scattered skin staples. No large volume free intra-abdominal air. Stable osseous structures. IMPRESSION: Mild gaseous distension of small and large bowel in a nonobstructive pattern, likely postoperative ileus. Electronically Signed   By: Narda Rutherford M.D.   On: 09/11/2019 14:54   [place] EKG SITE RITE  Result Date: 09/11/2019 If Site Rite image not attached, placement could not be confirmed due to current cardiac rhythm.   Lab Data:  CBC: Recent Labs  Lab 09/08/19 2224 09/08/19 2224 09/08/19 2233 09/09/19 0513 09/10/19 0301 09/11/19 0304 09/12/19 0424  WBC 15.8*  --   --  15.7* 11.7* 9.0 7.9  NEUTROABS 14.8*  --   --   --  10.3*  --  6.0  HGB 15.9   < > 16.3 15.4 14.6 15.2 13.6  HCT 47.4   < > 48.0 47.0 45.2 46.8 41.6  MCV 97.1  --   --  99.4 100.7* 99.4 98.1  PLT 143*  --   --  128* 137* 166 165   < > = values in this interval not displayed.   Basic Metabolic Panel: Recent Labs  Lab 09/08/19 2224 09/08/19 2233 09/10/19 0301 09/11/19 0304 09/12/19 0424  NA 136 138 137 134* 139  K 3.9 3.7 3.8 3.7 3.2*  CL 97* 97* 101 99 100  CO2 26  --  27 24  27   GLUCOSE 112* 108* 84 151* 131*  BUN 17 19 18 22 23   CREATININE 0.64 0.80 0.96 0.76 0.65  CALCIUM 8.9  --  8.3* 8.3* 8.5*  MG  --   --   --   --  2.0  PHOS  --   --   --   --  2.3*   GFR: Estimated Creatinine Clearance: 85.1 mL/min (by C-G formula based on SCr of 0.65 mg/dL). Liver Function Tests: Recent Labs  Lab 09/08/19 2224 09/09/19 0513 09/12/19 0424  AST 25  --  21  ALT 23  --  18  ALKPHOS 62  --  44  BILITOT 1.9* 1.7* 0.6  PROT 6.8  --  5.6*  ALBUMIN 3.6  --  2.7*   Recent Labs  Lab 09/08/19 2224  LIPASE 26   No results for input(s): AMMONIA in the last 168 hours. Coagulation Profile: No results for input(s): INR, PROTIME in the last 168 hours. Cardiac Enzymes: No results for input(s): CKTOTAL, CKMB, CKMBINDEX, TROPONINI in the last 168 hours. BNP (last 3 results) No results for input(s): PROBNP in the last 8760 hours. HbA1C: No results for input(s): HGBA1C in the last 72 hours. CBG: Recent Labs  Lab 09/10/19 0942 09/11/19 2340 09/12/19 0601 09/12/19 1211  GLUCAP 88 154* 138* 143*   Lipid Profile: Recent Labs    09/12/19 0424  TRIG 70   Thyroid Function Tests: No results for input(s): TSH, T4TOTAL, FREET4, T3FREE, THYROIDAB in the last 72 hours. Anemia Panel: No results for input(s): VITAMINB12, FOLATE, FERRITIN, TIBC, IRON, RETICCTPCT in the last 72 hours. Urine analysis:    Component Value Date/Time   COLORURINE YELLOW 09/08/2019 2022   APPEARANCEUR HAZY (A) 09/08/2019 2022   LABSPEC 1.025 09/08/2019 2022   PHURINE 6.0 09/08/2019 2022   GLUCOSEU NEGATIVE 09/08/2019 2022   HGBUR LARGE (A) 09/08/2019 2022   BILIRUBINUR SMALL (A) 09/08/2019 2022   KETONESUR 40 (A) 09/08/2019 2022   PROTEINUR >300 (A) 09/08/2019 2022   UROBILINOGEN 0.2 07/05/2007 2323   NITRITE NEGATIVE 09/08/2019 2022   LEUKOCYTESUR TRACE (A) 09/08/2019 2022     Kit Mollett M.D. [facility] 09/12/2019, 2:57 PM   Call night coverage person covering after  7pm

## 2019-09-12 NOTE — Progress Notes (Signed)
Respiratory Parameters obtained: NIF(-24cmh20)/FVC(1.85 lpm)

## 2019-09-12 NOTE — Progress Notes (Signed)
PHARMACY - TOTAL PARENTERAL NUTRITION CONSULT NOTE   Indication: intolerance to enteral feeding  Patient Measurements: Height: 5\' 7"  (170.2 cm) Weight: 89.4 kg (197 lb) IBW/kg (Calculated) : 66.1 TPN AdjBW (KG): 71.9 Body mass index is 30.85 kg/m. Usual Weight:   Assessment: 74 year old male with a history of congenital intestinal malrotation, A. fib on Eliquis, myasthenia gravis on CellCept, hypertension, hyperlipidemia ,BPH s/p BPH procedure 7/26 Dr. 8/26 with indwelling Foley catheter,Ascending aortic aneurysm,GERD presented to ED with fever, abdominal pain and distention  7/29 s/p lap appy for acute gangrenous appendicitis with perforation at the base of the appendix, purulent peritonitis Pharmacy consulted to manage TPN 7/30 for intolerance to enteral feeding  Glucose / Insulin: no hx DM, CBGs 131-154 on sSSI q 6h receiving 3 units/24h  - got dexamethasone 5 mg 7/29 Electrolytes: K 3.2, phos 2.3, others WNL Renal: WNL LFTs / TGs: LFTs WNL, TG WNL Prealbumin / albumin: alb 2.7 Intake / Output; MIVF:  1.7L net, no MIVF GI Imaging: Surgeries / Procedures: 7/29 lap appy for acute gangrenous appendicitis with perforation at the base of the appendix, purulent peritonitis Central access: 7/30 TPN start date: 7/30  Nutritional Goals (per RD recommendations: pending ): kCal: , Protein: , Fluid:  Goal TPN rate is 80 mL/hr (provides ~ 100 g of protein and 1800 kcals per day)  Current Nutrition:  NPO  Plan:  KCl 10 meq iv x 3 per MD, Kphos 15 mmol iv once  Increase TPN to goal at 29mL/hr at 1800  This provides ~ 100 gm protein & ~ 1800 Kcal Electrolytes in TPN: 87mEq/L of Na, 82mEq/L of K, 79mEq/L of Ca, 71mEq/L of Mg, and 97mmol/L of Phos. Cl:Ac ratio 1:1 Add standard MVI and trace elements to TPN Continue Sensitive q6h SSI and consider decreasing to q 8 h if CBGs remain WNL Monitor TPN labs on Mon/Thurs  12m, PharmD, BCPS 09/12/2019 9:44 AM

## 2019-09-12 NOTE — Progress Notes (Signed)
2 Days Post-Op    CC:  Abdominal pain  Subjective: +Flatus, no BM yet. Pain much better than in previous days.  Objective: Vital signs in last 24 hours: Temp:  [97.6 F (36.4 C)-97.9 F (36.6 C)] 97.6 F (36.4 C) (07/31 0558) Pulse Rate:  [56-85] 79 (07/31 0558) Resp:  [16-20] 20 (07/31 0558) BP: (115-132)/(85-97) 115/85 (07/31 0558) SpO2:  [96 %-99 %] 99 % (07/31 0558) Last BM Date: 09/11/19 N.p.o.  Urine 2L  JP drain 115 ss No BM  WBC 7.9   Intake/Output from previous day: 07/30 0701 - 07/31 0700 In: 525.7 [I.V.:225.7; IV Piggyback:300] Out: 2270 [Urine:2155; Drains:115] Intake/Output this shift: No intake/output data recorded.  General appearance: alert, cooperative and no distress Resp: clear to auscultation bilaterally GI: abdomen soft, minimally tender, not significantly distended; incisions c/d/i without erythema or drainage; jp ss  Lab Results:  Recent Labs    09/11/19 0304 09/12/19 0424  WBC 9.0 7.9  HGB 15.2 13.6  HCT 46.8 41.6  PLT 166 165    BMET Recent Labs    09/11/19 0304 09/12/19 0424  NA 134* 139  K 3.7 3.2*  CL 99 100  CO2 24 27  GLUCOSE 151* 131*  BUN 22 23  CREATININE 0.76 0.65  CALCIUM 8.3* 8.5*   PT/INR No results for input(s): LABPROT, INR in the last 72 hours.  Recent Labs  Lab 09/08/19 2224 09/09/19 0513 09/12/19 0424  AST 25  --  21  ALT 23  --  18  ALKPHOS 62  --  44  BILITOT 1.9* 1.7* 0.6  PROT 6.8  --  5.6*  ALBUMIN 3.6  --  2.7*     Lipase     Component Value Date/Time   LIPASE 26 09/08/2019 2224     Medications: . atorvastatin  20 mg Oral Daily  . Chlorhexidine Gluconate Cloth  6 each Topical Daily  . digoxin  0.25 mg Intravenous Daily  . heparin  5,000 Units Subcutaneous Q8H  . insulin aspart  0-9 Units Subcutaneous Q6H  . pyridostigmine  60 mg Oral TID  . sodium chloride flush  10-40 mL Intracatheter Q12H  . tamsulosin  0.4 mg Oral Daily  . triamterene-hydrochlorothiazide  1.5 tablet Oral  Daily   . acetaminophen 1,000 mg (09/12/19 0553)  . methocarbamol (ROBAXIN) IV    . piperacillin-tazobactam (ZOSYN)  IV 3.375 g (09/12/19 0634)  . potassium chloride    . potassium PHOSPHATE IVPB (in mmol)    . TPN ADULT (ION) 40 mL/hr at 09/11/19 2300    Assessment/Plan Sepsis secondary to acute appendicitis Myasthenia gravis - Mestion, CellCept Chronic AF - Digoxin, Eliquis Hypertension BPH BMI 30.85 Thrombocytopenia - platelets 128>> 137>> 166 Protein calorie malnutrition - prealbumin pending   Acute gangrenous appendicitis with perforation at the base of the appendix, purulent peritonitis Laparoscopic appendectomy with closure of colon perforation(5 mm trocar injury and avulsion of the appendix from the cecum,) Blake drain placement, 09/10/2019, Dr. Luretha Murphy  - post op ileus   FEN: NPO, PICC/TPN ID: [email or web address] 7/28 >> day 4 DVT: SQH Follow-up: Dr. Daphine Deutscher  Plan:  Continue antibiotics, PICC/TNA, await return of bowel function. He is getting some meds PO, if he has any issues with this I would switch him over to IV.      LOS: 4 days   Marin Olp, M.D. [facility] Use [email or web address] to contact on call provider

## 2019-09-12 NOTE — Progress Notes (Signed)
NIF -35 FVC 2.0L

## 2019-09-12 NOTE — Progress Notes (Signed)
-  20 NIF, 1.65 FVC

## 2019-09-13 LAB — CBC
HCT: 44.2 % (ref 39.0–52.0)
Hemoglobin: 14.3 g/dL (ref 13.0–17.0)
MCH: 31.6 pg (ref 26.0–34.0)
MCHC: 32.4 g/dL (ref 30.0–36.0)
MCV: 97.6 fL (ref 80.0–100.0)
Platelets: 179 10*3/uL (ref 150–400)
RBC: 4.53 MIL/uL (ref 4.22–5.81)
RDW: 12.9 % (ref 11.5–15.5)
WBC: 8.4 10*3/uL (ref 4.0–10.5)
nRBC: 0 % (ref 0.0–0.2)

## 2019-09-13 LAB — BASIC METABOLIC PANEL
Anion gap: 9 (ref 5–15)
BUN: 20 mg/dL (ref 8–23)
CO2: 27 mmol/L (ref 22–32)
Calcium: 8.3 mg/dL — ABNORMAL LOW (ref 8.9–10.3)
Chloride: 102 mmol/L (ref 98–111)
Creatinine, Ser: 0.48 mg/dL — ABNORMAL LOW (ref 0.61–1.24)
GFR calc Af Amer: >60 mL/min (ref >60)
GFR calc non Af Amer: >60 mL/min (ref >60)
Glucose, Bld: 137 mg/dL — ABNORMAL HIGH (ref 70–99)
Potassium: 3.3 mmol/L — ABNORMAL LOW (ref 3.5–5.1)
Sodium: 138 mmol/L (ref 135–145)

## 2019-09-13 LAB — GLUCOSE, CAPILLARY
Glucose-Capillary: 131 mg/dL — ABNORMAL HIGH (ref 70–99)
Glucose-Capillary: 133 mg/dL — ABNORMAL HIGH (ref 70–99)
Glucose-Capillary: 138 mg/dL — ABNORMAL HIGH (ref 70–99)
Glucose-Capillary: 148 mg/dL — ABNORMAL HIGH (ref 70–99)
Glucose-Capillary: 164 mg/dL — ABNORMAL HIGH (ref 70–99)

## 2019-09-13 LAB — MAGNESIUM: Magnesium: 2 mg/dL (ref 1.7–2.4)

## 2019-09-13 LAB — PHOSPHORUS: Phosphorus: 3.7 mg/dL (ref 2.5–4.6)

## 2019-09-13 MED ORDER — ACETAMINOPHEN 650 MG RE SUPP
650.0000 mg | RECTAL | Status: DC | PRN
  Filled 2019-09-13: qty 1

## 2019-09-13 MED ORDER — TRAVASOL 10 % IV SOLN
INTRAVENOUS | Status: AC
  Filled 2019-09-13: qty 998.4

## 2019-09-13 MED ORDER — POTASSIUM CHLORIDE 10 MEQ/100ML IV SOLN
10.0000 meq | INTRAVENOUS | Status: AC
  Administered 2019-09-13 (×3): 10 meq via INTRAVENOUS
  Filled 2019-09-13 (×2): qty 100

## 2019-09-13 NOTE — Progress Notes (Signed)
[facility]                                                                              Patient Demographics  [name], is a 74 y.o. male, DOB - 22-Feb-1945, OEV:[id number]  Admit date - 09/08/2019   Admitting Physician John Giovanni, MD  Outpatient Primary MD for the patient is [name], MD  Outpatient specialists:   LOS - 5  days   Medical records reviewed and are as summarized below:    Chief Complaint  Patient presents with  . Post-op Problem       Brief summary   Patient is a 74 year old male with a history of congenital intestinal malrotation, A. fib on Eliquis, myasthenia gravis on CellCept, hypertension, hyperlipidemia, GERD presented to ED with fever, abdominal pain and distention.  Patient reported that he had a urological procedure done on Monday, 7/27 at [facility] and Foley catheter was placed.  He was advised to come to ED if he spiked fevers or had abdominal pain or distention.  Patient presented to ED with all the symptoms and abdominal pain, LLQ 8/10 with nausea.  Patient reported that his neurologist, Dr Terrace Arabia tried tapering down the dose of his CellCept 6 months ago but he became symptomatic so the dose has to be increased again.  Since then he has been stable.  No new weakness, double vision, or difficulty with speech/swallowing.  He has been coughing a little for the past few days.  Showed acute appendicitis in the   Assessment & Plan    Principal Problem: Sepsis secondary to acute appendicitis, LLQ with postop ileus -Confirmed on CT with history of congenital intestinal malrotation.  Presented with tachycardia, fever, leukocytosis, source likely due to acute appendicitis.  Lactic acid normal -Status post laparoscopic appendectomy for acute gangrenous appendicitis with perforation, purulent peritonitis, postop day # 3 -Continue n.p.o. status, IV Zosyn, started on TPN.  Management per surgery -Pain controlled  Active  Problems: Chronic atrial fibrillation with RVR -Rate controlled, continue digoxin, changed to IV -Currently on heparin subcu, holding Eliquis until cleared by general surgery -Appreciate cardiology recommendation, had a recent nuclear stress test, echo 03/2018 had shown EF of 60 to 65%     Myasthenia gravis (HCC) - Continue CellCept, patient reports that he has missed Mestinon for 3 days.  Typically takes it 60 mg as needed since CellCept was started and he had been doing well. - Continue to hold ramipril. Resumed Mestinon. Hold CellCept due to active infection -Currently, no acute crisis.  Was evaluated by Dr. Kirkpatrick/neurology on admission.    Hypertension -Continue triamterene HCTZ   BPH -Per patient, had urological procedure on 7/27,, urology, Dr. Mena Goes -Foley removed on 7/30  Obesity Estimated body mass index is 30.85 kg/m as calculated from the following:   Height as of this encounter: 5\' 7"  (1.702 m).   Weight as of this encounter: 89.4 kg.  Code Status: Full code DVT Prophylaxis: Heparin subcu Family Communication: Discussed all imaging results, lab results, explained to the patient    Disposition Plan:     Status is: Inpatient  Remains inpatient appropriate because:Inpatient level  of care appropriate due to severity of illness   Dispo: The patient is from: Home              Anticipated d/c is to: Home              Anticipated d/c date is: 2 days              Patient currently is not medically stable to d/c.  Not stable for discharge, postop ileus, n.p.o.    Time Spent in minutes 25 minutes  Procedures:  Laparoscopic appendectomy with closure of colon perforation, Blake drain placement  Consultants:   General surgery Neurology  Antimicrobials:   Anti-infectives (From admission, onward)   Start     Dose/Rate Route Frequency Ordered Stop   09/09/19 0600  piperacillin-tazobactam (ZOSYN) IVPB 3.375 g     Discontinue     3.375 g 12.5 mL/hr over 240  Minutes Intravenous Every 8 hours 09/09/19 0142     09/08/19 2245  piperacillin-tazobactam (ZOSYN) IVPB 3.375 g        3.375 g 100 mL/hr over 30 Minutes Intravenous  Once 09/08/19 2233 09/09/19 0032         Medications  Scheduled Meds: . atorvastatin  20 mg Oral Daily  . Chlorhexidine Gluconate Cloth  6 each Topical Daily  . digoxin  0.25 mg Intravenous Daily  . heparin  5,000 Units Subcutaneous Q8H  . insulin aspart  0-9 Units Subcutaneous Q6H  . pyridostigmine  60 mg Oral TID  . sodium chloride flush  10-40 mL Intracatheter Q12H  . tamsulosin  0.4 mg Oral Daily  . triamterene-hydrochlorothiazide  1.5 tablet Oral Daily   Continuous Infusions: . methocarbamol (ROBAXIN) IV    . piperacillin-tazobactam (ZOSYN)  IV 3.375 g (09/13/19 1341)  . TPN ADULT (ION) 80 mL/hr at 09/13/19 0300  . TPN ADULT (ION)     PRN Meds:.acetaminophen, methocarbamol (ROBAXIN) IV, morphine injection, oxyCODONE, sodium chloride flush      Subjective:   [name] was seen and examined today.  Abdominal pain stable.  No BM yet, flatus+.  No fevers or chills.  No active nausea or vomiting.  No dizziness, chest pain or lightheadedness.  States he ambulated in the room yesterday.  Objective:   Vitals:   09/12/19 1940 09/13/19 0604 09/13/19 0925 09/13/19 1319  BP: (!) 115/99 (!) 135/95 (!) 125/95 120/83  Pulse: 76 74 81 58  Resp: 20 19  18   Temp: 98.5 F (36.9 C) 97.9 F (36.6 C)  98 F (36.7 C)  TempSrc: Oral Oral  Oral  SpO2: 99% 98% 97% 94%  Weight:      Height:        Intake/Output Summary (Last 24 hours) at 09/13/2019 1347 Last data filed at 09/13/2019 1321 Gross per 24 hour  Intake 2179.99 ml  Output 1330 ml  Net 849.99 ml     Wt Readings from Last 3 Encounters:  09/08/19 89.4 kg  09/03/19 89.7 kg  08/05/19 88.5 kg   Physical Exam  General: Alert and oriented x 3, NAD  Cardiovascular: S1 S2 clear, RRR. No pedal edema b/l  Respiratory: CTAB  Gastrointestinal:  Soft, minimal TTP, lower abdomen, incision CDI, not significantly distended   Ext: no pedal edema bilaterally  Neuro: no new deficits  Musculoskeletal: No cyanosis, clubbing  Skin: No rashes  Psych: Normal affect and demeanor, alert and oriented x3    Data Reviewed:  I have personally reviewed following labs and imaging studies  Micro Results Recent Results (from the past 240 hour(s))  Urine C&S     Status: Abnormal   Collection Time: 09/08/19  8:22 PM   Specimen: Urine, Catheterized  Result Value Ref Range Status   Specimen Description   Final    URINE, CATHETERIZED Performed at [facility], [address] [place], [place] [address]    Special Requests NONE  Final   Culture (A)  Final    <10,000 COLONIES/mL INSIGNIFICANT GROWTH Performed at [facility], [address] [place], [place] [address]    Report Status 09/10/2019 FINAL  Final  SARS Coronavirus 2 by RT PCR (hospital order, performed in [facility] hospital lab) Nasopharyngeal Nasopharyngeal Swab     Status: None   Collection Time: 09/08/19 11:30 PM   Specimen: Nasopharyngeal Swab  Result Value Ref Range Status   SARS Coronavirus 2 NEGATIVE NEGATIVE Final    Comment: (NOTE) SARS-CoV-2 target nucleic acids are NOT DETECTED.  The SARS-CoV-2 RNA is generally detectable in upper and lower respiratory specimens during the acute phase of infection. The lowest concentration of SARS-CoV-2 viral copies this assay can detect is 250 copies / mL. A negative result does not preclude SARS-CoV-2 infection and should not be used as the sole basis for treatment or other patient management decisions.  A negative result may occur with improper specimen collection / handling, submission of specimen other than nasopharyngeal swab, presence of viral mutation(s) within the areas targeted by this assay, and inadequate number of viral copies (<250 copies / mL). A negative result must be combined with  clinical observations, patient history, and epidemiological information.  Fact Sheet for Patients:   BoilerBrush.com.cy  Fact Sheet for Healthcare Providers: https://pope.com/  This test is not yet approved or  cleared by the [place] FDA and has been authorized for detection and/or diagnosis of SARS-CoV-2 by FDA under an Emergency Use Authorization (EUA).  This EUA will remain in effect (meaning this test can be used) for the duration of the COVID-19 declaration under Section 564(b)(1) of the Act, 21 U.S.C. section 360bbb-3(b)(1), unless the authorization is terminated or revoked sooner.  Performed at [facility], [address], [place], [place] [address]   Surgical PCR screen     Status: None   Collection Time: 09/10/19  4:32 AM   Specimen: Nasal Mucosa; Nasal Swab  Result Value Ref Range Status   MRSA, PCR NEGATIVE NEGATIVE Final   Staphylococcus aureus NEGATIVE NEGATIVE Final    Comment: (NOTE) The Xpert SA Assay (FDA approved for NASAL specimens in patients 19 years of age and older), is one component of a comprehensive surveillance program. It is not intended to diagnose infection nor to guide or monitor treatment. Performed at [facility], [address], [place], [place] [address]     Radiology Reports CT ABDOMEN PELVIS WO CONTRAST  Result Date: 09/08/2019 CLINICAL DATA:  Fever and abdominal distension, history of recent prostate procedure EXAM: CT ABDOMEN AND PELVIS WITHOUT CONTRAST TECHNIQUE: Multidetector CT imaging of the abdomen and pelvis was performed following the standard protocol without IV contrast. COMPARISON:  05/17/2019 FINDINGS: Lower chest: Lung bases are clear. Previously seen nodular density in the right lower lobe represent some focal eventration of the diaphragm. No other focal abnormality is noted. Hepatobiliary: No focal liver abnormality is seen. No  gallstones, gallbladder wall thickening, or biliary dilatation. Pancreas: Unremarkable. No pancreatic ductal dilatation or surrounding inflammatory changes. Spleen: Normal in size without focal abnormality.  Adrenals/Urinary Tract: Adrenal glands demonstrate nodularity stable from the prior exam. No renal calculi are seen. Hypodensity in the left mid kidney is noted consistent with cyst. The bladder is decompressed by Foley catheter. Stomach/Bowel: Non rotation of the bowel is noted with the cecum in the left lower quadrant and the majority of the small bowel on the right. The appendix is visualized but small. Mild dilatation is seen with mild inflammatory changes suspicious for acute appendicitis. Correlate with laboratory values. No other focal bowel abnormality is noted. Vascular/Lymphatic: Aortic atherosclerosis. No enlarged abdominal or pelvic lymph nodes. Reproductive: Prostate is enlarged. Other: No abdominal wall hernia or abnormality. No abdominopelvic ascites. Musculoskeletal: Degenerative changes of lumbar spine are noted. No acute abnormality is seen. IMPRESSION: Changes suspicious for acute appendicitis in the left lower quadrant due to non rotation of the bowel. These changes have progressed in the interval from the prior exam. The appendix measures approximately 14 mm in greatest dimension. No abscess is seen. Chronic changes stable from the previous exam. Electronically Signed   By: Alcide Clever M.D.   On: 09/08/2019 21:58   DG Chest Port 1 View  Result Date: 09/11/2019 CLINICAL DATA:  Status post PICC line placement. EXAM: PORTABLE CHEST 1 VIEW COMPARISON:  September 09, 2019. FINDINGS: The heart size and mediastinal contours are within normal limits. Both lungs are clear. Interval placement of right-sided PICC line with distal tip in expected position of the SVC. The visualized skeletal structures are unremarkable. IMPRESSION: Interval placement of right-sided PICC line with distal tip in expected  position of the SVC. Electronically Signed   By: Lupita Raider M.D.   On: 09/11/2019 09:54   DG CHEST PORT 1 VIEW  Result Date: 09/09/2019 CLINICAL DATA:  Increased shortness of breath this morning EXAM: PORTABLE CHEST 1 VIEW COMPARISON:  09/20/2016 FINDINGS: No cardiomegaly. Borderline vascular congestion. No edema, consolidation, effusion, or pneumothorax. IMPRESSION: Borderline vascular congestion. Electronically Signed   By: Marnee Spring M.D.   On: 09/09/2019 07:27   DG Abd Portable 2V  Result Date: 09/11/2019 CLINICAL DATA:  Postop abdominal pain. Laparoscopic appendectomy yesterday. EXAM: PORTABLE ABDOMEN - 2 VIEW COMPARISON:  Abdominal CT 09/08/2019 FINDINGS: The lung bases are clear. Mild gaseous distension of small and large bowel in a nonobstructive pattern. Colon appears in the left abdomen with small bowel in the right consistent with history of bowel malrotation. There scattered skin staples. No large volume free intra-abdominal air. Stable osseous structures. IMPRESSION: Mild gaseous distension of small and large bowel in a nonobstructive pattern, likely postoperative ileus. Electronically Signed   By: Narda Rutherford M.D.   On: 09/11/2019 14:54   [place] EKG SITE RITE  Result Date: 09/11/2019 If Site Rite image not attached, placement could not be confirmed due to current cardiac rhythm.   Lab Data:  CBC: Recent Labs  Lab 09/08/19 2224 09/08/19 2233 09/09/19 0513 09/10/19 0301 09/11/19 0304 09/12/19 0424 09/13/19 0345  WBC 15.8*   < > 15.7* 11.7* 9.0 7.9 8.4  NEUTROABS 14.8*  --   --  10.3*  --  6.0  --   HGB 15.9   < > 15.4 14.6 15.2 13.6 14.3  HCT 47.4   < > 47.0 45.2 46.8 41.6 44.2  MCV 97.1   < > 99.4 100.7* 99.4 98.1 97.6  PLT 143*   < > 128* 137* 166 165 179   < > = values in this interval not displayed.   Basic Metabolic Panel: Recent Labs  Lab  09/08/19 2224 09/08/19 2224 09/08/19 2233 09/10/19 0301 09/11/19 0304 09/12/19 0424 09/13/19 0345  NA 136    < > 138 137 134* 139 138  K 3.9   < > 3.7 3.8 3.7 3.2* 3.3*  CL 97*   < > 97* 101 99 100 102  CO2 26  --   --  27 24 27 27   GLUCOSE 112*   < > 108* 84 151* 131* 137*  BUN 17   < > 19 18 22 23 20   CREATININE 0.64   < > 0.80 0.96 0.76 0.65 0.48*  CALCIUM 8.9  --   --  8.3* 8.3* 8.5* 8.3*  MG  --   --   --   --   --  2.0 2.0  PHOS  --   --   --   --   --  2.3* 3.7   < > = values in this interval not displayed.   GFR: Estimated Creatinine Clearance: 85.1 mL/min (A) (by C-G formula based on SCr of 0.48 mg/dL (L)). Liver Function Tests: Recent Labs  Lab 09/08/19 2224 09/09/19 0513 09/12/19 0424  AST 25  --  21  ALT 23  --  18  ALKPHOS 62  --  44  BILITOT 1.9* 1.7* 0.6  PROT 6.8  --  5.6*  ALBUMIN 3.6  --  2.7*   Recent Labs  Lab 09/08/19 2224  LIPASE 26   No results for input(s): AMMONIA in the last 168 hours. Coagulation Profile: No results for input(s): INR, PROTIME in the last 168 hours. Cardiac Enzymes: No results for input(s): CKTOTAL, CKMB, CKMBINDEX, TROPONINI in the last 168 hours. BNP (last 3 results) No results for input(s): PROBNP in the last 8760 hours. HbA1C: No results for input(s): HGBA1C in the last 72 hours. CBG: Recent Labs  Lab 09/12/19 1211 09/12/19 1746 09/13/19 0004 09/13/19 0607 09/13/19 1208  GLUCAP 143* 148* 164* 131* 148*   Lipid Profile: Recent Labs    09/12/19 0424  TRIG 70   Thyroid Function Tests: No results for input(s): TSH, T4TOTAL, FREET4, T3FREE, THYROIDAB in the last 72 hours. Anemia Panel: No results for input(s): VITAMINB12, FOLATE, FERRITIN, TIBC, IRON, RETICCTPCT in the last 72 hours. Urine analysis:    Component Value Date/Time   COLORURINE YELLOW 09/08/2019 2022   APPEARANCEUR HAZY (A) 09/08/2019 2022   LABSPEC 1.025 09/08/2019 2022   PHURINE 6.0 09/08/2019 2022   GLUCOSEU NEGATIVE 09/08/2019 2022   HGBUR LARGE (A) 09/08/2019 2022   BILIRUBINUR SMALL (A) 09/08/2019 2022   KETONESUR 40 (A) 09/08/2019 2022    PROTEINUR >300 (A) 09/08/2019 2022   UROBILINOGEN 0.2 07/05/2007 2323   NITRITE NEGATIVE 09/08/2019 2022   LEUKOCYTESUR TRACE (A) 09/08/2019 2022     Gabriell Casimir M.D. [facility] 09/13/2019, 1:47 PM   Call night coverage person covering after 7pm

## 2019-09-13 NOTE — Progress Notes (Signed)
3 Days Post-Op    CC:  Abdominal pain  Subjective: +Flatus, no BM yet. Pain remains not an issue - last pain med was yesterday am at 10am.  Objective: Vital signs in last 24 hours: Temp:  [97.9 F (36.6 C)-98.5 F (36.9 C)] 97.9 F (36.6 C) (08/01 0604) Pulse Rate:  [62-76] 74 (08/01 0604) Resp:  [14-20] 19 (08/01 0604) BP: (115-135)/(80-99) 135/95 (08/01 0604) SpO2:  [96 %-99 %] 98 % (08/01 0604) Last BM Date: 09/12/19 N.p.o.  Urine 2L  JP drain 60 ss No BM  WBC 8.4   Intake/Output from previous day: 07/31 0701 - 08/01 0700 In: 2180 [P.O.:240; I.V.:1452.2; IV Piggyback:487.8] Out: 1260 [Urine:1200; Drains:60] Intake/Output this shift: No intake/output data recorded.  General appearance: alert, cooperative and no distress Resp: clear to auscultation bilaterally GI: abdomen soft, minimally tender, not significantly distended; incisions c/d/i without erythema or drainage; jp ss  Lab Results:  Recent Labs    09/12/19 0424 09/13/19 0345  WBC 7.9 8.4  HGB 13.6 14.3  HCT 41.6 44.2  PLT 165 179    BMET Recent Labs    09/12/19 0424 09/13/19 0345  NA 139 138  K 3.2* 3.3*  CL 100 102  CO2 27 27  GLUCOSE 131* 137*  BUN 23 20  CREATININE 0.65 0.48*  CALCIUM 8.5* 8.3*   PT/INR No results for input(s): LABPROT, INR in the last 72 hours.  Recent Labs  Lab 09/08/19 2224 09/09/19 0513 09/12/19 0424  AST 25  --  21  ALT 23  --  18  ALKPHOS 62  --  44  BILITOT 1.9* 1.7* 0.6  PROT 6.8  --  5.6*  ALBUMIN 3.6  --  2.7*     Lipase     Component Value Date/Time   LIPASE 26 09/08/2019 2224     Medications: . atorvastatin  20 mg Oral Daily  . Chlorhexidine Gluconate Cloth  6 each Topical Daily  . digoxin  0.25 mg Intravenous Daily  . heparin  5,000 Units Subcutaneous Q8H  . insulin aspart  0-9 Units Subcutaneous Q6H  . pyridostigmine  60 mg Oral TID  . sodium chloride flush  10-40 mL Intracatheter Q12H  . tamsulosin  0.4 mg Oral Daily  .  triamterene-hydrochlorothiazide  1.5 tablet Oral Daily   . methocarbamol (ROBAXIN) IV    . piperacillin-tazobactam (ZOSYN)  IV 3.375 g (09/13/19 0547)  . potassium chloride 10 mEq (09/13/19 0721)  . TPN ADULT (ION) 80 mL/hr at 09/13/19 0300    Assessment/Plan Sepsis secondary to acute appendicitis Myasthenia gravis - Mestion, CellCept Chronic AF - Digoxin, Eliquis Hypertension BPH BMI 30.85 Thrombocytopenia - resolved Protein calorie malnutrition - prealbumin 7.9   Acute gangrenous appendicitis with perforation at the base of the appendix, purulent peritonitis Laparoscopic appendectomy with closure of colon perforation(5 mm trocar injury and avulsion of the appendix from the cecum,) Blake drain placement, 09/10/2019, Dr. Luretha Murphy  - post op ileus   FEN: Continue NPO, PICC/TPN today ID: Zosyn 7/28 >> day 4 DVT: SQH Follow-up: Dr. Daphine Deutscher  Plan:  Continue antibiotics, PICC/TNA, await return of bowel function. He is getting some meds PO, if he has any issues with this I would switch him over to IV.     LOS: 5 days   Marin Olp, M.D. [facility] Use [email or web address] to contact on call provider

## 2019-09-13 NOTE — Progress Notes (Signed)
Pt. Performed NIF X2 -30 VC 2.8L

## 2019-09-13 NOTE — Progress Notes (Signed)
PHARMACY - TOTAL PARENTERAL NUTRITION CONSULT NOTE   Indication: intolerance to enteral feeding  Patient Measurements: Height: 5\' 7"  (170.2 cm) Weight: 89.4 kg (197 lb) IBW/kg (Calculated) : 66.1 TPN AdjBW (KG): 71.9 Body mass index is 30.85 kg/m. Usual Weight:   Assessment: 75 year old male with a history of congenital intestinal malrotation, A. fib on Eliquis, myasthenia gravis on CellCept, hypertension, hyperlipidemia ,BPH s/p BPH procedure 7/26 Dr. 8/26 with indwelling Foley catheter,Ascending aortic aneurysm,GERD presented to ED with fever, abdominal pain and distention  7/29 s/p lap appy for acute gangrenous appendicitis with perforation at the base of the appendix, purulent peritonitis Pharmacy consulted to manage TPN 7/30 for intolerance to enteral feeding  Glucose / Insulin: no hx DM, CBGs 131-164 on sSSI q 6h receiving 3 units/24h  - got dexamethasone 5 mg 7/29 Electrolytes: K 3.3, others WNL Renal: SCr WNL, UOP adequate LFTs / TGs: LFTs WNL, TG WNL Prealbumin / albumin: 7.9 (7/30)/ alb 2.7 Intake / Output; MIVF: + 900L net, no MIVF Surgeries / Procedures: 7/29 lap appy for acute gangrenous appendicitis with perforation at the base of the appendix, purulent peritonitis Central access: 7/30 TPN start date: 7/30  Nutritional Goals (per RD recommendations: pending ): kCal: , Protein: , Fluid:  Goal TPN rate is 80 mL/hr (provides ~ 100 g of protein and 1800 kcals per day)  Current Nutrition:  NPO  Plan:  KCl 10 meq iv x 3 per MD  Continue TPN at goal at 56mL/hr at 1800  This provides ~ 100 gm protein & ~ 1800 Kcal Electrolytes in TPN: 64mEq/L of Na, inc to 84mEq/L of K, 7mEq/L of Ca, 95mEq/L of Mg, and 36mmol/L of Phos. Cl:Ac ratio 1:1 Add standard MVI and trace elements to TPN Continue Sensitive q6h SSI and consider decreasing to q 8 h if CBGs remain WNL Monitor TPN labs on Mon/Thurs  12m, PharmD, BCPS 09/13/2019 9:05 AM

## 2019-09-13 NOTE — Progress Notes (Signed)
-  30 NIF 1.7 FVC

## 2019-09-14 DIAGNOSIS — I10 Essential (primary) hypertension: Secondary | ICD-10-CM

## 2019-09-14 LAB — DIFFERENTIAL
Abs Immature Granulocytes: 0.6 10*3/uL — ABNORMAL HIGH (ref 0.00–0.07)
Basophils Absolute: 0.1 10*3/uL (ref 0.0–0.1)
Basophils Relative: 1 %
Eosinophils Absolute: 0.4 10*3/uL (ref 0.0–0.5)
Eosinophils Relative: 4 %
Immature Granulocytes: 6 %
Lymphocytes Relative: 10 %
Lymphs Abs: 1 10*3/uL (ref 0.7–4.0)
Monocytes Absolute: 0.8 10*3/uL (ref 0.1–1.0)
Monocytes Relative: 7 %
Neutro Abs: 8.1 10*3/uL — ABNORMAL HIGH (ref 1.7–7.7)
Neutrophils Relative %: 72 %

## 2019-09-14 LAB — CBC
HCT: 45.9 % (ref 39.0–52.0)
Hemoglobin: 15.2 g/dL (ref 13.0–17.0)
MCH: 32.4 pg (ref 26.0–34.0)
MCHC: 33.1 g/dL (ref 30.0–36.0)
MCV: 97.9 fL (ref 80.0–100.0)
Platelets: 189 10*3/uL (ref 150–400)
RBC: 4.69 MIL/uL (ref 4.22–5.81)
RDW: 13 % (ref 11.5–15.5)
WBC: 11 10*3/uL — ABNORMAL HIGH (ref 4.0–10.5)
nRBC: 0 % (ref 0.0–0.2)

## 2019-09-14 LAB — COMPREHENSIVE METABOLIC PANEL
ALT: 38 U/L (ref 0–44)
AST: 42 U/L — ABNORMAL HIGH (ref 15–41)
Albumin: 3 g/dL — ABNORMAL LOW (ref 3.5–5.0)
Alkaline Phosphatase: 53 U/L (ref 38–126)
Anion gap: 9 (ref 5–15)
BUN: 19 mg/dL (ref 8–23)
CO2: 28 mmol/L (ref 22–32)
Calcium: 8.6 mg/dL — ABNORMAL LOW (ref 8.9–10.3)
Chloride: 101 mmol/L (ref 98–111)
Creatinine, Ser: 0.66 mg/dL (ref 0.61–1.24)
GFR calc Af Amer: >60 mL/min (ref >60)
GFR calc non Af Amer: >60 mL/min (ref >60)
Glucose, Bld: 130 mg/dL — ABNORMAL HIGH (ref 70–99)
Potassium: 4.2 mmol/L (ref 3.5–5.1)
Sodium: 138 mmol/L (ref 135–145)
Total Bilirubin: 0.7 mg/dL (ref 0.3–1.2)
Total Protein: 6.2 g/dL — ABNORMAL LOW (ref 6.5–8.1)

## 2019-09-14 LAB — PHOSPHORUS: Phosphorus: 3.5 mg/dL (ref 2.5–4.6)

## 2019-09-14 LAB — GLUCOSE, CAPILLARY
Glucose-Capillary: 147 mg/dL — ABNORMAL HIGH (ref 70–99)
Glucose-Capillary: 123 mg/dL — ABNORMAL HIGH (ref 70–99)
Glucose-Capillary: 127 mg/dL — ABNORMAL HIGH (ref 70–99)
Glucose-Capillary: 119 mg/dL — ABNORMAL HIGH (ref 70–99)

## 2019-09-14 LAB — MAGNESIUM: Magnesium: 2.1 mg/dL (ref 1.7–2.4)

## 2019-09-14 LAB — TRIGLYCERIDES: Triglycerides: 106 mg/dL (ref <150)

## 2019-09-14 LAB — PREALBUMIN: Prealbumin: 15.3 mg/dL — ABNORMAL LOW (ref 18–38)

## 2019-09-14 LAB — SURGICAL PATHOLOGY

## 2019-09-14 MED ORDER — TRAVASOL 10 % IV SOLN
INTRAVENOUS | Status: AC
  Filled 2019-09-14: qty 998.4

## 2019-09-14 MED ORDER — TRAMADOL HCL 50 MG PO TABS
50.0000 mg | ORAL_TABLET | Freq: Four times a day (QID) | ORAL | Status: DC | PRN
  Filled 2019-09-14: qty 1

## 2019-09-14 MED ORDER — INSULIN ASPART 100 UNIT/ML ~~LOC~~ SOLN
0.0000 [IU] | Freq: Three times a day (TID) | SUBCUTANEOUS | Status: DC
  Administered 2019-09-14 – 2019-09-16 (×4): 1 [IU] via SUBCUTANEOUS

## 2019-09-14 MED ORDER — ACETAMINOPHEN 325 MG PO TABS
650.0000 mg | ORAL_TABLET | Freq: Four times a day (QID) | ORAL | Status: DC | PRN
  Administered 2019-09-14 – 2019-09-17 (×5): 650 mg via ORAL
  Filled 2019-09-14 (×5): qty 2

## 2019-09-14 NOTE — Progress Notes (Addendum)
PROGRESS NOTE    [name]  JJO:[id number]  DOB: November 19, 1945  PCP: [name], MD Admit date:09/08/2019 Chief compliant: abdominal pain Hospital course: 74 year old male with a history of congenital intestinal malrotation, A. fib on Eliquis, myasthenia gravis on CellCept, hypertension, hyperlipidemia, GERD presented to ED with fever, abdominal pain and distention.  Patient reported that he had a urological procedure done on Monday, 7/27 at [facility] urology and Foley catheter was placed.  He was advised to come to ED if he spiked fevers or had abdominal pain or distention.  Patient presented to ED with all the symptoms and abdominal pain, LLQ 8/10 with nausea.  Patient reported that his neurologist, Dr Terrace Arabia tried tapering down the dose of his CellCept 6 months ago but he became symptomatic so the dose has to be increased again. Since then he has been stable. No new weakness, double vision, or difficulty with speech/swallowing. He has been coughing a little for the past few days. W/U in the ED revealed sepsis due to acute appendicitis. Patient admitted to [facility] with GS consultation. Patient underwent laparoscopic appendectomy with JP drain placement on 7/29 for acute gangrenous appendicitis with perforation, purulent peritonitis--developed post operative complication of ileus.Has been NPO with IV fluids over the weekend.  Subjective:  Patient seen with surgical APP at bedside.  Wife on speaker phone during visit.  Patient denies any abdominal complaints and reports that his been passing gas.  JP drain with clear serosanguineous fluid.  Noted to be on 1 L O2 via nasal cannula.  Postop day 4  Objective: Vitals:   09/13/19 0925 09/13/19 1319 09/13/19 2035 09/14/19 0530  BP: (!) 125/95 120/83 (!) 129/82 (!) 130/80  Pulse: 81 58 77 58  Resp:  18 20 20   Temp:  98 F (36.7 C) 97.9 F (36.6 C) 97.8 F (36.6 C)  TempSrc:  Oral    SpO2: 97% 94% 99% 99%  Weight:      Height:         Intake/Output Summary (Last 24 hours) at 09/14/2019 0906 Last data filed at 09/13/2019 1842 Gross per 24 hour  Intake 1166.56 ml  Output 1350 ml  Net -183.44 ml   Filed Weights   09/08/19 2010  Weight: 89.4 kg    Physical Examination: General: Moderately built, no acute distress noted Head ENT: Atraumatic normocephalic, PERRLA, neck supple Heart: S1-S2 heard, regular rate and rhythm, no murmurs.  No leg edema noted Lungs: Equal air entry bilaterally, no rhonchi or rales on exam, no accessory muscle use Abdomen: JP drain with serosanguineous fluid.  Staples at laparoscopic entry site.  Abdomen overall soft, nondistended and no significant tenderness.   Extremities: No pedal edema.  No cyanosis or clubbing. Neurological: Awake alert oriented x3, no focal weakness or numbness, strength and sensations to crude touch intact Skin: No wounds or rashes.  Data Reviewed: I have personally reviewed following labs and imaging studies  CBC: Recent Labs  Lab 09/08/19 2224 09/08/19 2233 09/10/19 0301 09/11/19 0304 09/12/19 0424 09/13/19 0345 09/14/19 0350  WBC 15.8*   < > 11.7* 9.0 7.9 8.4 11.0*  NEUTROABS 14.8*  --  10.3*  --  6.0  --  8.1*  HGB 15.9   < > 14.6 15.2 13.6 14.3 15.2  HCT 47.4   < > 45.2 46.8 41.6 44.2 45.9  MCV 97.1   < > 100.7* 99.4 98.1 97.6 97.9  PLT 143*   < > 137* 166 165 179 189   < > =  values in this interval not displayed.   Basic Metabolic Panel: Recent Labs  Lab 09/10/19 0301 09/11/19 0304 09/12/19 0424 09/13/19 0345 09/14/19 0350  NA 137 134* 139 138 138  K 3.8 3.7 3.2* 3.3* 4.2  CL 101 99 100 102 101  CO2 27 24 27 27 28   GLUCOSE 84 151* 131* 137* 130*  BUN 18 22 23 20 19   CREATININE 0.96 0.76 0.65 0.48* 0.66  CALCIUM 8.3* 8.3* 8.5* 8.3* 8.6*  MG  --   --  2.0 2.0 2.1  PHOS  --   --  2.3* 3.7 3.5   GFR: Estimated Creatinine Clearance: 85.1 mL/min (by C-G formula based on SCr of 0.66 mg/dL). Liver Function Tests: Recent Labs  Lab  09/08/19 2224 09/09/19 0513 09/12/19 0424 09/14/19 0350  AST 25  --  21 42*  ALT 23  --  18 38  ALKPHOS 62  --  44 53  BILITOT 1.9* 1.7* 0.6 0.7  PROT 6.8  --  5.6* 6.2*  ALBUMIN 3.6  --  2.7* 3.0*   Recent Labs  Lab 09/08/19 2224  LIPASE 26   No results for input(s): AMMONIA in the last 168 hours. Coagulation Profile: No results for input(s): INR, PROTIME in the last 168 hours. Cardiac Enzymes: No results for input(s): CKTOTAL, CKMB, CKMBINDEX, TROPONINI in the last 168 hours. BNP (last 3 results) No results for input(s): PROBNP in the last 8760 hours. HbA1C: No results for input(s): HGBA1C in the last 72 hours. CBG: Recent Labs  Lab 09/13/19 0607 09/13/19 1208 09/13/19 1721 09/13/19 2358 09/14/19 0533  GLUCAP 131* 148* 138* 133* 147*   Lipid Profile: Recent Labs    09/12/19 0424 09/14/19 0350  TRIG 70 106   Thyroid Function Tests: No results for input(s): TSH, T4TOTAL, FREET4, T3FREE, THYROIDAB in the last 72 hours. Anemia Panel: No results for input(s): VITAMINB12, FOLATE, FERRITIN, TIBC, IRON, RETICCTPCT in the last 72 hours. Sepsis Labs: Recent Labs  Lab 09/09/19 0151  LATICACIDVEN 1.0    Recent Results (from the past 240 hour(s))  Urine C&S     Status: Abnormal   Collection Time: 09/08/19  8:22 PM   Specimen: Urine, Catheterized  Result Value Ref Range Status   Specimen Description   Final    URINE, CATHETERIZED Performed at [facility], [address] [place], [place]    Special Requests NONE  Final   Culture (A)  Final    <10,000 COLONIES/mL INSIGNIFICANT GROWTH Performed at [facility], [address], [place], [address] [place]    Report Status 09/10/2019 FINAL  Final  SARS Coronavirus 2 by RT PCR (hospital order, performed in [facility] hospital lab) Nasopharyngeal Nasopharyngeal Swab     Status: None   Collection Time: 09/08/19 11:30 PM   Specimen: Nasopharyngeal Swab  Result Value Ref Range  Status   SARS Coronavirus 2 NEGATIVE NEGATIVE Final    Comment: (NOTE) SARS-CoV-2 target nucleic acids are NOT DETECTED.  The SARS-CoV-2 RNA is generally detectable in upper and lower respiratory specimens during the acute phase of infection. The lowest concentration of SARS-CoV-2 viral copies this assay can detect is 250 copies / mL. A negative result does not preclude SARS-CoV-2 infection and should not be used as the sole basis for treatment or other patient management decisions.  A negative result may occur with improper specimen collection / handling, submission of specimen other than nasopharyngeal swab, presence of viral mutation(s) within the areas targeted by  this assay, and inadequate number of viral copies (<250 copies / mL). A negative result must be combined with clinical observations, patient history, and epidemiological information.  Fact Sheet for Patients:   BoilerBrush.com.cyhttps://www.fda.gov/media/136312/download  Fact Sheet for Healthcare Providers: https://pope.com/https://www.fda.gov/media/136313/download  This test is not yet approved or  cleared by the [place] FDA and has been authorized for detection and/or diagnosis of SARS-CoV-2 by FDA under an Emergency Use Authorization (EUA).  This EUA will remain in effect (meaning this test can be used) for the duration of the COVID-19 declaration under Section 564(b)(1) of the Act, 21 U.S.C. section 360bbb-3(b)(1), unless the authorization is terminated or revoked sooner.  Performed at [facility], [address] [place], [place] [address]   Surgical PCR screen     Status: None   Collection Time: 09/10/19  4:32 AM   Specimen: Nasal Mucosa; Nasal Swab  Result Value Ref Range Status   MRSA, PCR NEGATIVE NEGATIVE Final   Staphylococcus aureus NEGATIVE NEGATIVE Final    Comment: (NOTE) The Xpert SA Assay (FDA approved for NASAL specimens in patients 76 years of age and older), is one component of a  comprehensive surveillance program. It is not intended to diagnose infection nor to guide or monitor treatment. Performed at [facility], [address] [place], [place] [address]       Radiology Studies: No results found.    Scheduled Meds: . atorvastatin  20 mg Oral Daily  . Chlorhexidine Gluconate Cloth  6 each Topical Daily  . digoxin  0.25 mg Intravenous Daily  . heparin  5,000 Units Subcutaneous Q8H  . insulin aspart  0-9 Units Subcutaneous Q6H  . pyridostigmine  60 mg Oral TID  . sodium chloride flush  10-40 mL Intracatheter Q12H  . tamsulosin  0.4 mg Oral Daily  . triamterene-hydrochlorothiazide  1.5 tablet Oral Daily   Continuous Infusions: . methocarbamol (ROBAXIN) IV    . piperacillin-tazobactam (ZOSYN)  IV 3.375 g (09/14/19 0524)  . TPN ADULT (ION) 80 mL/hr at 09/13/19 1713  . TPN ADULT (ION)        Assessment/Plan:  1.  Sepsis secondary to acute gangrenous appendicitis/peritonitis: Present on admission with fever, leukocytosis, tachycardia, although lactate normal.  S/p appendectomy for acute gangrenous appendicitis with perforation/purulent peritonitis requiring JP drain placement.  Postoperatively developed ileus and been n.p.o. with IV fluids over the weekend.  Currently on TPN and IV Zosyn.  Leukocytosis currently resolved.  Defer JP drain management per GS.  Patient has orders for oxycodone and IV methocarbamol as needed and has required neither.  DC IV methocarbamol in concern for exacerbating problem #2  2.  Postoperative ileus: General surgery following along.  Patient has history of congenital intestinal malrotation, is familiar with NG tube and process but has not required NG tube in this hospitalization.  N.p.o./IV fluids/TPN over the weekend.  Patient reports passing flatus and abdomen appears soft today.  General surgery cleared for clear liquid diet.  Advance diet slowly over the next 48 hours and anticipate going home once  tolerating regular diet and cleared by GS.    3.  Chronic A. fib with, RVR in this hospitalization: Currently rate controlled and on IV digoxin.  Can change to p.o. meds and resume Eliquis if tolerates clear liquid diet and okay per general surgery.  Evaluated by cardiology (Dr. Royann Shiversroitoru reviewed notes) for preoperative risk assessment and given recent normal echo/nuclear stress test from November 2020, no further work-up was recommended other than  holding anticoagulation until general surgery clearance.  4.  Myasthenia gravis: Being followed by RT for NIF with good effort documented.  Patient noted to be on 1 L O2, will taper to off as tolerated.  Ordered incentive spirometry and encourage patient to use.  Resumed on pyridostigmine.  5.  Hypertension: On triamterene/HCTZ  6.  BPH: On tamsulosin.  7.  Hyperlipidemia: On statins  DVT prophylaxis: Heparin every 8, resume Eliquis when okay per GS Code Status: Full code Family / Patient Communication: Discussed with patient and wife over the phone at bedside. Disposition Plan:   Status is: Inpatient  Remains inpatient appropriate because:IV treatments appropriate due to intensity of illness or inability to take PO   Dispo: The patient is from: Home              Anticipated d/c is to: Home              Anticipated d/c date is: 2 days              Patient currently is not medically stable to d/c.          Time spent: 35 minutes     >50% time spent in discussions with care team and coordination of care.    Alessandra Bevels, MD [facility] Pager in [place]  If 7PM-7AM, please contact night-coverage [email or web address] 09/14/2019, 9:06 AM

## 2019-09-14 NOTE — Progress Notes (Signed)
NIF -35, FVC 2.0L

## 2019-09-14 NOTE — Progress Notes (Signed)
NIF -35, FVC 1.9L

## 2019-09-14 NOTE — Progress Notes (Signed)
4 Days Post-Op    CC: Abdominal pain  Subjective: Patient appears to be doing much better this a.m.  Passing some flatus and has had a couple bowel movements.  Abdomen is soft does not appear as tender as last week.  Drainage from the JP is clear serosanguineous fluid.  Objective: Vital signs in last 24 hours: Temp:  [97.8 F (36.6 C)-98 F (36.7 C)] 97.8 F (36.6 C) (08/02 0530) Pulse Rate:  [58-81] 58 (08/02 0530) Resp:  [18-20] 20 (08/02 0530) BP: (120-130)/(80-95) 130/80 (08/02 0530) SpO2:  [94 %-99 %] 99 % (08/02 0530) Last BM Date: 09/13/19 (smear) N.p.o. 1166 IV 1350 urine BM x2 Afebrile vital signs are stable Glucose 130, calcium 8.6, albumin 3.0 AST 42 ALT 38 total bilirubin 0.7 Prealbumin 15.3  Intake/Output from previous day: 08/01 0701 - 08/02 0700 In: 1166.6 [I.V.:854.3; IV Piggyback:312.2] Out: 1350 [Urine:1350] Intake/Output this shift: No intake/output data recorded.  General appearance: alert, cooperative and no distress Resp: clear to auscultation bilaterally GI: Soft, normal postop soreness.  Few bowel sounds, positive flatus and BM.  JP drain is serosanguineous.  Lab Results:  Recent Labs    09/13/19 0345 09/14/19 0350  WBC 8.4 11.0*  HGB 14.3 15.2  HCT 44.2 45.9  PLT 179 189    BMET Recent Labs    09/13/19 0345 09/14/19 0350  NA 138 138  K 3.3* 4.2  CL 102 101  CO2 27 28  GLUCOSE 137* 130*  BUN 20 19  CREATININE 0.48* 0.66  CALCIUM 8.3* 8.6*   PT/INR No results for input(s): LABPROT, INR in the last 72 hours.  Recent Labs  Lab 09/08/19 2224 09/09/19 0513 09/12/19 0424 09/14/19 0350  AST 25  --  21 42*  ALT 23  --  18 38  ALKPHOS 62  --  44 53  BILITOT 1.9* 1.7* 0.6 0.7  PROT 6.8  --  5.6* 6.2*  ALBUMIN 3.6  --  2.7* 3.0*     Lipase     Component Value Date/Time   LIPASE 26 09/08/2019 2224     Medications: . atorvastatin  20 mg Oral Daily  . Chlorhexidine Gluconate Cloth  6 each Topical Daily  . digoxin  0.25  mg Intravenous Daily  . heparin  5,000 Units Subcutaneous Q8H  . insulin aspart  0-9 Units Subcutaneous Q6H  . pyridostigmine  60 mg Oral TID  . sodium chloride flush  10-40 mL Intracatheter Q12H  . tamsulosin  0.4 mg Oral Daily  . triamterene-hydrochlorothiazide  1.5 tablet Oral Daily    Assessment/Plan Sepsis secondary to acute appendicitis Myasthenia gravis - Mestion, CellCept Chronic AF - Digoxin, Eliquis Hypertension BPH BMI 30.85 Thrombocytopenia - platelets 128>> 137>> 166 Modeated Protein calorie malnutrition - 7.9>>15.3  Acute gangrenous appendicitis with perforation at the base of the appendix, purulent peritonitis Laparoscopic appendectomy with closure of colon perforation(5 mm trocar injury and avulsion of the appendix from the cecum,) Blake drain placement, 09/10/2019, Dr. Luretha Murphy POD #4  - post op ileus -improving    FEN: N.p.o./TPN >> start clears ID: Zosyn 7/28 >> day 6 DVT: Heparin SQ Follow-up: Dr. Daphine Deutscher  Plan: Clear liquids today.  Mobilize more.,  Wean O2 if sats are good.  Continue antibiotics.  Continue TPN until he is tolerating full liquids well.      LOS: 6 days    Marky Buresh 09/14/2019 Please see Amion

## 2019-09-14 NOTE — Care Management Important Message (Signed)
Important Message  Patient Details IM Letter given to the Patient Name: SID GREENER MRN: [id number] Date of Birth: 08-18-1945   Medicare Important Message Given:  Yes     Caren Macadam 09/14/2019, 12:34 PM

## 2019-09-14 NOTE — Progress Notes (Signed)
PHARMACY - TOTAL PARENTERAL NUTRITION CONSULT NOTE   Indication: intolerance to enteral feeding  Patient Measurements: Height: 5\' 7"  (170.2 cm) Weight: 89.4 kg (197 lb) IBW/kg (Calculated) : 66.1 TPN AdjBW (KG): 71.9 Body mass index is 30.85 kg/m. Usual Weight:   Assessment: 74 year old male with a history of congenital intestinal malrotation, A. fib on Eliquis, myasthenia gravis on CellCept, hypertension, hyperlipidemia ,BPH s/p BPH procedure 7/26 Dr. 8/26 with indwelling Foley catheter,Ascending aortic aneurysm,GERD presented to ED with fever, abdominal pain and distention  7/29 s/p lap appy for acute gangrenous appendicitis with perforation at the base of the appendix, purulent peritonitis Pharmacy consulted to manage TPN 7/30 for intolerance to enteral feeding  Glucose / Insulin: no hx DM, CBGs 131-148, used 5 units/24h - note dexamethasone 5 mg 7/29 Electrolytes: all WNL Renal: SCr WNL, UOP adequate LFTs / TGs: LFTs WNL, TG WNL Prealbumin / albumin: 7.9 (7/30), 15.3 (8/2)/ alb 3 Intake / Output; MIVF: UOP 1.35L, no MIVF, states no appetite Surgeries / Procedures: 7/29 lap appy for acute gangrenous appendicitis with perforation at the base of the appendix, purulent peritonitis Central access: 7/30 TPN start date: 7/30  Nutritional Goals (per RD recommendations: pending ): kCal: , Protein: , Fluid:  Goal TPN rate is 80 mL/hr (provides ~ 100 g of protein and 1800 kcals per day)  Current Nutrition:  NPO  Plan:  Continue TPN at goal at 45mL/hr at 1800  This provides ~ 100 gm protein & ~ 1800 Kcal Electrolytes in TPN: 59mEq/L of Na, inc to 84mEq/L of K, 18mEq/L of Ca, 54mEq/L of Mg, and 79mmol/L of Phos. Cl:Ac ratio 1:1 Add standard MVI and trace elements to TPN Continue Sensitive SSI and decrease to tid-ac with CL diet Monitor TPN labs on Mon/Thurs  12m PharmD 09/14/2019, 7:16 AM

## 2019-09-15 LAB — GLUCOSE, CAPILLARY
Glucose-Capillary: 105 mg/dL — ABNORMAL HIGH (ref 70–99)
Glucose-Capillary: 121 mg/dL — ABNORMAL HIGH (ref 70–99)
Glucose-Capillary: 121 mg/dL — ABNORMAL HIGH (ref 70–99)
Glucose-Capillary: 124 mg/dL — ABNORMAL HIGH (ref 70–99)
Glucose-Capillary: 132 mg/dL — ABNORMAL HIGH (ref 70–99)
Glucose-Capillary: 132 mg/dL — ABNORMAL HIGH (ref 70–99)
Glucose-Capillary: 149 mg/dL — ABNORMAL HIGH (ref 70–99)
Glucose-Capillary: 149 mg/dL — ABNORMAL HIGH (ref 70–99)

## 2019-09-15 MED ORDER — DIGOXIN 125 MCG PO TABS
0.2500 mg | ORAL_TABLET | Freq: Every day | ORAL | Status: DC
  Administered 2019-09-15 – 2019-09-17 (×3): 0.25 mg via ORAL
  Filled 2019-09-15 (×4): qty 2

## 2019-09-15 MED ORDER — TRAVASOL 10 % IV SOLN
INTRAVENOUS | Status: AC
  Filled 2019-09-15: qty 499.2

## 2019-09-15 MED ORDER — POTASSIUM CHLORIDE 20 MEQ PO PACK: 40.0000 meq | PACK | Freq: Once | ORAL | Status: DC

## 2019-09-15 MED ORDER — AMOXICILLIN-POT CLAVULANATE 875-125 MG PO TABS
1.0000 | ORAL_TABLET | Freq: Two times a day (BID) | ORAL | Status: DC
  Administered 2019-09-15 – 2019-09-17 (×4): 1 via ORAL
  Filled 2019-09-15 (×4): qty 1

## 2019-09-15 NOTE — Progress Notes (Signed)
Pt gave great effort with NIF/ VC.    Best NIF: -40 Best VC 1.8L

## 2019-09-15 NOTE — Progress Notes (Signed)
PHARMACY - TOTAL PARENTERAL NUTRITION CONSULT NOTE   Indication: intolerance to enteral feeding  Patient Measurements: Height: 5\' 7"  (170.2 cm) Weight: 89.4 kg (197 lb) IBW/kg (Calculated) : 66.1 TPN AdjBW (KG): 71.9 Body mass index is 30.85 kg/m. Usual Weight:   Assessment: 74 year old male with a history of congenital intestinal malrotation, A. fib on Eliquis, myasthenia gravis on CellCept, hypertension, hyperlipidemia ,BPH s/p BPH procedure 7/26 Dr. 8/26 with indwelling Foley catheter,Ascending aortic aneurysm,GERD presented to ED with fever, abdominal pain and distention  7/29 s/p lap appy for acute gangrenous appendicitis with perforation at the base of the appendix, purulent peritonitis Pharmacy consulted to manage TPN 7/30 for intolerance to enteral feeding  Glucose / Insulin: no hx DM, CBGs 119-149, used 3 units/24h - note dexamethasone 5 mg 7/29 Electrolytes: all WNL 8/2 Renal: SCr WNL, UOP adequate LFTs / TGs: LFTs WNL, TG WNL Prealbumin / albumin: 7.9 (7/30), 15.3 (8/2)/ alb 3 Intake / Output; MIVF: UOP incomplete, no MIVF Surgeries / Procedures: 7/29 lap appy for acute gangrenous appendicitis with perforation at the base of the appendix, purulent peritonitis Central access: 7/30 TPN start date: 7/30  Nutritional Goals (per RD recommendations: pending ): kCal: , Protein: , Fluid:  Goal TPN rate is 80 mL/hr (provides ~ 100 g of protein and 1800 kcals per day)  Current Nutrition: advanced to [facility] diet  Plan:  Reduced TPN rate to 40 ml/hr this morning, continue same rate with next bag This provides ~ 50 gm protein & ~ 900 Kcal Electrolytes in TPN: 22mEq/L of Na, inc to 71mEq/L of K, 36mEq/L of Ca, 33mEq/L of Mg, and 59mmol/L of Phos. Cl:Ac ratio 1:1 Add standard MVI and trace elements to TPN Continue Sensitive SSI and decrease to tid-ac with FL diet Monitor TPN labs on Mon/Thurs  12m PharmD 09/15/2019, 11:45 AM

## 2019-09-15 NOTE — Progress Notes (Addendum)
[facility] Progress Note  5 Days Post-Op  Subjective: CC-  Abdomen still sore but improving. Tolerating clear liquids. Denies bloating or n/v. Passing flatus and had 1 BM yesterday and 1 this morning.  Objective: Vital signs in last 24 hours: Temp:  [97.7 F (36.5 C)-98.3 F (36.8 C)] 97.7 F (36.5 C) (08/03 0522) Pulse Rate:  [69-79] 73 (08/03 0522) Resp:  [16-18] 18 (08/03 0522) BP: (109-119)/(73-76) 109/76 (08/03 0522) SpO2:  [96 %-99 %] 99 % (08/03 0522) Last BM Date: 09/14/19  Intake/Output from previous day: 08/02 0701 - 08/03 0700 In: 4110.9 [P.O.:931; I.V.:2924.8; IV Piggyback:255] Out: 1555 [Urine:1550; Drains:5] Intake/Output this shift: Total I/O In: 300 [P.O.:300] Out: 400 [Urine:400]  PE: Gen:  Alert, NAD, pleasant HEENT: EOM's intact, pupils equal and round Pulm:  rate and effort normal Abd: Soft, nondistended, appropriately tender, +BS, incisions cdi with staples intact and no erythema or drainage, JP drain with small amount serous drainage Psych: A&Ox3 Skin: no rashes noted, warm and dry  Lab Results:  Recent Labs    09/13/19 0345 09/14/19 0350  WBC 8.4 11.0*  HGB 14.3 15.2  HCT 44.2 45.9  PLT 179 189   BMET Recent Labs    09/13/19 0345 09/14/19 0350  NA 138 138  K 3.3* 4.2  CL 102 101  CO2 27 28  GLUCOSE 137* 130*  BUN 20 19  CREATININE 0.48* 0.66  CALCIUM 8.3* 8.6*   PT/INR No results for input(s): LABPROT, INR in the last 72 hours. CMP     Component Value Date/Time   NA 138 09/14/2019 0350   NA 141 08/05/2019 1311   K 4.2 09/14/2019 0350   CL 101 09/14/2019 0350   CO2 28 09/14/2019 0350   GLUCOSE 130 (H) 09/14/2019 0350   BUN 19 09/14/2019 0350   BUN 25 08/05/2019 1311   CREATININE 0.66 09/14/2019 0350   CREATININE 0.65 (L) 01/13/2015 1035   CALCIUM 8.6 (L) 09/14/2019 0350   PROT 6.2 (L) 09/14/2019 0350   PROT 6.4 08/05/2019 1311   ALBUMIN 3.0 (L) 09/14/2019 0350   ALBUMIN 4.3 08/05/2019 1311   AST 42  (H) 09/14/2019 0350   ALT 38 09/14/2019 0350   ALKPHOS 53 09/14/2019 0350   BILITOT 0.7 09/14/2019 0350   BILITOT 0.5 08/05/2019 1311   GFRNONAA >60 09/14/2019 0350   GFRAA >60 09/14/2019 0350   Lipase     Component Value Date/Time   LIPASE 26 09/08/2019 2224       Studies/Results: No results found.  Anti-infectives: Anti-infectives (From admission, onward)   Start     Dose/Rate Route Frequency Ordered Stop   09/09/19 0600  piperacillin-tazobactam (ZOSYN) IVPB 3.375 g     Discontinue     3.375 g 12.5 mL/hr over 240 Minutes Intravenous Every 8 hours 09/09/19 0142     09/08/19 2245  piperacillin-tazobactam (ZOSYN) IVPB 3.375 g        3.375 g 100 mL/hr over 30 Minutes Intravenous  Once 09/08/19 2233 09/09/19 0032       Assessment/Plan Sepsis secondary to acute appendicitis Myasthenia gravis- Mestion, CellCept Chronic AF - Digoxin, Eliquis Hypertension BPH BMI 30.85 Thrombocytopenia - resolved Moderate Protein calorie malnutrition - 7.9>>15.3(8/2)  Acute gangrenous appendicitis with perforation at the base of the appendix, purulent peritonitis - S/p Laparoscopic appendectomy with closure of colon perforation(5 mm trocar injury and avulsion of the appendix from the cecum,)Blake drain placement, 09/10/2019, Dr. Luretha Murphy - POD #5 - post op ileus -improving - continue  JP drain for now and monitor output, may be able to come out prior to discharge it if remain serous and low volume  FEN: wean TPN, FLD ID: Zosyn 7/28>>day#7 DVT: Heparin SQ Follow-up: Dr. Daphine Deutscher  Plan: Advance to full liquids and wean TPN to 1/2 rate. Continue antibiotics. Mobilize, will ask PT to see. Continue holding eliquis. Repeat labs in AM.   LOS: 7 days    Franne Forts, [facility] 09/15/2019, 12:22 PM Please see Amion for pager number during day hours 7:00am-4:30pm

## 2019-09-15 NOTE — Progress Notes (Signed)
NIF -40/VC 1.2L Pt had very good effort.

## 2019-09-15 NOTE — Progress Notes (Addendum)
PROGRESS NOTE    [name]  PNT:[id number]  DOB: 10/08/1945  PCP: [name], MD Admit date:09/08/2019 Chief compliant: abdominal pain Hospital course: 74 year old male with a history of congenital intestinal malrotation, A. fib on Eliquis, myasthenia gravis on CellCept, hypertension, hyperlipidemia, GERD presented to ED with fever, abdominal pain and distention.  Patient reported that he had a urological procedure done on Monday, 7/27 at [facility] urology and Foley catheter was placed.  He was advised to come to ED if he spiked fevers or had abdominal pain or distention.  Patient presented to ED with all the symptoms and abdominal pain, LLQ 8/10 with nausea.  Patient reported that his neurologist, Dr Terrace Arabia tried tapering down the dose of his CellCept 6 months ago but he became symptomatic so the dose has to be increased again. Since then he has been stable. No new weakness, double vision, or difficulty with speech/swallowing. He has been coughing a little for the past few days. W/U in the ED revealed sepsis due to acute appendicitis. Patient admitted to [facility] with GS consultation. Patient underwent laparoscopic appendectomy with JP drain placement on 7/29 for acute gangrenous appendicitis with perforation, purulent peritonitis--developed post operative complication of ileus.Has been NPO with IV fluids over the weekend.  Subjective:  Patient seen and examined.  Denies any complaints, had a BM yesterday.  JP drain with clear serosanguineous fluid.  Noted to be on 2 L O2 via nasal cannula-states ported on last night as he has history of sleep apnea..  Postop day 5  Objective: Vitals:   09/14/19 1049 09/14/19 1404 09/14/19 1947 09/15/19 0522  BP: (!) 133/77 117/73 119/74 109/76  Pulse: 64 79 69 73  Resp:  16 18 18   Temp:  98.3 F (36.8 C) 98 F (36.7 C) 97.7 F (36.5 C)  TempSrc:  Oral Oral Oral  SpO2: 98% 97% 96% 99%  Weight:      Height:        Intake/Output Summary (Last  24 hours) at 09/15/2019 1214 Last data filed at 09/15/2019 0945 Gross per 24 hour  Intake 4410.86 ml  Output 1780 ml  Net 2630.86 ml   Filed Weights   09/08/19 2010  Weight: 89.4 kg    Physical Examination: General: Moderately built, no acute distress noted Head ENT: Atraumatic normocephalic, PERRLA, neck supple Heart: S1-S2 heard, regular rate and rhythm, no murmurs.  No leg edema noted Lungs: Equal air entry bilaterally, no rhonchi or rales on exam, no accessory muscle use Abdomen: JP drain in place with serosanguineous minimal output, expected tenderness at laparoscopic staple site, no active drainage or signs of infection.  Abdomen nondistended, overall soft, bowel sounds heard.  Extremities: No pedal edema.  No cyanosis or clubbing. Neurological: Awake alert oriented x3, no focal weakness or numbness, strength and sensations to crude touch intact Skin: No wounds or rashes.  Data Reviewed: I have personally reviewed following labs and imaging studies  CBC: Recent Labs  Lab 09/08/19 2224 09/08/19 2233 09/10/19 0301 09/11/19 0304 09/12/19 0424 09/13/19 0345 09/14/19 0350  WBC 15.8*   < > 11.7* 9.0 7.9 8.4 11.0*  NEUTROABS 14.8*  --  10.3*  --  6.0  --  8.1*  HGB 15.9   < > 14.6 15.2 13.6 14.3 15.2  HCT 47.4   < > 45.2 46.8 41.6 44.2 45.9  MCV 97.1   < > 100.7* 99.4 98.1 97.6 97.9  PLT 143*   < > 137* 166 165 179 189   < > =  values in this interval not displayed.   Basic Metabolic Panel: Recent Labs  Lab 09/10/19 0301 09/11/19 0304 09/12/19 0424 09/13/19 0345 09/14/19 0350  NA 137 134* 139 138 138  K 3.8 3.7 3.2* 3.3* 4.2  CL 101 99 100 102 101  CO2 27 24 27 27 28   GLUCOSE 84 151* 131* 137* 130*  BUN 18 22 23 20 19   CREATININE 0.96 0.76 0.65 0.48* 0.66  CALCIUM 8.3* 8.3* 8.5* 8.3* 8.6*  MG  --   --  2.0 2.0 2.1  PHOS  --   --  2.3* 3.7 3.5   GFR: Estimated Creatinine Clearance: 85.1 mL/min (by C-G formula based on SCr of 0.66 mg/dL). Liver Function  Tests: Recent Labs  Lab 09/08/19 2224 09/09/19 0513 09/12/19 0424 09/14/19 0350  AST 25  --  21 42*  ALT 23  --  18 38  ALKPHOS 62  --  44 53  BILITOT 1.9* 1.7* 0.6 0.7  PROT 6.8  --  5.6* 6.2*  ALBUMIN 3.6  --  2.7* 3.0*   Recent Labs  Lab 09/08/19 2224  LIPASE 26   No results for input(s): AMMONIA in the last 168 hours. Coagulation Profile: No results for input(s): INR, PROTIME in the last 168 hours. Cardiac Enzymes: No results for input(s): CKTOTAL, CKMB, CKMBINDEX, TROPONINI in the last 168 hours. BNP (last 3 results) No results for input(s): PROBNP in the last 8760 hours. HbA1C: No results for input(s): HGBA1C in the last 72 hours. CBG: Recent Labs  Lab 09/14/19 1809 09/15/19 0007 09/15/19 0524 09/15/19 0825 09/15/19 1055  GLUCAP 119* 121*  121* 149*  149* 132*  132* 124*   Lipid Profile: Recent Labs    09/14/19 0350  TRIG 106   Thyroid Function Tests: No results for input(s): TSH, T4TOTAL, FREET4, T3FREE, THYROIDAB in the last 72 hours. Anemia Panel: No results for input(s): VITAMINB12, FOLATE, FERRITIN, TIBC, IRON, RETICCTPCT in the last 72 hours. Sepsis Labs: Recent Labs  Lab 09/09/19 0151  LATICACIDVEN 1.0    Recent Results (from the past 240 hour(s))  Urine C&S     Status: Abnormal   Collection Time: 09/08/19  8:22 PM   Specimen: Urine, Catheterized  Result Value Ref Range Status   Specimen Description   Final    URINE, CATHETERIZED Performed at [facility], [address] [place], [place] [address]    Special Requests NONE  Final   Culture (A)  Final    <10,000 COLONIES/mL INSIGNIFICANT GROWTH Performed at [facility], [address] [place], [place] [address]    Report Status 09/10/2019 FINAL  Final  SARS Coronavirus 2 by RT PCR (hospital order, performed in [facility] hospital lab) Nasopharyngeal Nasopharyngeal Swab     Status: None   Collection Time: 09/08/19 11:30 PM   Specimen: Nasopharyngeal Swab   Result Value Ref Range Status   SARS Coronavirus 2 NEGATIVE NEGATIVE Final    Comment: (NOTE) SARS-CoV-2 target nucleic acids are NOT DETECTED.  The SARS-CoV-2 RNA is generally detectable in upper and lower respiratory specimens during the acute phase of infection. The lowest concentration of SARS-CoV-2 viral copies this assay can detect is 250 copies / mL. A negative result does not preclude SARS-CoV-2 infection and should not be used as the sole basis for treatment or other patient management decisions.  A negative result may occur with improper specimen collection / handling, submission of specimen other than nasopharyngeal swab, presence of viral mutation(s) within the  areas targeted by this assay, and inadequate number of viral copies (<250 copies / mL). A negative result must be combined with clinical observations, patient history, and epidemiological information.  Fact Sheet for Patients:   BoilerBrush.com.cy  Fact Sheet for Healthcare Providers: https://pope.com/  This test is not yet approved or  cleared by the [place] FDA and has been authorized for detection and/or diagnosis of SARS-CoV-2 by FDA under an Emergency Use Authorization (EUA).  This EUA will remain in effect (meaning this test can be used) for the duration of the COVID-19 declaration under Section 564(b)(1) of the Act, 21 U.S.C. section 360bbb-3(b)(1), unless the authorization is terminated or revoked sooner.  Performed at [facility], [address], [place], [place] [address]   Surgical PCR screen     Status: None   Collection Time: 09/10/19  4:32 AM   Specimen: Nasal Mucosa; Nasal Swab  Result Value Ref Range Status   MRSA, PCR NEGATIVE NEGATIVE Final   Staphylococcus aureus NEGATIVE NEGATIVE Final    Comment: (NOTE) The Xpert SA Assay (FDA approved for NASAL specimens in patients 16 years of age and older), is one component  of a comprehensive surveillance program. It is not intended to diagnose infection nor to guide or monitor treatment. Performed at [facility], [address] [place], [place] [address]       Radiology Studies: No results found.    Scheduled Meds: . atorvastatin  20 mg Oral Daily  . Chlorhexidine Gluconate Cloth  6 each Topical Daily  . digoxin  0.25 mg Oral Daily  . heparin  5,000 Units Subcutaneous Q8H  . insulin aspart  0-9 Units Subcutaneous TID WC  . pyridostigmine  60 mg Oral TID  . sodium chloride flush  10-40 mL Intracatheter Q12H  . tamsulosin  0.4 mg Oral Daily  . triamterene-hydrochlorothiazide  1.5 tablet Oral Daily   Continuous Infusions: . piperacillin-tazobactam (ZOSYN)  IV 12.5 mL/hr at 09/15/19 0600  . TPN ADULT (ION) 40 mL/hr at 09/15/19 0851  . TPN ADULT (ION)        Assessment/Plan:  1.  Sepsis secondary to acute gangrenous appendicitis/peritonitis: Present on admission with fever, leukocytosis, tachycardia, although lactate normal.  S/p appendectomy for acute gangrenous appendicitis with perforation/purulent peritonitis requiring JP drain placement.  Postoperatively developed ileus and been n.p.o. with IV fluids over the weekend.  Currently on TPN and IV Zosyn.  Leukocytosis currently resolved.  Defer JP drain management per GS.  Patient has orders for oxycodone and IV methocarbamol as needed and has required neither.  DCed IV methocarbamol in concern for exacerbating problem #2.   Resume Eliquis today if okay per general surgery--Per my discussion yesterday, they preferred to wait another day.  2.  Postoperative ileus: General surgery following along.  Patient has history of congenital intestinal malrotation, is familiar with NG tube and process but has not required NG tube in this hospitalization.  N.p.o./IV fluids/TPN over the weekend.  Patient reports tolerance with CLD, will advance to full liquids and await general surgery follow-up  for further recommendations.  Possibly can advance diet slowly to regular diet in a.m.  and anticipate going home once tolerating regular diet and cleared by GS.  Transitioned all medications to oral.Taper TPN to off-requested pharmacy assistance  3.  Chronic A. fib with, RVR in this hospitalization: Currently rate controlled and on IV digoxin. Evaluated by cardiology (Dr. Royann Shivers reviewed notes) for preoperative risk assessment and given recent normal echo/nuclear stress test  from November 2020, no further work-up was recommended other than holding anticoagulation until general surgery clearance-will discuss with GS and resume Eliquis today.  Change other medications to oral.  4.  Myasthenia gravis: Being followed by RT for NIF with good effort documented.  Patient noted to be using 1-2 L O2, will taper to off as tolerated.  Ordered incentive spirometry and encourage patient to use.  Resumed on pyridostigmine.  5.  Hypertension: On triamterene/HCTZ  6.  BPH: On tamsulosin.  7.  Hyperlipidemia: On statins  8.  Hypokalemia: Ordered replacement.  9.  Sleep apnea:   Does not use O2 at home but been using 1 to 2 L while here.  Taper O2 to off and monitor for any true hypoxia.  May resume CPAP upon discharge.  DVT prophylaxis: Heparin every 8, resume Eliquis today if okay per GS Code Status: Full code Family / Patient Communication: Discussed with patient and wife over the phone at bedside. Disposition Plan:   Status is: Inpatient  Remains inpatient appropriate because:IV treatments appropriate due to intensity of illness or inability to take PO   Dispo: The patient is from: Home              Anticipated d/c is to: Home              Anticipated d/c date is: 1 days              Patient currently is not medically stable to d/c.          Time spent: 35 minutes     >50% time spent in discussions with care team and coordination of care.    Alessandra Bevels, MD [facility] Pager in [place]  If 7PM-7AM, please contact night-coverage [email or web address] 09/15/2019, 12:14 PM

## 2019-09-15 NOTE — Progress Notes (Signed)
PT Cancellation Note  Patient Details Name: [name] MRN: [id number] DOB: 1945-07-09   Cancelled Treatment:    Reason Eval/Treat Not Completed: Patient declined, recently ambulated. Reports difficulty with sit to stand. PT will evaluate tomorrow.    Rada Hay 09/15/2019, 4:16 PM Blanchard Kelch PT Acute Rehabilitation Services Pager [phone] Office [phone]

## 2019-09-16 ENCOUNTER — Inpatient Hospital Stay (HOSPITAL_COMMUNITY): Payer: Medicare PPO

## 2019-09-16 LAB — BASIC METABOLIC PANEL
Anion gap: 8 (ref 5–15)
BUN: 19 mg/dL (ref 8–23)
CO2: 27 mmol/L (ref 22–32)
Calcium: 8.8 mg/dL — ABNORMAL LOW (ref 8.9–10.3)
Chloride: 102 mmol/L (ref 98–111)
Creatinine, Ser: 0.66 mg/dL (ref 0.61–1.24)
GFR calc Af Amer: >60 mL/min (ref >60)
GFR calc non Af Amer: >60 mL/min (ref >60)
Glucose, Bld: 100 mg/dL — ABNORMAL HIGH (ref 70–99)
Potassium: 3.6 mmol/L (ref 3.5–5.1)
Sodium: 137 mmol/L (ref 135–145)

## 2019-09-16 LAB — CBC
HCT: 45.8 % (ref 39.0–52.0)
Hemoglobin: 15.1 g/dL (ref 13.0–17.0)
MCH: 32.1 pg (ref 26.0–34.0)
MCHC: 33 g/dL (ref 30.0–36.0)
MCV: 97.4 fL (ref 80.0–100.0)
Platelets: 222 10*3/uL (ref 150–400)
RBC: 4.7 MIL/uL (ref 4.22–5.81)
RDW: 13.1 % (ref 11.5–15.5)
WBC: 12.7 10*3/uL — ABNORMAL HIGH (ref 4.0–10.5)
nRBC: 0 % (ref 0.0–0.2)

## 2019-09-16 LAB — GLUCOSE, CAPILLARY
Glucose-Capillary: 99 mg/dL (ref 70–99)
Glucose-Capillary: 129 mg/dL — ABNORMAL HIGH (ref 70–99)
Glucose-Capillary: 105 mg/dL — ABNORMAL HIGH (ref 70–99)

## 2019-09-16 MED ORDER — SENNOSIDES-DOCUSATE SODIUM 8.6-50 MG PO TABS: 2.0000 | ORAL_TABLET | Freq: Every evening | ORAL | Status: DC | PRN

## 2019-09-16 MED ORDER — POTASSIUM CHLORIDE CRYS ER 20 MEQ PO TBCR
40.0000 meq | EXTENDED_RELEASE_TABLET | Freq: Once | ORAL | Status: AC
  Administered 2019-09-16: 40 meq via ORAL
  Filled 2019-09-16: qty 2

## 2019-09-16 MED ORDER — POLYETHYLENE GLYCOL 3350 17 G PO PACK: 17.0000 g | PACK | Freq: Every day | ORAL | Status: DC | PRN

## 2019-09-16 NOTE — Evaluation (Signed)
Physical Therapy Evaluation Only Patient Details Name: ASHAAD GAERTNER MRN: [id number] DOB: 11/17/1945 Today's Date: 09/16/2019   History of Present Illness  74 year old male with a history of congenital intestinal malrotation, A. fib on Eliquis, myasthenia gravis, HTN, hyperlipidemia, GERD presented to ED with fever, abdominal pain and distention.  Patient reported that he had a urological procedure done on Monday, 7/27 at [facility] urology and Foley catheter was placed. W/U in the ED revealed sepsis due to acute appendicitis. Pt s/p laparoscopic appendectomy with JP drain placement on 7/29.  Clinical Impression  Physical therapy evaluation completed, patient is ambulating throughout hallways with nursing and no physical assist, also able to ambulate in room with PT without deficits noted. Pt initially requiring some assist with supine<>sit, but heavy education on log rolling, exhale, and overall bed mobility technique with good carryover and pt able to perform with SUPV by EOS. No further PT services recommended at this time. Patient discharged to care of nursing for ambulation daily as tolerated for length of stay.     Follow Up Recommendations No PT follow up;Supervision - Intermittent    Equipment Recommendations  None recommended by PT (wife getting RW and BSC in community)    Recommendations for Other Services       Precautions / Restrictions Precautions Precautions: None Precaution Comments: JP drain Restrictions Weight Bearing Restrictions: No      Mobility  Bed Mobility Overal bed mobility: Modified Independent;Needs Assistance Bed Mobility: Supine to Sit;Sit to Supine  Supine to sit: Min assist;Supervision Sit to supine: Min assist;Supervision  General bed mobility comments: educated pt on hand position, log rolling, avoiding breath holding; initially needing min A for supine<>sit through sidelying via log roll, but progressed to SUPV with education and simulating home  set up  Transfers Overall transfer level: Needs assistance Equipment used: Rolling walker (2 wheeled) Transfers: Sit to/from [employer] Sit to Stand: Supervision Stand pivot transfers: Supervision  General transfer comment: positioned bed at approximate bed height at home, cues to push through bil flat feet to engage BLE, BUE assisting on bed to power up, no physical assist  Ambulation/Gait Ambulation/Gait assistance: Modified independent (Device/Increase time) Gait Distance (Feet): 30 Feet Assistive device: Rolling walker (2 wheeled);None Gait Pattern/deviations: WFL(Within Functional Limits) Gait velocity: slightly decreased   General Gait Details: ambulates around room/restroom with RW initially and progressing to no AD, good steadiness, able to clear past obstacles and navigate bathroom without LOB or physical assist  Stairs            Wheelchair Mobility    Modified Rankin (Stroke Patients Only)       Balance Overall balance assessment: Modified Independent          Pertinent Vitals/Pain Pain Assessment: Faces Faces Pain Scale: Hurts little more Pain Location: abdomen Pain Descriptors / Indicators: Aching;Sore Pain Intervention(s): Limited activity within patient's tolerance;Monitored during session    Home Living Family/patient expects to be discharged to:: Private residence Living Arrangements: Spouse/significant other Available Help at Discharge: Neighbor;Available PRN/intermittently Type of Home: House Home Access: Level entry     Home Layout: Two level;Able to live on main level with bedroom/bathroom (basement with storage) Home Equipment: Shower seat - built in;Grab bars - tub/shower Additional Comments: Pt reports wife is currently getting BSC and RW.    Prior Function Level of Independence: Independent         Comments: Pt reports independent with ADLs, community ambulation without AD, drives, walks 6-10 miles/day for  exercise.  Hand Dominance        Extremity/Trunk Assessment   Upper Extremity Assessment Upper Extremity Assessment: Overall WFL for tasks assessed    Lower Extremity Assessment Lower Extremity Assessment: Overall WFL for tasks assessed    Cervical / Trunk Assessment Cervical / Trunk Assessment: Normal  Communication   Communication: No difficulties  Cognition Arousal/Alertness: Awake/alert Behavior During Therapy: WFL for tasks assessed/performed Overall Cognitive Status: Within Functional Limits for tasks assessed       General Comments      Exercises     Assessment/Plan    PT Assessment Patent does not need any further PT services  PT Problem List         PT Treatment Interventions      PT Goals (Current goals can be found in the Care Plan section)  Acute Rehab PT Goals Patient Stated Goal: get in/out of bed independently PT Goal Formulation: With patient Time For Goal Achievement: 09/16/19 Potential to Achieve Goals: Good    Frequency     Barriers to discharge        Co-evaluation               AM-PAC PT "6 Clicks" Mobility  Outcome Measure Help needed turning from your back to your side while in a flat bed without using bedrails?: A Little Help needed moving from lying on your back to sitting on the side of a flat bed without using bedrails?: A Little Help needed moving to and from a bed to a chair (including a wheelchair)?: A Little Help needed standing up from a chair using your arms (e.g., wheelchair or bedside chair)?: A Little Help needed to walk in hospital room?: None Help needed climbing 3-5 steps with a railing? : None 6 Click Score: 20    End of Session   Activity Tolerance: Patient tolerated treatment well Patient left: in bed;with call bell/phone within reach Nurse Communication: Mobility status PT Visit Diagnosis: Other abnormalities of gait and mobility (R26.89)    Time: 1761-6073 PT Time Calculation (min) (ACUTE  ONLY): 31 min   Charges:   PT Evaluation $PT Eval Low Complexity: 1 Low PT Treatments $Therapeutic Activity: 8-22 mins        Tori Madyn Ivins PT, DPT 09/16/19, 12:26 PM

## 2019-09-16 NOTE — Progress Notes (Signed)
[facility] Progress Note  6 Days Post-Op  Subjective: CC-  Feeling a little better each day. Abdominal pain is less. Denies n/v. Tolerating full liquids. He had 1 small and 1 large semisolid BMs this AM. He was switched from zosyn to augmentin yesterday by primary team. WBC up 12.7, afebrile Denies dysuria, CP, SOB, cough, extremity pain/swelling.  Objective: Vital signs in last 24 hours: Temp:  [97.8 F (36.6 C)-98.1 F (36.7 C)] 97.8 F (36.6 C) (08/04 0432) Pulse Rate:  [62-75] 62 (08/04 0432) Resp:  [16-17] 16 (08/04 0432) BP: (115-121)/(77-84) 119/84 (08/04 0432) SpO2:  [96 %-99 %] 97 % (08/04 0432) Last BM Date: 09/15/19  Intake/Output from previous day: 08/03 0701 - 08/04 0700 In: 1797.4 [P.O.:720; I.V.:1077.4] Out: 1800 [Urine:1800] Intake/Output this shift: Total I/O In: 540 [P.O.:540] Out: 100 [Urine:100]  PE: Gen:  Alert, NAD, pleasant Pulm:  CTAB, rate and effort normal Cardio: RRR Abd: Soft, nondistended, mild lower abdominal TTP without rebound or guarding, +BS, incisions cdi with staples intact and no erythema or drainage, JP drain with small amount seroserous drainage Psych: A&Ox3 Ext: calves soft and nontender without edema  Lab Results:  Recent Labs    09/14/19 0350 09/16/19 0300  WBC 11.0* 12.7*  HGB 15.2 15.1  HCT 45.9 45.8  PLT 189 222   BMET Recent Labs    09/14/19 0350 09/16/19 0300  NA 138 137  K 4.2 3.6  CL 101 102  CO2 28 27  GLUCOSE 130* 100*  BUN 19 19  CREATININE 0.66 0.66  CALCIUM 8.6* 8.8*   PT/INR No results for input(s): LABPROT, INR in the last 72 hours. CMP     Component Value Date/Time   NA 137 09/16/2019 0300   NA 141 08/05/2019 1311   K 3.6 09/16/2019 0300   CL 102 09/16/2019 0300   CO2 27 09/16/2019 0300   GLUCOSE 100 (H) 09/16/2019 0300   BUN 19 09/16/2019 0300   BUN 25 08/05/2019 1311   CREATININE 0.66 09/16/2019 0300   CREATININE 0.65 (L) 01/13/2015 1035   CALCIUM 8.8 (L) 09/16/2019  0300   PROT 6.2 (L) 09/14/2019 0350   PROT 6.4 08/05/2019 1311   ALBUMIN 3.0 (L) 09/14/2019 0350   ALBUMIN 4.3 08/05/2019 1311   AST 42 (H) 09/14/2019 0350   ALT 38 09/14/2019 0350   ALKPHOS 53 09/14/2019 0350   BILITOT 0.7 09/14/2019 0350   BILITOT 0.5 08/05/2019 1311   GFRNONAA >60 09/16/2019 0300   GFRAA >60 09/16/2019 0300   Lipase     Component Value Date/Time   LIPASE 26 09/08/2019 2224       Studies/Results: No results found.  Anti-infectives: Anti-infectives (From admission, onward)   Start     Dose/Rate Route Frequency Ordered Stop   09/15/19 2200  amoxicillin-clavulanate (AUGMENTIN) 875-125 MG per tablet 1 tablet     Discontinue     1 tablet Oral Every 12 hours 09/15/19 1358     09/09/19 0600  piperacillin-tazobactam (ZOSYN) IVPB 3.375 g  Status:  Discontinued        3.375 g 12.5 mL/hr over 240 Minutes Intravenous Every 8 hours 09/09/19 0142 09/15/19 1358   09/08/19 2245  piperacillin-tazobactam (ZOSYN) IVPB 3.375 g        3.375 g 100 mL/hr over 30 Minutes Intravenous  Once 09/08/19 2233 09/09/19 0032       Assessment/Plan Sepsis secondary to acute appendicitis Myasthenia gravis- Mestion, CellCept Chronic AF - Digoxin, Eliquis Hypertension BPH BMI 30.85 Thrombocytopenia -  resolved ModerateProtein calorie malnutrition -7.9>>15.3(8/2)  Acute gangrenous appendicitis with perforation at the base of the appendix, purulent peritonitis - S/p Laparoscopic appendectomy with closure of colon perforation(5 mm trocar injury and avulsion of the appendix from the cecum,)Blake drain placement, 09/10/2019, Dr. Luretha Murphy -POD #6 - post op ileus-improving - continue JP drain for now and monitor output, may be able to come out prior to discharge it if remain serous and low volume  FEN: soft diet ID: Zosyn 7/28>>8/3, augmentin 8/3>> DVT: Heparin SQ Follow-up: Dr. Daphine Deutscher  Plan: Advance to soft diet. Ok to stop TPN after today's bag. PT consult pending.   Patient clinically improving but WBC is up trending. He is high risk for developing a postop complication. Will obtain CT scan abdomen/pelvis to rule out intraabdominal abscess. Continue antibiotics. Continue holding eliquis.    LOS: 8 days    Franne Forts, [facility] 09/16/2019, 12:13 PM Please see Amion for pager number during day hours 7:00am-4:30pm

## 2019-09-16 NOTE — Discharge Instructions (Addendum)
[facility]  Please arrive at least 30 min before your appointment to complete your check in paperwork.  If you are unable to arrive 30 min prior to your appointment time we may have to cancel or reschedule you. LAPAROSCOPIC SURGERY: POST OP INSTRUCTIONS Always review your discharge instruction sheet given to you by the facility where your surgery was performed. IF YOU HAVE DISABILITY OR FAMILY LEAVE FORMS, YOU MUST BRING THEM TO THE OFFICE FOR PROCESSING.   DO NOT GIVE THEM TO YOUR DOCTOR.  PAIN CONTROL  1. First take acetaminophen (Tylenol) AND/or ibuprofen (Advil) to control your pain after surgery.  Follow directions on package.  Taking acetaminophen (Tylenol) and/or ibuprofen (Advil) regularly after surgery will help to control your pain and lower the amount of prescription pain medication you may need.  You should not take more than 4,000 mg (4 grams) of acetaminophen (Tylenol) in 24 hours.  You should not take ibuprofen (Advil), aleve, motrin, naprosyn or other NSAIDS if you have a history of stomach ulcers or chronic kidney disease.  2. A prescription for pain medication may be given to you upon discharge.  Take your pain medication as prescribed, if you still have uncontrolled pain after taking acetaminophen (Tylenol) or ibuprofen (Advil). 3. Use ice packs to help control pain. 4. If you need a refill on your pain medication, please contact your pharmacy.  They will contact our office to request authorization. Prescriptions will not be filled after 5pm or on week-ends.  HOME MEDICATIONS 5. Take your usually prescribed medications unless otherwise directed.  DIET 6. You should follow a light diet the first few days after arrival home.  Be sure to include lots of fluids daily. Avoid fatty, fried foods.   CONSTIPATION 7. It is common to experience some constipation after surgery and if you are taking pain medication.  Increasing fluid intake and taking a stool  softener (such as Colace) will usually help or prevent this problem from occurring.  A mild laxative (Milk of Magnesia or Miralax) should be taken according to package instructions if there are no bowel movements after 48 hours.  WOUND/INCISION CARE 8. Most patients will experience some swelling and bruising in the area of the incisions.  Ice packs will help.  Swelling and bruising can take several days to resolve.  9. Unless discharge instructions indicate otherwise, follow guidelines below  a. STERI-STRIPS - you may remove your outer bandages 48 hours after surgery, and you may shower at that time.  You have steri-strips (small skin tapes) in place directly over the incision.  These strips should be left on the skin for 7-10 days.   b. DERMABOND/SKIN GLUE - you may shower in 24 hours.  The glue will flake off over the next 2-3 weeks. 10. Any sutures or staples will be removed at the office during your follow-up visit.  ACTIVITIES 11. You may resume regular (light) daily activities beginning the next day--such as daily self-care, walking, climbing stairs--gradually increasing activities as tolerated.  You may have sexual intercourse when it is comfortable.  Refrain from any heavy lifting or straining until approved by your doctor. a. You may drive when you are no longer taking prescription pain medication, you can comfortably wear a seatbelt, and you can safely maneuver your car and apply brakes.  FOLLOW-UP 12. You should see your doctor in the office for a follow-up appointment approximately 2-3 weeks after your surgery.  You should have been given your post-op/follow-up appointment when   your surgery was scheduled.  If you did not receive a post-op/follow-up appointment, make sure that you call for this appointment within a day or two after you arrive home to insure a convenient appointment time.  OTHER INSTRUCTIONS  WHEN TO CALL YOUR DOCTOR: 1. Fever over 101.0 2. Inability to  urinate 3. Continued bleeding from incision. 4. Increased pain, redness, or drainage from the incision. 5. Increasing abdominal pain  The clinic staff is available to answer your questions during regular business hours.  Please don't hesitate to call and ask to speak to one of the nurses for clinical concerns.  If you have a medical emergency, go to the nearest emergency room or call 911.  A surgeon from [facility] is always on call at the hospital. [address], [address], [place], [place]  [address] ? [address] [address], [place], [place]   [address] [phone] ? [phone] ? FAX [phone]    GETTING TO GOOD BOWEL HEALTH. You can review this with Dr. Matthias Hughs also. Irregular bowel habits such as constipation and diarrhea can lead to many problems over time.  Having one soft bowel movement a day is the most important way to prevent further problems.  The anorectal canal is designed to handle stretching and feces to safely manage our ability to get rid of solid waste (feces, poop, stool) out of our body.  BUT, hard constipated stools can act like ripping concrete bricks and diarrhea can be a burning fire to this very sensitive area of our body, causing inflamed hemorrhoids, anal fissures, increasing risk is perirectal abscesses, abdominal pain/bloating, an making irritable bowel worse.     The goal: ONE SOFT BOWEL MOVEMENT A DAY!  To have soft, regular bowel movements:  . Drink at least 8 tall glasses of water a day.   . Take plenty of fiber.  Fiber is the undigested part of plant food that passes into the colon, acting s "natures broom" to encourage bowel motility and movement.  Fiber can absorb and hold large amounts of water. This results in a larger, bulkier stool, which is soft and easier to pass. Work gradually over several weeks up to 6 servings a day of fiber (25g a day even more if needed) in the form of: o Vegetables -- Root (potatoes, carrots, turnips), leafy green  (lettuce, salad greens, celery, spinach), or cooked high residue (cabbage, broccoli, etc) o Fruit -- Fresh (unpeeled skin & pulp), Dried (prunes, apricots, cherries, etc ),  or stewed ( applesauce)  o Whole grain breads, pasta, etc (whole wheat)  o Bran cereals  . Bulking Agents -- This type of water-retaining fiber generally is easily obtained each day by one of the following:  o Psyllium bran -- The psyllium plant is remarkable because its ground seeds can retain so much water. This product is available as Metamucil, Konsyl, Effersyllium, Per Diem Fiber, or the less expensive generic preparation in drug and health food stores. Although labeled a laxative, it really is not a laxative.  o Methylcellulose -- This is another fiber derived from wood which also retains water. It is available as Citrucel. o Polyethylene Glycol - and "artificial" fiber commonly called Miralax or Glycolax.  It is helpful for people with gassy or bloated feelings with regular fiber o Flax Seed - a less gassy fiber than psyllium . No reading or other relaxing activity while on the toilet. If bowel movements take longer than 5 minutes, you are too constipated . AVOID CONSTIPATION.  High fiber and water intake usually takes care of this.  Sometimes a laxative is needed to stimulate more frequent bowel movements, but  . Laxatives are not a good long-term solution as it can wear the colon out. o Osmotics (Milk of Magnesia, Fleets phosphosoda, Magnesium citrate, MiraLax, GoLytely) are safer than  o Stimulants (Senokot, Castor Oil, Dulcolax, Ex Lax)    o Do not take laxatives for more than 7days in a row. .  IF SEVERELY CONSTIPATED, try a Bowel Retraining Program: o Do not use laxatives.  o Eat a diet high in roughage, such as bran cereals and leafy vegetables.  o Drink six (6) ounces of prune or apricot juice each morning.  o Eat two (2) large servings of stewed fruit each day.  o Take one (1) heaping tablespoon of a  psyllium-based bulking agent twice a day. Use sugar-free sweetener when possible to avoid excessive calories.  o Eat a normal breakfast.  o Set aside 15 minutes after breakfast to sit on the toilet, but do not strain to have a bowel movement.  o If you do not have a bowel movement by the third day, use an enema and repeat the above steps.  . Controlling diarrhea o Switch to liquids and simpler foods for a few days to avoid stressing your intestines further. o Avoid dairy products (especially milk & ice cream) for a short time.  The intestines often can lose the ability to digest lactose when stressed. o Avoid foods that cause gassiness or bloating.  Typical foods include beans and other legumes, cabbage, broccoli, and dairy foods.  Every person has some sensitivity to other foods, so listen to our body and avoid those foods that trigger problems for you. o Adding fiber (Citrucel, Metamucil, psyllium, Miralax) gradually can help thicken stools by absorbing excess fluid and retrain the intestines to act more normally.  Slowly increase the dose over a few weeks.  Too much fiber too soon can backfire and cause cramping & bloating. o Probiotics (such as active yogurt, Align, etc) may help repopulate the intestines and colon with normal bacteria and calm down a sensitive digestive tract.  Most studies show it to be of mild help, though, and such products can be costly. o Medicines: - Bismuth subsalicylate (ex. Kayopectate, Pepto Bismol) every 30 minutes for up to 6 doses can help control diarrhea.  Avoid if pregnant. - Loperamide (Immodium) can slow down diarrhea.  Start with two tablets (4mg  total) first and then try one tablet every 6 hours.  Avoid if you are having fevers or severe pain.  If you are not better or start feeling worse, stop all medicines and call your doctor for advice o Call your doctor if you are getting worse or not better.  Sometimes further testing (cultures, endoscopy, X-ray studies,  bloodwork, etc) may be needed to help diagnose and treat the cause of the diarrhea.

## 2019-09-16 NOTE — Progress Notes (Signed)
Pharmacy: TPN  Patient reported consuming 100% of his lunch and dinner on 8/3 and 100% of his breakfast on 8/4.  Per Dr. Nelson Chimes, ok to d/c TPN today.  Plan: - Will D/c TPN when bag runs out at [facility] today - Pharmacy will sign off. Re-consult [place] if further assistance is needed  Dorna Leitz, PharmD, BCPS 09/16/2019 10:52 AM

## 2019-09-16 NOTE — Progress Notes (Signed)
PROGRESS NOTE    [name]  NFA:[id number] DOB: 04-22-1945 DOA: 09/08/2019 PCP: [name], MD   Brief Narrative:  74 year old male with a history of congenital intestinal malrotation, A. fib on Eliquis, myasthenia gravis on CellCept, hypertension, hyperlipidemia, GERD presented to ED with fever, abdominal pain and distention. Patient reported that he had a urological procedure done on Monday, 7/27 at [facility] and Foley catheter was placed.  In the ER left lower quadrant pain work-up revealed acute appendicitis, underwent laparoscopic appendectomy with JP drain placement on 7/29 for acute gangrenous appendicitis with perforation.  Developed postop ileus.   Assessment & Plan:   Principal Problem:   Acute appendicitis Active Problems:   Hyperlipidemia   Myasthenia gravis (HCC)   Hypertension   Chronic atrial fibrillation (HCC)  Sepsis secondary to acute gangrenous appendicitis/peritonitis:  Status post laparoscopic appendectomy 7/29 with JP drain in place -Sepsis physiology has resolved -Slow recovery, pain control, bowel regimen -Stop TPN, advance to soft diet. -IV Zosyn transition to Augmentin -Resume Eliquis once cleared by surgery -Minimize narcotic and methocarbamol use due to ileus   Postoperative ileus -Has history of congenital intestinal malformation.  Postop ileus perhaps slowly improving. -Stop TPN, advance to soft diet. -Once tolerating oral, transition to Eliquis -Out of bed to chair  Chronic A. fib with, RVR in this hospitalization:  -Continue digoxin 0.25 mg daily -Seen by cardiology for preop evaluation. -Supportive care. -Plans to resume Eliquis once cleared by surgery  Myasthenia gravis:  -RT following for NIF.  Wean off oxygen as appropriate -Incentive spirometer -Continue Mestinon 3 times daily  Essential hypertension:  -On triamterene/HCTZ  BPH:  -On tamsulosin.  Hyperlipidemia:  -On statins  Obstructive Sleep  apnea:    -Supplemental oxygen, resume CPAP upon discharge   DVT prophylaxis:  Subcu heparin, transition to Eliquis when cleared by surgery. Code Status: Full code Family / Patient Communication:  Disposition Plan:   Status is: Inpatient  Remains inpatient appropriate because:IV treatments appropriate due to intensity of illness or inability to take PO   Dispo: The patient is from: Home  Anticipated d/c is to: Home  Anticipated d/c date is: 1-2 days  Patient currently is not medically stable to d/c.  Discharge home once cleared by general surgery.  Planning to stop TPN and slowly transitioning to only p.o.  Body mass index is 30.85 kg/m.     Subjective: Patient feels much better passing gas and had bowel movement yesterday.  Denies any abdominal pain nausea and vomiting.  Wants to advance his diet.  Review of Systems Otherwise negative except as per HPI, including: General: Denies fever, chills, night sweats or unintended weight loss. Resp: Denies cough, wheezing, shortness of breath. Cardiac: Denies chest pain, palpitations, orthopnea, paroxysmal nocturnal dyspnea. GI: Denies abdominal pain, nausea, vomiting, diarrhea or constipation GU: Denies dysuria, frequency, hesitancy or incontinence MS: Denies muscle aches, joint pain or swelling Neuro: Denies headache, neurologic deficits (focal weakness, numbness, tingling), abnormal gait Psych: Denies anxiety, depression, SI/HI/AVH Skin: Denies new rashes or lesions ID: Denies sick contacts, exotic exposures, travel  Examination:  General exam: Appears calm and comfortable  Respiratory system: Clear to auscultation. Respiratory effort normal. Cardiovascular system: S1 & S2 heard, RRR. No JVD, murmurs, rubs, gallops or clicks. No pedal edema. Gastrointestinal system: Abdomen is nondistended, soft and nontender. No organomegaly or masses felt. Normal bowel sounds heard. Central nervous  system: Alert and oriented. No focal neurological deficits. Extremities: Symmetric 5 x 5 power. Skin: Surgical incision noted on  the abdomen. Psychiatry: Judgement and insight appear normal. Mood & affect appropriate.   Right upper extremity PICC line in place JP drain in place  Objective: Vitals:   09/15/19 0522 09/15/19 1344 09/15/19 1952 09/16/19 0432  BP: 109/76 115/80 121/77 119/84  Pulse: 73 75 63 62  Resp: 18 16 17 16   Temp: 97.7 F (36.5 C) 98.1 F (36.7 C) 97.9 F (36.6 C) 97.8 F (36.6 C)  TempSrc: Oral Oral Oral Oral  SpO2: 99% 99% 96% 97%  Weight:      Height:        Intake/Output Summary (Last 24 hours) at 09/16/2019 0729 Last data filed at 09/16/2019 0600 Gross per 24 hour  Intake 1797.37 ml  Output 1800 ml  Net -2.63 ml   Filed Weights   09/08/19 2010  Weight: 89.4 kg     Data Reviewed:   CBC: Recent Labs  Lab 09/10/19 0301 09/10/19 0301 09/11/19 0304 09/12/19 0424 09/13/19 0345 09/14/19 0350 09/16/19 0300  WBC 11.7*   < > 9.0 7.9 8.4 11.0* 12.7*  NEUTROABS 10.3*  --   --  6.0  --  8.1*  --   HGB 14.6   < > 15.2 13.6 14.3 15.2 15.1  HCT 45.2   < > 46.8 41.6 44.2 45.9 45.8  MCV 100.7*   < > 99.4 98.1 97.6 97.9 97.4  PLT 137*   < > 166 165 179 189 222   < > = values in this interval not displayed.   Basic Metabolic Panel: Recent Labs  Lab 09/11/19 0304 09/12/19 0424 09/13/19 0345 09/14/19 0350 09/16/19 0300  NA 134* 139 138 138 137  K 3.7 3.2* 3.3* 4.2 3.6  CL 99 100 102 101 102  CO2 24 27 27 28 27   GLUCOSE 151* 131* 137* 130* 100*  BUN 22 23 20 19 19   CREATININE 0.76 0.65 0.48* 0.66 0.66  CALCIUM 8.3* 8.5* 8.3* 8.6* 8.8*  MG  --  2.0 2.0 2.1  --   PHOS  --  2.3* 3.7 3.5  --    GFR: Estimated Creatinine Clearance: 85.1 mL/min (by C-G formula based on SCr of 0.66 mg/dL). Liver Function Tests: Recent Labs  Lab 09/12/19 0424 09/14/19 0350  AST 21 42*  ALT 18 38  ALKPHOS 44 53  BILITOT 0.6 0.7  PROT 5.6* 6.2*  ALBUMIN 2.7*  3.0*   No results for input(s): LIPASE, AMYLASE in the last 168 hours. No results for input(s): AMMONIA in the last 168 hours. Coagulation Profile: No results for input(s): INR, PROTIME in the last 168 hours. Cardiac Enzymes: No results for input(s): CKTOTAL, CKMB, CKMBINDEX, TROPONINI in the last 168 hours. BNP (last 3 results) No results for input(s): PROBNP in the last 8760 hours. HbA1C: No results for input(s): HGBA1C in the last 72 hours. CBG: Recent Labs  Lab 09/15/19 0524 09/15/19 0825 09/15/19 1055 09/15/19 1656 09/16/19 0643  GLUCAP 149*  149* 132*  132* 124* 105* 99   Lipid Profile: Recent Labs    09/14/19 0350  TRIG 106   Thyroid Function Tests: No results for input(s): TSH, T4TOTAL, FREET4, T3FREE, THYROIDAB in the last 72 hours. Anemia Panel: No results for input(s): VITAMINB12, FOLATE, FERRITIN, TIBC, IRON, RETICCTPCT in the last 72 hours. Sepsis Labs: No results for input(s): PROCALCITON, LATICACIDVEN in the last 168 hours.  Recent Results (from the past 240 hour(s))  Urine C&S     Status: Abnormal   Collection Time: 09/08/19  8:22 PM  Specimen: Urine, Catheterized  Result Value Ref Range Status   Specimen Description   Final    URINE, CATHETERIZED Performed at [facility], [address], [place], [place] [address]    Special Requests NONE  Final   Culture (A)  Final    <10,000 COLONIES/mL INSIGNIFICANT GROWTH Performed at [facility], [address], [place], [place] [address]    Report Status 09/10/2019 FINAL  Final  SARS Coronavirus 2 by RT PCR (hospital order, performed in [facility] hospital lab) Nasopharyngeal Nasopharyngeal Swab     Status: None   Collection Time: 09/08/19 11:30 PM   Specimen: Nasopharyngeal Swab  Result Value Ref Range Status   SARS Coronavirus 2 NEGATIVE NEGATIVE Final    Comment: (NOTE) SARS-CoV-2 target nucleic acids are NOT DETECTED.  The SARS-CoV-2 RNA is generally detectable in  upper and lower respiratory specimens during the acute phase of infection. The lowest concentration of SARS-CoV-2 viral copies this assay can detect is 250 copies / mL. A negative result does not preclude SARS-CoV-2 infection and should not be used as the sole basis for treatment or other patient management decisions.  A negative result may occur with improper specimen collection / handling, submission of specimen other than nasopharyngeal swab, presence of viral mutation(s) within the areas targeted by this assay, and inadequate number of viral copies (<250 copies / mL). A negative result must be combined with clinical observations, patient history, and epidemiological information.  Fact Sheet for Patients:   BoilerBrush.com.cy  Fact Sheet for Healthcare Providers: https://pope.com/  This test is not yet approved or  cleared by the [place] FDA and has been authorized for detection and/or diagnosis of SARS-CoV-2 by FDA under an Emergency Use Authorization (EUA).  This EUA will remain in effect (meaning this test can be used) for the duration of the COVID-19 declaration under Section 564(b)(1) of the Act, 21 U.S.C. section 360bbb-3(b)(1), unless the authorization is terminated or revoked sooner.  Performed at [facility], [address] [place], [place] [address]   Surgical PCR screen     Status: None   Collection Time: 09/10/19  4:32 AM   Specimen: Nasal Mucosa; Nasal Swab  Result Value Ref Range Status   MRSA, PCR NEGATIVE NEGATIVE Final   Staphylococcus aureus NEGATIVE NEGATIVE Final    Comment: (NOTE) The Xpert SA Assay (FDA approved for NASAL specimens in patients 60 years of age and older), is one component of a comprehensive surveillance program. It is not intended to diagnose infection nor to guide or monitor treatment. Performed at [facility], [address], [place], [place] [address]          Radiology Studies: No results found.      Scheduled Meds: . amoxicillin-clavulanate  1 tablet Oral Q12H  . atorvastatin  20 mg Oral Daily  . Chlorhexidine Gluconate Cloth  6 each Topical Daily  . digoxin  0.25 mg Oral Daily  . heparin  5,000 Units Subcutaneous Q8H  . insulin aspart  0-9 Units Subcutaneous TID WC  . pyridostigmine  60 mg Oral TID  . sodium chloride flush  10-40 mL Intracatheter Q12H  . tamsulosin  0.4 mg Oral Daily  . triamterene-hydrochlorothiazide  1.5 tablet Oral Daily   Continuous Infusions: . TPN ADULT (ION) 40 mL/hr at 09/15/19 1753     LOS: 8 days   Time spent= 35 mins    Billal Rollo Joline Maxcy, MD [facility]  If 7PM-7AM,  please contact night-coverage  09/16/2019, 7:29 AM

## 2019-09-17 LAB — BASIC METABOLIC PANEL
Anion gap: 9 (ref 5–15)
BUN: 22 mg/dL (ref 8–23)
CO2: 23 mmol/L (ref 22–32)
Calcium: 8.5 mg/dL — ABNORMAL LOW (ref 8.9–10.3)
Chloride: 102 mmol/L (ref 98–111)
Creatinine, Ser: 0.67 mg/dL (ref 0.61–1.24)
GFR calc Af Amer: >60 mL/min (ref >60)
GFR calc non Af Amer: >60 mL/min (ref >60)
Glucose, Bld: 88 mg/dL (ref 70–99)
Potassium: 3.8 mmol/L (ref 3.5–5.1)
Sodium: 134 mmol/L — ABNORMAL LOW (ref 135–145)

## 2019-09-17 LAB — CBC
HCT: 45.7 % (ref 39.0–52.0)
Hemoglobin: 14.9 g/dL (ref 13.0–17.0)
MCH: 32 pg (ref 26.0–34.0)
MCHC: 32.6 g/dL (ref 30.0–36.0)
MCV: 98.3 fL (ref 80.0–100.0)
Platelets: 243 10*3/uL (ref 150–400)
RBC: 4.65 MIL/uL (ref 4.22–5.81)
RDW: 13.2 % (ref 11.5–15.5)
WBC: 11.4 10*3/uL — ABNORMAL HIGH (ref 4.0–10.5)
nRBC: 0 % (ref 0.0–0.2)

## 2019-09-17 LAB — GLUCOSE, CAPILLARY
Glucose-Capillary: 91 mg/dL (ref 70–99)
Glucose-Capillary: 120 mg/dL — ABNORMAL HIGH (ref 70–99)

## 2019-09-17 MED ORDER — TRAMADOL HCL 50 MG PO TABS: 50.0000 mg | ORAL_TABLET | Freq: Four times a day (QID) | ORAL | 0 refills | Status: DC | PRN

## 2019-09-17 MED ORDER — AMOXICILLIN-POT CLAVULANATE 875-125 MG PO TABS: 1.0000 | ORAL_TABLET | Freq: Two times a day (BID) | ORAL | 0 refills | Status: AC

## 2019-09-17 MED ORDER — SACCHAROMYCES BOULARDII 250 MG PO CAPS: 250.0000 mg | ORAL_CAPSULE | Freq: Two times a day (BID) | ORAL | 0 refills | Status: AC

## 2019-09-17 MED ORDER — POTASSIUM CHLORIDE CRYS ER 20 MEQ PO TBCR
20.0000 meq | EXTENDED_RELEASE_TABLET | Freq: Once | ORAL | Status: AC
  Administered 2019-09-17: 20 meq via ORAL
  Filled 2019-09-17: qty 1

## 2019-09-17 MED ORDER — SACCHAROMYCES BOULARDII 250 MG PO CAPS
250.0000 mg | ORAL_CAPSULE | Freq: Two times a day (BID) | ORAL | Status: DC
  Administered 2019-09-17: 250 mg via ORAL
  Filled 2019-09-17: qty 1

## 2019-09-17 NOTE — Discharge Summary (Signed)
Physician Discharge Summary  [name]:[id number] DOB: 1946/01/03 DOA: 09/08/2019  PCP: [name], MD  Admit date: 09/08/2019 Discharge date: 09/17/2019  Admitted From: Home Disposition: Home  Recommendations for Outpatient Follow-up:  1. Follow up with PCP in 1-2 weeks 2. Please obtain BMP/CBC in one week your next doctors visit.  3. Resume home Eliquis 4. 2 more days of oral Augmentin per general surgery 5. Follow-up outpatient arrangements to be made by GI and general surgery 6. Probiotics prescribed   Discharge Condition: Stable CODE STATUS: Full code Diet recommendation: Heart healthy  Brief/Interim Summary: 74 year old male with a history of congenital intestinal malrotation, A. fib on Eliquis, myasthenia gravis on CellCept, hypertension, hyperlipidemia, GERD presented to ED with fever, abdominal pain and distention. Patient reported that he had a urological procedure done on Monday, 7/27 at [facility] urology and Foley catheter was placed.  In the ER left lower quadrant pain work-up revealed acute appendicitis, underwent laparoscopic appendectomy with JP drain placement on 7/29 for acute gangrenous appendicitis with perforation.  Developed postop ileus.  Briefly patient required PICC line placement for TPN which was removed once he was tolerating orals.  Repeat CT abdomen pelvis did not show any further fluid or abscess collection.  Today and patient is medically stable to be discharged with outpatient follow-up recommendations as above.   Assessment & Plan:   Principal Problem:   Acute appendicitis Active Problems:   Hyperlipidemia   Myasthenia gravis (HCC)   Hypertension   Chronic atrial fibrillation (HCC)  Sepsis secondary to acute gangrenous appendicitis/peritonitis: Status post laparoscopic appendectomy 7/29 with JP drain in place -Sepsis physiology has resolved -Patient is tolerating orals without any issues, TPN discontinued, PICC line will be  removed. -IV Zosyn transition to Augmentin, 2 more days upon discharge.  Followed by probiotics -Resume Eliquis   Postoperative ileus -Resolved, TPN stopped tolerating orals.  PICC line will be removed.  Chronic A. fib with, RVR in this hospitalization:  -Continue digoxin 0.25 mg daily -Seen by cardiology for preop evaluation. -Supportive care. -Continue home Eliquis  Myasthenia gravis:  -RT following for NIF.  Wean off oxygen as appropriate -Incentive spirometer -Continue Mestinon 3 times daily  Essential hypertension:  -On triamterene/HCTZ  BPH:  -On tamsulosin.  Hyperlipidemia: -On statins  ObstructiveSleep apnea:  -Supplemental oxygen, resume CPAP upon discharge   Body mass index is 30.85 kg/m.         Discharge Diagnoses:  Principal Problem:   Acute appendicitis Active Problems:   Hyperlipidemia   Myasthenia gravis (HCC)   Hypertension   Chronic atrial fibrillation [facility])      Consultations:  General surgery  Cardiology  Subjective: Feels great no complaints this morning tolerating orals without any issues.  Discharge Exam: Vitals:   09/16/19 2032 09/17/19 0418  BP: 108/73 121/78  Pulse: 84 66  Resp: 18 18  Temp: 98.1 F (36.7 C) 97.8 F (36.6 C)  SpO2: 96% 98%   Vitals:   09/16/19 0432 09/16/19 1338 09/16/19 2032 09/17/19 0418  BP: 119/84 116/77 108/73 121/78  Pulse: 62 91 84 66  Resp: Temp: 97.8 F (36.6 C) 97.7 F (36.5 C) 98.1 F (36.7 C) 97.8 F (36.6 C)  TempSrc: Oral Oral    SpO2: 97% 97% 96% 98%  Weight:      Height:        General: Pt is alert, awake, not in acute distress Cardiovascular: RRR, S1/S2 +, no rubs, no gallops Respiratory: CTA bilaterally, no  wheezing, no rhonchi Abdominal: Soft, NT, ND, bowel sounds + Extremities: no edema, no cyanosis Surgical abdominal scars noted.  JP drain removed by general surgery.  Discharge Instructions   Allergies as of 09/17/2019       Reactions   Demerol [meperidine]    hallucinations   Aminoglycosides    Can not use due to mysthenia gravis   Beta Adrenergic Blockers    Can not use due to mysthenia gravis   Calcium Channel Blockers    Can not use due to mysthenia gravis   Ciprofloxacin    Can not use due to mysthenia gravis   Iodinated Diagnostic Agents    Can not use due to mysthenia gravis   Penicillamine    Can not use due to mysthenia gravis   Procainamide Hcl    Can not use due to mysthenia gravis   Quinine Derivatives    Can not use due to mysthenia gravis   Succinylcholine Chloride    Can not use due to mysthenia gravis   Vecuronium Bromide [vecuronium]    Can not use due to mysthenia gravis      Medication List    STOP taking these medications   cephALEXin 500 MG capsule Commonly known as: KEFLEX   potassium chloride 10 MEQ tablet Commonly known as: KLOR-CON     TAKE these medications   acetaminophen 500 MG tablet Commonly known as: TYLENOL Take 500-1,000 mg by mouth every 6 (six) hours as needed for mild pain.   Alpha-Lipoic Acid 300 MG Caps Take 300 mg by mouth daily.   amoxicillin-clavulanate 875-125 MG tablet Commonly known as: AUGMENTIN Take 1 tablet by mouth every 12 (twelve) hours for 2 days.   apixaban 5 MG Tabs tablet Commonly known as: Eliquis Take 1 tablet (5 mg total) by mouth 2 (two) times daily.   atorvastatin 20 MG tablet Commonly known as: LIPITOR TAKE 1 TABLET BY MOUTH DAILY   CENTRUM SILVER PO Take 1 tablet by mouth daily.   CoQ-10 100 MG Caps Take 100 mg by mouth daily.   digoxin 0.25 MG tablet Commonly known as: LANOXIN Take 1 tablet (0.25 mg total) by mouth daily.   mycophenolate 250 MG capsule Commonly known as: CellCept Take 1 capsule (250 mg total) by mouth 2 (two) times daily.   OMEGA-3 PO Take 1,040 mg by mouth daily.   pyridostigmine 60 MG tablet Commonly known as: MESTINON Take 1 tablet (60 mg total) by mouth 3 (three) times daily as  needed. What changed: when to take this   ramipril 5 MG capsule Commonly known as: ALTACE Take 5 mg by mouth daily.   Resveratrol 100 MG Caps Take 100 mg by mouth 2 (two) times daily.   saccharomyces boulardii 250 MG capsule Commonly known as: FLORASTOR Take 1 capsule (250 mg total) by mouth 2 (two) times daily for 14 days.   tamsulosin 0.4 MG Caps capsule Commonly known as: FLOMAX Take 0.4 mg by mouth daily.   traMADol 50 MG tablet Commonly known as: ULTRAM Take 1 tablet (50 mg total) by mouth every 6 (six) hours as needed for up to 15 doses for severe pain.   triamterene-hydrochlorothiazide 37.5-25 MG tablet Commonly known as: MAXZIDE-25 Take 1.5 tablets by mouth daily.   TURMERIC PO Take 100 mg by mouth 2 (two) times daily.       Follow-up Information    Dr. Luretha Murphy. Go on 10/07/2019.   Why: Your appointment is 8/25 at 11am Please arrive 15  minutes early to check in. Contact information: [employer] [facility] at [facility] [address] [place], [place]   Telephone #: [phone]             Allergies  Allergen Reactions  . Demerol [Meperidine]     hallucinations  . Aminoglycosides     Can not use due to mysthenia gravis  . Beta Adrenergic Blockers     Can not use due to mysthenia gravis  . Calcium Channel Blockers     Can not use due to mysthenia gravis  . Ciprofloxacin     Can not use due to mysthenia gravis  . Iodinated Diagnostic Agents     Can not use due to mysthenia gravis  . Penicillamine     Can not use due to mysthenia gravis  . Procainamide Hcl     Can not use due to mysthenia gravis  . Quinine Derivatives     Can not use due to mysthenia gravis  . Succinylcholine Chloride     Can not use due to mysthenia gravis  . Vecuronium Bromide [Vecuronium]     Can not use due to mysthenia gravis    You were cared for by a hospitalist during your hospital stay. If you have any questions about your  discharge medications or the care you received while you were in the hospital after you are discharged, you can call the unit and asked to speak with the hospitalist on call if the hospitalist that took care of you is not available. Once you are discharged, your primary care physician will handle any further medical issues. Please note that no refills for any discharge medications will be authorized once you are discharged, as it is imperative that you return to your primary care physician (or establish a relationship with a primary care physician if you do not have one) for your aftercare needs so that they can reassess your need for medications and monitor your lab values.   Procedures/Studies: CT ABDOMEN PELVIS WO CONTRAST  Result Date: 09/16/2019 CLINICAL DATA:  Leukocytosis status post appendectomy. EXAM: CT ABDOMEN AND PELVIS WITHOUT CONTRAST TECHNIQUE: Multidetector CT imaging of the abdomen and pelvis was performed following the standard protocol without IV contrast. COMPARISON:  September 08, 2019. FINDINGS: Lower chest: No acute abnormality. Hepatobiliary: No focal liver abnormality is seen. No gallstones, gallbladder wall thickening, or biliary dilatation. Pancreas: Unremarkable. No pancreatic ductal dilatation or surrounding inflammatory changes. Spleen: Normal in size without focal abnormality. Adrenals/Urinary Tract: Adrenal glands appear normal. Stable left renal cyst is noted. Small nonobstructive left renal calculus is noted. No hydronephrosis or renal obstruction is noted. Urinary bladder is unremarkable. Stomach/Bowel: Status post appendectomy. The stomach is unremarkable. There is no evidence of bowel obstruction or inflammation. Surgical drain is noted entering right lower quadrant with distal tip in epigastric region. Vascular/Lymphatic: Aortic atherosclerosis. No enlarged abdominal or pelvic lymph nodes. Reproductive: Stable moderate prostatic enlargement is noted. Other: No abnormal fluid  collection is noted. No definite hernia is noted. Musculoskeletal: Multilevel degenerative disc disease is noted in the lumbar spine. No acute abnormality is noted. IMPRESSION: 1. Status post appendectomy. Surgical drain is noted entering right lower quadrant with distal tip in epigastric region. No abnormal fluid collection is noted. 2. Small nonobstructive left renal calculus. No hydronephrosis or renal obstruction is noted. 3. Stable moderate prostatic enlargement. Aortic Atherosclerosis (ICD10-I70.0). Electronically Signed   By: Lupita Raider M.D.   On: 09/16/2019 14:50   CT ABDOMEN  PELVIS WO CONTRAST  Result Date: 09/08/2019 CLINICAL DATA:  Fever and abdominal distension, history of recent prostate procedure EXAM: CT ABDOMEN AND PELVIS WITHOUT CONTRAST TECHNIQUE: Multidetector CT imaging of the abdomen and pelvis was performed following the standard protocol without IV contrast. COMPARISON:  05/17/2019 FINDINGS: Lower chest: Lung bases are clear. Previously seen nodular density in the right lower lobe represent some focal eventration of the diaphragm. No other focal abnormality is noted. Hepatobiliary: No focal liver abnormality is seen. No gallstones, gallbladder wall thickening, or biliary dilatation. Pancreas: Unremarkable. No pancreatic ductal dilatation or surrounding inflammatory changes. Spleen: Normal in size without focal abnormality. Adrenals/Urinary Tract: Adrenal glands demonstrate nodularity stable from the prior exam. No renal calculi are seen. Hypodensity in the left mid kidney is noted consistent with cyst. The bladder is decompressed by Foley catheter. Stomach/Bowel: Non rotation of the bowel is noted with the cecum in the left lower quadrant and the majority of the small bowel on the right. The appendix is visualized but small. Mild dilatation is seen with mild inflammatory changes suspicious for acute appendicitis. Correlate with laboratory values. No other focal bowel abnormality is  noted. Vascular/Lymphatic: Aortic atherosclerosis. No enlarged abdominal or pelvic lymph nodes. Reproductive: Prostate is enlarged. Other: No abdominal wall hernia or abnormality. No abdominopelvic ascites. Musculoskeletal: Degenerative changes of lumbar spine are noted. No acute abnormality is seen. IMPRESSION: Changes suspicious for acute appendicitis in the left lower quadrant due to non rotation of the bowel. These changes have progressed in the interval from the prior exam. The appendix measures approximately 14 mm in greatest dimension. No abscess is seen. Chronic changes stable from the previous exam. Electronically Signed   By: Alcide CleverMark  Lukens M.D.   On: 09/08/2019 21:58   DG Chest Port 1 View  Result Date: 09/11/2019 CLINICAL DATA:  Status post PICC line placement. EXAM: PORTABLE CHEST 1 VIEW COMPARISON:  September 09, 2019. FINDINGS: The heart size and mediastinal contours are within normal limits. Both lungs are clear. Interval placement of right-sided PICC line with distal tip in expected position of the SVC. The visualized skeletal structures are unremarkable. IMPRESSION: Interval placement of right-sided PICC line with distal tip in expected position of the SVC. Electronically Signed   By: Lupita RaiderJames  Green Jr M.D.   On: 09/11/2019 09:54   DG CHEST PORT 1 VIEW  Result Date: 09/09/2019 CLINICAL DATA:  Increased shortness of breath this morning EXAM: PORTABLE CHEST 1 VIEW COMPARISON:  09/20/2016 FINDINGS: No cardiomegaly. Borderline vascular congestion. No edema, consolidation, effusion, or pneumothorax. IMPRESSION: Borderline vascular congestion. Electronically Signed   By: Marnee SpringJonathon  Watts M.D.   On: 09/09/2019 07:27   DG Abd Portable 2V  Result Date: 09/11/2019 CLINICAL DATA:  Postop abdominal pain. Laparoscopic appendectomy yesterday. EXAM: PORTABLE ABDOMEN - 2 VIEW COMPARISON:  Abdominal CT 09/08/2019 FINDINGS: The lung bases are clear. Mild gaseous distension of small and large bowel in a  nonobstructive pattern. Colon appears in the left abdomen with small bowel in the right consistent with history of bowel malrotation. There scattered skin staples. No large volume free intra-abdominal air. Stable osseous structures. IMPRESSION: Mild gaseous distension of small and large bowel in a nonobstructive pattern, likely postoperative ileus. Electronically Signed   By: Narda RutherfordMelanie  Sanford M.D.   On: 09/11/2019 14:54   US EKG SITE RITE  Result Date: 09/11/2019 If Site Rite image not attached, placement could not be confirmed due to current cardiac rhythm.     The results of significant diagnostics from this hospitalization (  including imaging, microbiology, ancillary and laboratory) are listed below for reference.     Microbiology: Recent Results (from the past 240 hour(s))  Urine C&S     Status: Abnormal   Collection Time: 09/08/19  8:22 PM   Specimen: Urine, Catheterized  Result Value Ref Range Status   Specimen Description   Final    URINE, CATHETERIZED Performed at [facility], [address], [place], [place] [address]    Special Requests NONE  Final   Culture (A)  Final    <10,000 COLONIES/mL INSIGNIFICANT GROWTH Performed at [facility], [address] [place], [place] [address]    Report Status 09/10/2019 FINAL  Final  SARS Coronavirus 2 by RT PCR (hospital order, performed in [facility] hospital lab) Nasopharyngeal Nasopharyngeal Swab     Status: None   Collection Time: 09/08/19 11:30 PM   Specimen: Nasopharyngeal Swab  Result Value Ref Range Status   SARS Coronavirus 2 NEGATIVE NEGATIVE Final    Comment: (NOTE) SARS-CoV-2 target nucleic acids are NOT DETECTED.  The SARS-CoV-2 RNA is generally detectable in upper and lower respiratory specimens during the acute phase of infection. The lowest concentration of SARS-CoV-2 viral copies this assay can detect is 250 copies / mL. A negative result does not preclude SARS-CoV-2 infection and  should not be used as the sole basis for treatment or other patient management decisions.  A negative result may occur with improper specimen collection / handling, submission of specimen other than nasopharyngeal swab, presence of viral mutation(s) within the areas targeted by this assay, and inadequate number of viral copies (<250 copies / mL). A negative result must be combined with clinical observations, patient history, and epidemiological information.  Fact Sheet for Patients:   BoilerBrush.com.cy  Fact Sheet for Healthcare Providers: https://pope.com/  This test is not yet approved or  cleared by the [place] FDA and has been authorized for detection and/or diagnosis of SARS-CoV-2 by FDA under an Emergency Use Authorization (EUA).  This EUA will remain in effect (meaning this test can be used) for the duration of the COVID-19 declaration under Section 564(b)(1) of the Act, 21 U.S.C. section 360bbb-3(b)(1), unless the authorization is terminated or revoked sooner.  Performed at [facility], [address], [place], [place] [address]   Surgical PCR screen     Status: None   Collection Time: 09/10/19  4:32 AM   Specimen: Nasal Mucosa; Nasal Swab  Result Value Ref Range Status   MRSA, PCR NEGATIVE NEGATIVE Final   Staphylococcus aureus NEGATIVE NEGATIVE Final    Comment: (NOTE) The Xpert SA Assay (FDA approved for NASAL specimens in patients 77 years of age and older), is one component of a comprehensive surveillance program. It is not intended to diagnose infection nor to guide or monitor treatment. Performed at [facility], [address], [place], [place] [address]      Labs: BNP (last 3 results) Recent Labs    09/09/19 1144  BNP 114.1*   Basic Metabolic Panel: Recent Labs  Lab 09/12/19 0424 09/13/19 0345 09/14/19 0350 09/16/19 0300 09/17/19 0349  NA 139 138 138  137 134*  K 3.2* 3.3* 4.2 3.6 3.8  CL 100 102 101 102 102  CO2 27 27 28 27 23   GLUCOSE 131* 137* 130* 100* 88  BUN 23 20 19 19 22   CREATININE 0.65 0.48* 0.66 0.66 0.67  CALCIUM 8.5* 8.3* 8.6* 8.8* 8.5*  MG 2.0 2.0 2.1  --   --  PHOS 2.3* 3.7 3.5  --   --    Liver Function Tests: Recent Labs  Lab 09/12/19 0424 09/14/19 0350  AST 21 42*  ALT 18 38  ALKPHOS 44 53  BILITOT 0.6 0.7  PROT 5.6* 6.2*  ALBUMIN 2.7* 3.0*   No results for input(s): LIPASE, AMYLASE in the last 168 hours. No results for input(s): AMMONIA in the last 168 hours. CBC: Recent Labs  Lab 09/12/19 0424 09/13/19 0345 09/14/19 0350 09/16/19 0300 09/17/19 0349  WBC 7.9 8.4 11.0* 12.7* 11.4*  NEUTROABS 6.0  --  8.1*  --   --   HGB 13.6 14.3 15.2 15.1 14.9  HCT 41.6 44.2 45.9 45.8 45.7  MCV 98.1 97.6 97.9 97.4 98.3  PLT 165 179 189 222 243   Cardiac Enzymes: No results for input(s): CKTOTAL, CKMB, CKMBINDEX, TROPONINI in the last 168 hours. BNP: Invalid input(s): POCBNP CBG: Recent Labs  Lab 09/15/19 1656 09/16/19 0643 09/16/19 1107 09/16/19 1703 09/17/19 0739  GLUCAP 105* 99 129* 105* 91   D-Dimer No results for input(s): DDIMER in the last 72 hours. Hgb A1c No results for input(s): HGBA1C in the last 72 hours. Lipid Profile No results for input(s): CHOL, HDL, LDLCALC, TRIG, CHOLHDL, LDLDIRECT in the last 72 hours. Thyroid function studies No results for input(s): TSH, T4TOTAL, T3FREE, THYROIDAB in the last 72 hours.  Invalid input(s): FREET3 Anemia work up No results for input(s): VITAMINB12, FOLATE, FERRITIN, TIBC, IRON, RETICCTPCT in the last 72 hours. Urinalysis    Component Value Date/Time   COLORURINE YELLOW 09/08/2019 2022   APPEARANCEUR HAZY (A) 09/08/2019 2022   LABSPEC 1.025 09/08/2019 2022   PHURINE 6.0 09/08/2019 2022   GLUCOSEU NEGATIVE 09/08/2019 2022   HGBUR LARGE (A) 09/08/2019 2022   BILIRUBINUR SMALL (A) 09/08/2019 2022   KETONESUR 40 (A) 09/08/2019 2022    PROTEINUR >300 (A) 09/08/2019 2022   UROBILINOGEN 0.2 07/05/2007 2323   NITRITE NEGATIVE 09/08/2019 2022   LEUKOCYTESUR TRACE (A) 09/08/2019 2022   Sepsis Labs Invalid input(s): PROCALCITONIN,  WBC,  LACTICIDVEN Microbiology Recent Results (from the past 240 hour(s))  Urine C&S     Status: Abnormal   Collection Time: 09/08/19  8:22 PM   Specimen: Urine, Catheterized  Result Value Ref Range Status   Specimen Description   Final    URINE, CATHETERIZED Performed at [facility], [address] [place], [place] [address]    Special Requests NONE  Final   Culture (A)  Final    <10,000 COLONIES/mL INSIGNIFICANT GROWTH Performed at [facility], [address], [place], [place] [address]    Report Status 09/10/2019 FINAL  Final  SARS Coronavirus 2 by RT PCR (hospital order, performed in [facility] hospital lab) Nasopharyngeal Nasopharyngeal Swab     Status: None   Collection Time: 09/08/19 11:30 PM   Specimen: Nasopharyngeal Swab  Result Value Ref Range Status   SARS Coronavirus 2 NEGATIVE NEGATIVE Final    Comment: (NOTE) SARS-CoV-2 target nucleic acids are NOT DETECTED.  The SARS-CoV-2 RNA is generally detectable in upper and lower respiratory specimens during the acute phase of infection. The lowest concentration of SARS-CoV-2 viral copies this assay can detect is 250 copies / mL. A negative result does not preclude SARS-CoV-2 infection and should not be used as the sole basis for treatment or other patient management decisions.  A negative result may occur with improper specimen collection / handling, submission of specimen other than nasopharyngeal swab, presence of viral  mutation(s) within the areas targeted by this assay, and inadequate number of viral copies (<250 copies / mL). A negative result must be combined with clinical observations, patient history, and epidemiological information.  Fact Sheet for Patients:    BoilerBrush.com.cy  Fact Sheet for Healthcare Providers: https://pope.com/  This test is not yet approved or  cleared by the [place] FDA and has been authorized for detection and/or diagnosis of SARS-CoV-2 by FDA under an Emergency Use Authorization (EUA).  This EUA will remain in effect (meaning this test can be used) for the duration of the COVID-19 declaration under Section 564(b)(1) of the Act, 21 U.S.C. section 360bbb-3(b)(1), unless the authorization is terminated or revoked sooner.  Performed at [facility], [address], [place], [place] [address]   Surgical PCR screen     Status: None   Collection Time: 09/10/19  4:32 AM   Specimen: Nasal Mucosa; Nasal Swab  Result Value Ref Range Status   MRSA, PCR NEGATIVE NEGATIVE Final   Staphylococcus aureus NEGATIVE NEGATIVE Final    Comment: (NOTE) The Xpert SA Assay (FDA approved for NASAL specimens in patients 29 years of age and older), is one component of a comprehensive surveillance program. It is not intended to diagnose infection nor to guide or monitor treatment. Performed at [facility], [address], [place], [place] [address]      Time coordinating discharge:  I have spent 35 minutes face to face with the patient and on the ward discussing the patients care, assessment, plan and disposition with other care givers. >50% of the time was devoted counseling the patient about the risks and benefits of treatment/Discharge disposition and coordinating care.   SIGNED:   Dimple Nanas, MD  [facility] 09/17/2019, 11:01 AM   If 7PM-7AM, please contact night-coverage

## 2019-09-17 NOTE — Progress Notes (Signed)
Approximally 6 Spot frank blood drops noted from urethra throughout night 0600 heparin held awaiting results from cbc

## 2019-09-17 NOTE — Progress Notes (Signed)
Patient is being discharged home today. Discharge instructions including medications and follow up appointments given. Pt had no further questions at this time. 

## 2019-09-17 NOTE — Progress Notes (Addendum)
7 Days Post-Op    CC: Abdominal pain  Subjective: He is doing well this AM.  I took out his drain and his staples.  We had a long talk about care at home.  All his sites look good.  He has not had a solid BM so far.  Objective: Vital signs in last 24 hours: Temp:  [97.7 F (36.5 C)-98.1 F (36.7 C)] 97.8 F (36.6 C) (08/05 0418) Pulse Rate:  [66-91] 66 (08/05 0418) Resp:  [18] 18 (08/05 0418) BP: (108-121)/(73-78) 121/78 (08/05 0418) SpO2:  [96 %-98 %] 98 % (08/05 0418) Last BM Date: 09/16/19 1380 p.o. recorded For 40 IV recorded 950 urine Drain 5 cc Stool x1 Afebrile vital signs are stable. Sodium is 134, potassium is 3.8, creatinine 0.67 WBC 11.4 H/H 14.9/45.7, platelets 243,000. CT of the abdomen yesterday for postop leukocytosis: S/p post appendectomy, surgical drain is noted entering the right lower quadrant with distal tip in the epigastric region.  No abnormal fluid collection noted.  Small nonobstructive left renal calculus.  No hydronephrosis or renal obstruction noted.  Stable moderate pack prostatic enlargement. Intake/Output from previous day: 08/04 0701 - 08/05 0700 In: 1820 [P.O.:1380; I.V.:440] Out: 955 [Urine:950; Drains:5] Intake/Output this shift: Total I/O In: 236 [P.O.:236] Out: 200 [Urine:200]  General appearance: alert, cooperative and no distress Resp: clear to auscultation bilaterally GI: soft, sore, sites all look fine, drain is clear and minimal drainage.  Lab Results:  Recent Labs    09/16/19 0300 09/17/19 0349  WBC 12.7* 11.4*  HGB 15.1 14.9  HCT 45.8 45.7  PLT 222 243    BMET Recent Labs    09/16/19 0300 09/17/19 0349  NA 137 134*  K 3.6 3.8  CL 102 102  CO2 27 23  GLUCOSE 100* 88  BUN 19 22  CREATININE 0.66 0.67  CALCIUM 8.8* 8.5*   PT/INR No results for input(s): LABPROT, INR in the last 72 hours.  Recent Labs  Lab 09/12/19 0424 09/14/19 0350  AST 21 42*  ALT 18 38  ALKPHOS 44 53  BILITOT 0.6 0.7  PROT 5.6*  6.2*  ALBUMIN 2.7* 3.0*     Lipase     Component Value Date/Time   LIPASE 26 09/08/2019 2224     Medications: . amoxicillin-clavulanate  1 tablet Oral Q12H  . atorvastatin  20 mg Oral Daily  . Chlorhexidine Gluconate Cloth  6 each Topical Daily  . digoxin  0.25 mg Oral Daily  . heparin  5,000 Units Subcutaneous Q8H  . insulin aspart  0-9 Units Subcutaneous TID WC  . potassium chloride  20 mEq Oral Once  . pyridostigmine  60 mg Oral TID  . sodium chloride flush  10-40 mL Intracatheter Q12H  . tamsulosin  0.4 mg Oral Daily  . triamterene-hydrochlorothiazide  1.5 tablet Oral Daily     Assessment/Plan Sepsis secondary to acute appendicitis Myasthenia gravis- Mestion, CellCept Chronic AF - Digoxin, Eliquis Hypertension 6 BPH BMI 30.85 Thrombocytopenia- resolved ModerateProtein calorie malnutrition -7.9>>15.3(8/2)  Acute gangrenous appendicitis with perforation at the base of the appendix, purulent peritonitis - S/pLaparoscopic appendectomy with closure of colon perforation(5 mm trocar injury and avulsion of the appendix from the cecum,)Blake drain placement, 09/10/2019, Dr. Luretha Murphy -POD #7 - post op ileus-improving - continue JP drain for now and monitor output, may be able to come out prior to discharge it if remain serous and low volume  -WBC 15.7>> 11.7>> 9.0>> 7.9>> 8.4>> 11.0>> 12.7>> 11.4(09/17/19) FEN: soft diet ID: Zosyn 7/28>>8/3,  augmentin 8/3>> day 3 DVT: Heparin SQ Follow-up: Dr. Daphine Deutscher  Plan: I will check with Dr. Carolynne Edouard about further antibiotics, but otherwise he can go from our standpoint.  I removed staples and drain today so he does not need to follow up with that.  I have added a probiotic.  He ask about his constipation issues and I will put in some info on good bowel health.  He can also discuss with with Dr. Matthias Hughs.  I will get back to you on the antibiotics as soon as I can talk with Dr. Carolynne Edouard.   Please send a DC summary to Dr. Daphine Deutscher.         LOS: 9 days    Cloy Cozzens 09/17/2019 Please see Amion

## 2019-09-23 DIAGNOSIS — G7000 Myasthenia gravis without (acute) exacerbation: Secondary | ICD-10-CM | POA: Diagnosis not present

## 2019-09-23 DIAGNOSIS — K3532 Acute appendicitis with perforation and localized peritonitis, without abscess: Secondary | ICD-10-CM | POA: Diagnosis not present

## 2019-09-23 DIAGNOSIS — I10 Essential (primary) hypertension: Secondary | ICD-10-CM | POA: Diagnosis not present

## 2019-09-23 DIAGNOSIS — I4819 Other persistent atrial fibrillation: Secondary | ICD-10-CM | POA: Diagnosis not present

## 2019-09-23 DIAGNOSIS — Z09 Encounter for follow-up examination after completed treatment for conditions other than malignant neoplasm: Secondary | ICD-10-CM | POA: Diagnosis not present

## 2019-09-23 DIAGNOSIS — E782 Mixed hyperlipidemia: Secondary | ICD-10-CM | POA: Diagnosis not present

## 2019-10-20 DIAGNOSIS — R7989 Other specified abnormal findings of blood chemistry: Secondary | ICD-10-CM | POA: Diagnosis not present

## 2019-11-03 DIAGNOSIS — G7000 Myasthenia gravis without (acute) exacerbation: Secondary | ICD-10-CM | POA: Diagnosis not present

## 2019-11-03 DIAGNOSIS — I2584 Coronary atherosclerosis due to calcified coronary lesion: Secondary | ICD-10-CM | POA: Diagnosis not present

## 2019-11-03 DIAGNOSIS — I4891 Unspecified atrial fibrillation: Secondary | ICD-10-CM | POA: Diagnosis not present

## 2019-11-03 DIAGNOSIS — I10 Essential (primary) hypertension: Secondary | ICD-10-CM | POA: Diagnosis not present

## 2019-11-03 DIAGNOSIS — D696 Thrombocytopenia, unspecified: Secondary | ICD-10-CM | POA: Diagnosis not present

## 2019-11-03 DIAGNOSIS — E782 Mixed hyperlipidemia: Secondary | ICD-10-CM | POA: Diagnosis not present

## 2019-11-10 ENCOUNTER — Ambulatory Visit: Payer: Medicare PPO | Admitting: Neurology

## 2019-11-18 ENCOUNTER — Encounter (INDEPENDENT_AMBULATORY_CARE_PROVIDER_SITE_OTHER): Payer: Medicare PPO | Admitting: Ophthalmology

## 2019-11-18 ENCOUNTER — Other Ambulatory Visit: Payer: Self-pay

## 2019-11-18 DIAGNOSIS — H353112 Nonexudative age-related macular degeneration, right eye, intermediate dry stage: Secondary | ICD-10-CM

## 2019-11-18 DIAGNOSIS — H353221 Exudative age-related macular degeneration, left eye, with active choroidal neovascularization: Secondary | ICD-10-CM | POA: Diagnosis not present

## 2019-11-18 DIAGNOSIS — H43813 Vitreous degeneration, bilateral: Secondary | ICD-10-CM

## 2019-11-18 DIAGNOSIS — H35033 Hypertensive retinopathy, bilateral: Secondary | ICD-10-CM | POA: Diagnosis not present

## 2019-11-18 DIAGNOSIS — I10 Essential (primary) hypertension: Secondary | ICD-10-CM

## 2019-12-01 ENCOUNTER — Encounter: Payer: Self-pay | Admitting: Neurology

## 2019-12-01 ENCOUNTER — Ambulatory Visit: Payer: Medicare PPO | Admitting: Neurology

## 2019-12-01 ENCOUNTER — Other Ambulatory Visit: Payer: Self-pay

## 2019-12-01 VITALS — BP 133/78 | HR 62 | Ht 67.0 in | Wt 190.5 lb

## 2019-12-01 DIAGNOSIS — G7000 Myasthenia gravis without (acute) exacerbation: Secondary | ICD-10-CM | POA: Diagnosis not present

## 2019-12-01 DIAGNOSIS — I482 Chronic atrial fibrillation, unspecified: Secondary | ICD-10-CM

## 2019-12-01 MED ORDER — MYCOPHENOLATE MOFETIL 250 MG PO CAPS: 250.0000 mg | ORAL_CAPSULE | Freq: Two times a day (BID) | ORAL | 4 refills | Status: DC
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
## 2019-12-08 ENCOUNTER — Telehealth: Payer: Self-pay | Admitting: Interventional Cardiology

## 2019-12-08 DIAGNOSIS — I712 Thoracic aortic aneurysm, without rupture, unspecified: Secondary | ICD-10-CM

## 2019-12-08 DIAGNOSIS — I7121 Aneurysm of the ascending aorta, without rupture: Secondary | ICD-10-CM

## 2019-12-08 DIAGNOSIS — I77819 Aortic ectasia, unspecified site: Secondary | ICD-10-CM

## 2019-12-08 NOTE — Telephone Encounter (Signed)
Alisha from [facility] needs clarification on orders for this patient's CT. Please call her direct number at [phone]

## 2019-12-08 NOTE — Telephone Encounter (Signed)
Per Dr. Katrinka Blazing last office visit on 09/03/19 "Aortic diameter is 4.2 cm based upon echocardiography done in 2020.  This will need to be reassessed prior to the next office visit possibly by CT angiography."   Placed order and called [facility] to let them know order will be changed to CT angiography. Will forward to Dr. Michaelle Copas nurse to follow up.

## 2020-02-17 ENCOUNTER — Other Ambulatory Visit: Payer: Self-pay | Admitting: *Deleted

## 2020-02-17 MED ORDER — APIXABAN 5 MG PO TABS: 5.0000 mg | ORAL_TABLET | Freq: Two times a day (BID) | ORAL | 5 refills | Status: DC

## 2020-02-17 MED ORDER — APIXABAN 5 MG PO TABS: ORAL_TABLET | ORAL | 5 refills | Status: DC

## 2020-02-17 NOTE — Addendum Note (Signed)
Addended by: Mellody Dance B on: 02/17/2020 09:03 AM   Modules accepted: Orders

## 2020-02-17 NOTE — Telephone Encounter (Signed)
Prescription refill request for Eliquis received from [facility].  Indication: Afib Last office visit: Katrinka Blazing 09/03/2019 Scr: 0.67, 09/17/2019 Age: 75 yo Weight: 86.4  Prescription refill sent.

## 2020-03-01 ENCOUNTER — Other Ambulatory Visit: Payer: Self-pay | Admitting: Interventional Cardiology

## 2020-03-02 ENCOUNTER — Other Ambulatory Visit: Payer: Self-pay

## 2020-03-02 ENCOUNTER — Encounter (INDEPENDENT_AMBULATORY_CARE_PROVIDER_SITE_OTHER): Payer: Medicare PPO | Admitting: Ophthalmology

## 2020-03-02 DIAGNOSIS — H353112 Nonexudative age-related macular degeneration, right eye, intermediate dry stage: Secondary | ICD-10-CM

## 2020-03-02 DIAGNOSIS — H35033 Hypertensive retinopathy, bilateral: Secondary | ICD-10-CM

## 2020-03-02 DIAGNOSIS — H43813 Vitreous degeneration, bilateral: Secondary | ICD-10-CM

## 2020-03-02 DIAGNOSIS — I10 Essential (primary) hypertension: Secondary | ICD-10-CM

## 2020-03-02 DIAGNOSIS — H353221 Exudative age-related macular degeneration, left eye, with active choroidal neovascularization: Secondary | ICD-10-CM

## 2020-03-17 ENCOUNTER — Encounter (INDEPENDENT_AMBULATORY_CARE_PROVIDER_SITE_OTHER): Payer: Medicare PPO | Admitting: Ophthalmology

## 2020-03-22 ENCOUNTER — Encounter (INDEPENDENT_AMBULATORY_CARE_PROVIDER_SITE_OTHER): Payer: Medicare PPO | Admitting: Ophthalmology

## 2020-03-23 ENCOUNTER — Encounter (INDEPENDENT_AMBULATORY_CARE_PROVIDER_SITE_OTHER): Payer: Medicare PPO | Admitting: Ophthalmology

## 2020-03-23 ENCOUNTER — Other Ambulatory Visit: Payer: Self-pay

## 2020-03-23 DIAGNOSIS — H353221 Exudative age-related macular degeneration, left eye, with active choroidal neovascularization: Secondary | ICD-10-CM

## 2020-04-12 ENCOUNTER — Other Ambulatory Visit: Payer: Self-pay | Admitting: *Deleted

## 2020-04-12 DIAGNOSIS — I712 Thoracic aortic aneurysm, without rupture, unspecified: Secondary | ICD-10-CM

## 2020-04-12 DIAGNOSIS — I77819 Aortic ectasia, unspecified site: Secondary | ICD-10-CM

## 2020-04-12 DIAGNOSIS — I7121 Aneurysm of the ascending aorta, without rupture: Secondary | ICD-10-CM

## 2020-05-11 DIAGNOSIS — Z125 Encounter for screening for malignant neoplasm of prostate: Secondary | ICD-10-CM | POA: Diagnosis not present

## 2020-05-11 DIAGNOSIS — G7000 Myasthenia gravis without (acute) exacerbation: Secondary | ICD-10-CM | POA: Diagnosis not present

## 2020-05-11 DIAGNOSIS — I2510 Atherosclerotic heart disease of native coronary artery without angina pectoris: Secondary | ICD-10-CM | POA: Diagnosis not present

## 2020-05-11 DIAGNOSIS — I700 Atherosclerosis of aorta: Secondary | ICD-10-CM | POA: Diagnosis not present

## 2020-05-11 DIAGNOSIS — Z1389 Encounter for screening for other disorder: Secondary | ICD-10-CM | POA: Diagnosis not present

## 2020-05-11 DIAGNOSIS — E782 Mixed hyperlipidemia: Secondary | ICD-10-CM | POA: Diagnosis not present

## 2020-05-11 DIAGNOSIS — I4819 Other persistent atrial fibrillation: Secondary | ICD-10-CM | POA: Diagnosis not present

## 2020-05-11 DIAGNOSIS — I10 Essential (primary) hypertension: Secondary | ICD-10-CM | POA: Diagnosis not present

## 2020-05-11 DIAGNOSIS — Z1159 Encounter for screening for other viral diseases: Secondary | ICD-10-CM | POA: Diagnosis not present

## 2020-05-11 DIAGNOSIS — Z0000 Encounter for general adult medical examination without abnormal findings: Secondary | ICD-10-CM | POA: Diagnosis not present

## 2020-05-24 DIAGNOSIS — R1031 Right lower quadrant pain: Secondary | ICD-10-CM | POA: Diagnosis not present

## 2020-05-24 DIAGNOSIS — R198 Other specified symptoms and signs involving the digestive system and abdomen: Secondary | ICD-10-CM | POA: Diagnosis not present

## 2020-05-24 DIAGNOSIS — Z86010 Personal history of colonic polyps: Secondary | ICD-10-CM | POA: Diagnosis not present
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
## 2020-05-31 ENCOUNTER — Encounter: Payer: Self-pay | Admitting: Neurology

## 2020-05-31 ENCOUNTER — Ambulatory Visit: Payer: Medicare PPO | Admitting: Neurology

## 2020-05-31 VITALS — BP 123/77 | HR 74 | Ht 67.0 in | Wt 189.0 lb

## 2020-05-31 DIAGNOSIS — G7000 Myasthenia gravis without (acute) exacerbation: Secondary | ICD-10-CM | POA: Diagnosis not present

## 2020-05-31 MED ORDER — PYRIDOSTIGMINE BROMIDE 60 MG PO TABS: 60.0000 mg | ORAL_TABLET | Freq: Three times a day (TID) | ORAL | 11 refills | Status: DC | PRN

## 2020-05-31 NOTE — Patient Instructions (Signed)
Check Vitamin D level  For now continue Cellcept and Mestinon at current dosing See you back in 6 months

## 2020-06-01 LAB — VITAMIN D 25 HYDROXY (VIT D DEFICIENCY, FRACTURES): Vit D, 25-Hydroxy: 32.3 ng/mL (ref 30.0–100.0)

## 2020-06-02 ENCOUNTER — Telehealth: Payer: Self-pay

## 2020-06-02 NOTE — Telephone Encounter (Signed)
-----   Message from Glean Salvo, NP sent at 06/01/2020  5:57 AM EDT ----- Sent my chart message: Mr. Schar,  Vitamin D level is within normal range at 32.3. Take Care,  Maralyn Sago

## 2020-07-06 ENCOUNTER — Encounter (INDEPENDENT_AMBULATORY_CARE_PROVIDER_SITE_OTHER): Payer: Medicare PPO | Admitting: Ophthalmology

## 2020-07-06 ENCOUNTER — Other Ambulatory Visit: Payer: Self-pay

## 2020-07-06 DIAGNOSIS — H353221 Exudative age-related macular degeneration, left eye, with active choroidal neovascularization: Secondary | ICD-10-CM | POA: Diagnosis not present

## 2020-07-06 DIAGNOSIS — I10 Essential (primary) hypertension: Secondary | ICD-10-CM

## 2020-07-06 DIAGNOSIS — H35033 Hypertensive retinopathy, bilateral: Secondary | ICD-10-CM

## 2020-07-06 DIAGNOSIS — H353112 Nonexudative age-related macular degeneration, right eye, intermediate dry stage: Secondary | ICD-10-CM | POA: Diagnosis not present

## 2020-07-06 DIAGNOSIS — H43813 Vitreous degeneration, bilateral: Secondary | ICD-10-CM

## 2020-08-10 ENCOUNTER — Other Ambulatory Visit: Payer: Self-pay | Admitting: *Deleted

## 2020-08-10 MED ORDER — PREDNISONE 50 MG PO TABS
ORAL_TABLET | ORAL | 0 refills | Status: DC
Start: 2020-08-10 — End: 2020-08-31

## 2020-08-10 NOTE — Progress Notes (Signed)
Spoke with pt and discussed Contrast dye allergy medication protocol.  He is aware of times to take Prednisone and knows to take a Benadryl with his last prednisone dose.  Pt appreciative for call.

## 2020-08-11 ENCOUNTER — Telehealth: Payer: Self-pay | Admitting: Neurology

## 2020-08-11 NOTE — Telephone Encounter (Signed)
Shouldn't be any problem to take prednisone along with Cellcept and Mestinon before CT scan.

## 2020-08-11 NOTE — Telephone Encounter (Signed)
Pt called, cardiologist ordered a CT scan. He prescribed Prednisone to take prior to scan. Will that medication interfere with Mycophenolate or Pyridostigmine. Would like a call from the nurse.

## 2020-08-11 NOTE — Telephone Encounter (Signed)
Relayed message to pt and he verbalized understanding.  He appreciated call back.

## 2020-08-16 ENCOUNTER — Other Ambulatory Visit: Payer: Self-pay | Admitting: *Deleted

## 2020-08-16 DIAGNOSIS — I482 Chronic atrial fibrillation, unspecified: Secondary | ICD-10-CM

## 2020-08-16 MED ORDER — APIXABAN 5 MG PO TABS: ORAL_TABLET | ORAL | 5 refills | Status: DC

## 2020-08-16 NOTE — Telephone Encounter (Signed)
Eliquis 5mg  paper refill request received. Patient is 75 years old, weight-85.7kg, Crea-0.67 on 09/17/2019, Diagnosis-Afib, and last seen by Dr. 11/17/2019 on 09/03/2019 & pending appt on 08/31/20. Dose is appropriate based on dosing criteria. Will send in refill to requested pharmacy.

## 2020-08-16 NOTE — Telephone Encounter (Signed)
I called the patient, he has no further questions. Will take prednisone, benadryl before CT with contrast, will observe for any worsening of MG. Is overall stable. Drink plenty of water before and after.

## 2020-08-16 NOTE — Telephone Encounter (Signed)
Pt returned call, would like a call back.  

## 2020-08-22 ENCOUNTER — Other Ambulatory Visit: Payer: Self-pay

## 2020-08-22 ENCOUNTER — Other Ambulatory Visit: Payer: Medicare PPO

## 2020-08-22 DIAGNOSIS — I712 Thoracic aortic aneurysm, without rupture, unspecified: Secondary | ICD-10-CM

## 2020-08-22 LAB — BASIC METABOLIC PANEL
BUN/Creatinine Ratio: 19 (ref 10–24)
BUN: 15 mg/dL (ref 8–27)
CO2: 29 mmol/L (ref 20–29)
Calcium: 9.5 mg/dL (ref 8.6–10.2)
Chloride: 106 mmol/L (ref 96–106)
Creatinine, Ser: 0.78 mg/dL (ref 0.76–1.27)
Glucose: 91 mg/dL (ref 65–99)
Potassium: 4.3 mmol/L (ref 3.5–5.2)
Sodium: 141 mmol/L (ref 134–144)
eGFR: 92 mL/min/{1.73_m2} (ref >59)

## 2020-08-24 ENCOUNTER — Other Ambulatory Visit: Payer: Medicare PPO

## 2020-08-24 ENCOUNTER — Ambulatory Visit (INDEPENDENT_AMBULATORY_CARE_PROVIDER_SITE_OTHER)
Admission: RE | Admit: 2020-08-24 | Discharge: 2020-08-24 | Disposition: A | Discharge status: Home / Self Care | Payer: Medicare PPO | Source: Ambulatory Visit | Attending: Interventional Cardiology | Admitting: Interventional Cardiology

## 2020-08-24 ENCOUNTER — Other Ambulatory Visit: Payer: Self-pay

## 2020-08-24 DIAGNOSIS — I77819 Aortic ectasia, unspecified site: Secondary | ICD-10-CM

## 2020-08-24 DIAGNOSIS — I712 Thoracic aortic aneurysm, without rupture, unspecified: Secondary | ICD-10-CM

## 2020-08-24 DIAGNOSIS — I7121 Aneurysm of the ascending aorta, without rupture: Secondary | ICD-10-CM

## 2020-08-24 MED ORDER — IOHEXOL 350 MG/ML SOLN
100.0000 mL | Freq: Once | INTRAVENOUS | Status: AC | PRN
  Administered 2020-08-24: 80 mL via INTRAVENOUS

## 2020-08-30 NOTE — Progress Notes (Signed)
Cardiology Office Note:    Date:  08/31/2020   ID:  [name]., DOB [birth year], MRN [id number]  PCP:  David Brunette, MD  Cardiologist:  Lesleigh Noe, MD   Referring MD: David Brunette, MD   Chief Complaint  Patient presents with   Atrial Fibrillation   Coronary Artery Disease   Thoracic Aortic Aneurysm     History of Present Illness:    [name]. is a 75 y.o. male with a hx of permanent AF, chronic anticoagulation, ascending aortic aneurysm 4.8 cm June 2022 CT essential hypertension, hyperlipidemia, masthenia gravis and nonobstructive CAD by cath 2002 (LAD luminal irregularities).   No symptoms.  Has a wearable that is demonstrating atrial fibs is continuous but with good rate control.  He is concerned he has not had a dig level lately.  He denies chest pain.  Blood pressures have been under control.  He is providing excellent data in terms of blood pressure control.  Unwilling to use a beta-blocker because of myasthenia gravis.  Past Medical History:  Diagnosis Date   A-fib (HCC)    Dysrhythmia    GERD (gastroesophageal reflux disease)    History of Bell's palsy    History of pericarditis    Hypertension    Mixed hyperlipidemia    Myasthenia gravis (HCC)    currently in remission (11/2014)     Past Surgical History:  Procedure Laterality Date   CARDIOVERSION N/A 09/21/2016   Procedure: CARDIOVERSION;  Surgeon: Thurmon Fair, MD;  Location: [facility] ENDOSCOPY;  Service: Cardiovascular;  Laterality: N/A;   CARDIOVERSION N/A 10/17/2016   Procedure: CARDIOVERSION;  Surgeon: Laurey Morale, MD;  [facility]: [facility] ENDOS[facility];  Service: Cardiovascular;  Laterality: N/A;   CARDIOVERSION N/A 10/31/2016   Procedure: CARDIOVERSION;  Surgeon: Wendall Stade, MD;  Location: [facility] ENDOSCOPY;  Service: Cardiovascular;  Laterality: N/A;   LAMINECTOMY  1991   LAPAROSCOPIC APPENDECTOMY N/A 09/10/2019   Procedure: APPENDECTOMY LAPAROSCOPIC;  Surgeon: Luretha Murphy, MD;  Location: [facility] ORS;  Service: General;  Laterality: N/A;   TEE WITHOUT CARDIOVERSION N/A 09/21/2016   Procedure: TRANSESOPHAGEAL ECHOCARDIOGRAM (TEE);  Surgeon: Thurmon Fair, MD;  Location: [facility] ENDOSCOPY;  Service: Cardiovascular;  Laterality: N/A;   TEE WITHOUT CARDIOVERSION N/A 10/31/2016   Procedure: TRANSESOPHAGEAL ECHOCARDIOGRAM (TEE);  Surgeon: Wendall Stade, MD;  Location: [facility] ENDOSCOPY;  Service: Cardiovascular;  Laterality: N/A;   TONSILLECTOMY  1950    Current Medications: Current Meds  Medication Sig   Alpha-Lipoic Acid 300 MG CAPS Take 300 mg by mouth daily.    apixaban (ELIQUIS) 5 MG TABS tablet Take 1 tablet by mouth 2 times daily.   atorvastatin (LIPITOR) 20 MG tablet TAKE 1 TABLET BY MOUTH DAILY   cholecalciferol (VITAMIN D3) 25 MCG (1000 UNIT) tablet Take 1,000 Units by mouth daily.   Coenzyme Q10 (COQ-10) 100 MG CAPS Take 100 mg by mouth daily.    digoxin (LANOXIN) 0.25 MG tablet TAKE 1 TABLET(0.25 MG) BY MOUTH DAILY   Multiple Vitamins-Minerals (CENTRUM SILVER PO) Take 1 tablet by mouth daily.   mycophenolate (CELLCEPT) 250 MG capsule Take 1 capsule (250 mg total) by mouth 2 (two) times daily.   Omega-3 Fatty Acids (OMEGA-3 PO) Take 1,040 mg by mouth daily.   pyridostigmine (MESTINON) 60 MG tablet Take 1 tablet (60 mg total) by mouth 3 (three) times daily as needed.   ramipril (ALTACE) 5 MG capsule Take 5 mg by mouth daily.   Resveratrol 100 MG CAPS Take  100 mg by mouth 2 (two) times daily.    tamsulosin (FLOMAX) 0.4 MG CAPS capsule Take 0.4 mg by mouth daily.   triamterene-hydrochlorothiazide (MAXZIDE-25) 37.5-25 MG tablet Take 1.5 tablets by mouth daily.    TURMERIC PO Take 100 mg by mouth 2 (two) times daily.     Allergies:   Demerol [meperidine], Aminoglycosides, Beta adrenergic blockers, Calcium channel blockers, Ciprofloxacin, Iodinated diagnostic agents, Penicillamine, Procainamide hcl, Quinine derivatives, Succinylcholine chloride, and Vecuronium  bromide [vecuronium]   Social History   Socioeconomic History   Marital status: Married    Spouse name: Not on file   Number of children: 1   Years of education: Grad Sch   Highest education level: Not on file  Occupational History   Occupation: Pharmacologist[employer]  Tobacco Use   Smoking status: Former    Types: Pipe    Quit date: 12/10/1966    Years since quitting: 53.7   Smokeless tobacco: Never  Vaping Use   Vaping Use: Never used  Substance and Sexual Activity   Alcohol use: Yes    Alcohol/week: 1.0 standard drink    Types: 1 Glasses of wine per week   Drug use: No   Sexual activity: Not on file  Other Topics Concern   Not on file  Social History Narrative   Lives at home with his wife.   Right-handed.   Occasional caffeine use.   Social Determinants of Health   Financial Resource Strain: Not on file  Food Insecurity: Not on file  Transportation Needs: Not on file  Physical Activity: Not on file  Stress: Not on file  Social Connections: Not on file     Family History: The patient's family history includes Cancer in his brother and mother; Healthy in his daughter; Heart disease in his father.  ROS:   Please see the history of present illness.    Spoke with wife.  She has great concern about the aneurysm.  I explained the concept of dual management with a cardiac surgeon.  We will set this up.  All other systems reviewed and are negative.  EKGs/Labs/Other Studies Reviewed:    The following studies were reviewed today: Thoracic CT SCAN 08/26/2020: IMPRESSION: 1. Ascending thoracic aortic aneurysm with maximum diameter of 4.8 cm. Ascending thoracic aortic aneurysm. Recommend semi-annual imaging followup by CTA or MRA and referral to cardiothoracic surgery if not already obtained. This recommendation follows 2010 ACCF/AHA/AATS/ACR/ASA/SCA/SCAI/SIR/STS/SVM Guidelines for the Diagnosis and Management of Patients With Thoracic Aortic Disease. Circulation. 2010;  121: Z610-R604: E266-e369. Aortic aneurysm NOS (ICD10-I71.9) 2. Coronary artery and aortic Atherosclerosis (ICD10-I70.0).     EKG:  EKG atrial fibrillation with controlled rate at 74 bpm.  Interventricular conduction delay/incomplete right bundle.  Recent Labs: 09/09/2019: B Natriuretic Peptide 114.1 09/14/2019: ALT 38; Magnesium 2.1 09/17/2019: Hemoglobin 14.9; Platelets 243 08/22/2020: BUN 15; Creatinine, Ser 0.78; Potassium 4.3; Sodium 141  Recent Lipid Panel    Component Value Date/Time   CHOL 144 09/13/2015 1041   CHOL 218 (H) 12/14/2014 0915   TRIG 106 09/14/2019 0350   TRIG 77 12/14/2014 0915   HDL 56 09/13/2015 1041   HDL 52 12/14/2014 0915   CHOLHDL 2.6 09/13/2015 1041   VLDL 12 09/13/2015 1041   LDLCALC 76 09/13/2015 1041   LDLCALC 151 (H) 12/14/2014 0915    Physical Exam:    VS:  BP 114/78   Pulse 74   Ht 5\' 7"  (1.702 m)   Wt 183 lb (83 kg)   SpO2 96%  BMI 28.66 kg/m     Wt Readings from Last 3 Encounters:  08/31/20 183 lb (83 kg)  05/31/20 189 lb (85.7 kg)  12/01/19 190 lb 8 oz (86.4 kg)     GEN: Compatible with age. No acute distress HEENT: Normal NECK: No JVD. LYMPHATICS: No lymphadenopathy CARDIAC: No murmur.  Irregularly irregular RR no gallop, or edema. VASCULAR:  Normal Pulses. No bruits. RESPIRATORY:  Clear to auscultation without rales, wheezing or rhonchi  ABDOMEN: Soft, non-tender, non-distended, No pulsatile mass, MUSCULOSKELETAL: No deformity  SKIN: Warm and dry NEUROLOGIC:  Alert and oriented x 3 PSYCHIATRIC:  Normal affect   ASSESSMENT:    1. Ascending aortic aneurysm (HCC)   2. Chronic atrial fibrillation (HCC)   3. Mixed hyperlipidemia   4. Essential hypertension   5. High coronary artery calcium score   6. Chronic anticoagulation   7. Long-term use of high-risk medication    PLAN:    In order of problems listed above:  Comanagement will be set up with Dr. Laneta Simmers.  I plan to see the patient again in 6 to 9 months.  Blood pressure  control is currently quite good.  He has not been a candidate for beta-blocker therapy over the years due to aggravation of myasthenia gravis.  He is on an ACE inhibitor and we consider switching to an ARB but with such good blood pressure control, we did not make any changes. Continue Eliquis therapy. Continue statin therapy with management of LDL to less than 70. Target blood pressure less than 130/80 mmHg.  Excellent today.  Recordings from home demonstrate rare episodes for blood pressures greater than 130 mmHg systolic. Primary prevention discussed Continue Eliquis therapy watching for bleeding.   Prolonged office visit.  Explaining follow-up management of aortic aneurysm.  Comanagement described.  Blood pressure control being the essential and most important variable.   Medication Adjustments/Labs and Tests Ordered: Current medicines are reviewed at length with the patient today.  Concerns regarding medicines are outlined above.  Orders Placed This Encounter  Procedures   Digoxin level   Ambulatory referral to Cardiothoracic Surgery   EKG 12-Lead   No orders of the defined types were placed in this encounter.   Patient Instructions  Medication Instructions:  Your physician recommends that you continue on your current medications as directed. Please refer to the Current Medication list given to you today. *If you need a refill on your cardiac medications before your next appointment, please call your pharmacy*   Lab Work: Digoxin level today  If you have labs (blood work) drawn today and your tests are completely normal, you will receive your results only by: MyChart Message (if you have MyChart) OR A paper copy in the mail If you have any lab test that is abnormal or we need to change your treatment, we will call you to review the results.   Testing/Procedures: None   Follow-Up: At [facility], you and your health needs are our priority.  As part of our continuing  mission to provide you with exceptional heart care, we have created designated Provider Care Teams.  These Care Teams include your primary Cardiologist (physician) and Advanced Practice Providers (APPs -  Physician Assistants and Nurse Practitioners) who all work together to provide you with the care you need, when you need it.  We recommend signing up for the patient portal called "MyChart".  Sign up information is provided on this After Visit Summary.  MyChart is used to connect with patients for  Virtual Visits (Telemedicine).  Patients are able to view lab/test results, encounter notes, upcoming appointments, etc.  Non-urgent messages can be sent to your provider as well.   To learn more about what you can do with MyChart, go to [email or web address].    Your next appointment:   10-12 month(s)  The format for your next appointment:   In Person  Provider:   You may see Lesleigh Noe, MD or one of the following Advanced Practice Providers on your designated Care Team:   Nada Boozer, NP    Other Instructions     Signed, Lesleigh Noe, MD  08/31/2020 10:45 AM     [facility]

## 2020-08-31 ENCOUNTER — Ambulatory Visit: Payer: Medicare PPO | Admitting: Interventional Cardiology

## 2020-08-31 ENCOUNTER — Encounter: Payer: Self-pay | Admitting: Interventional Cardiology

## 2020-08-31 ENCOUNTER — Other Ambulatory Visit: Payer: Self-pay

## 2020-08-31 VITALS — BP 114/78 | HR 74 | Ht 67.0 in | Wt 183.0 lb

## 2020-08-31 DIAGNOSIS — I482 Chronic atrial fibrillation, unspecified: Secondary | ICD-10-CM

## 2020-08-31 DIAGNOSIS — Z7901 Long term (current) use of anticoagulants: Secondary | ICD-10-CM

## 2020-08-31 DIAGNOSIS — I712 Thoracic aortic aneurysm, without rupture: Secondary | ICD-10-CM | POA: Diagnosis not present

## 2020-08-31 DIAGNOSIS — Z79899 Other long term (current) drug therapy: Secondary | ICD-10-CM | POA: Diagnosis not present

## 2020-08-31 DIAGNOSIS — E782 Mixed hyperlipidemia: Secondary | ICD-10-CM | POA: Diagnosis not present

## 2020-08-31 DIAGNOSIS — R931 Abnormal findings on diagnostic imaging of heart and coronary circulation: Secondary | ICD-10-CM | POA: Diagnosis not present

## 2020-08-31 DIAGNOSIS — I7121 Aneurysm of the ascending aorta, without rupture: Secondary | ICD-10-CM

## 2020-08-31 DIAGNOSIS — I10 Essential (primary) hypertension: Secondary | ICD-10-CM | POA: Diagnosis not present

## 2020-08-31 NOTE — Patient Instructions (Signed)
Medication Instructions:  Your physician recommends that you continue on your current medications as directed. Please refer to the Current Medication list given to you today. *If you need a refill on your cardiac medications before your next appointment, please call your pharmacy*   Lab Work: Digoxin level today  If you have labs (blood work) drawn today and your tests are completely normal, you will receive your results only by: MyChart Message (if you have MyChart) OR A paper copy in the mail If you have any lab test that is abnormal or we need to change your treatment, we will call you to review the results.   Testing/Procedures: None   Follow-Up: At [facility], you and your health needs are our priority.  As part of our continuing mission to provide you with exceptional heart care, we have created designated Provider Care Teams.  These Care Teams include your primary Cardiologist (physician) and Advanced Practice Providers (APPs -  Physician Assistants and Nurse Practitioners) who all work together to provide you with the care you need, when you need it.  We recommend signing up for the patient portal called "MyChart".  Sign up information is provided on this After Visit Summary.  MyChart is used to connect with patients for Virtual Visits (Telemedicine).  Patients are able to view lab/test results, encounter notes, upcoming appointments, etc.  Non-urgent messages can be sent to your provider as well.   To learn more about what you can do with MyChart, go to [email or web address].    Your next appointment:   10-12 month(s)  The format for your next appointment:   In Person  Provider:   You may see Lesleigh Noe, MD or one of the following Advanced Practice Providers on your designated Care Team:   Nada Boozer, NP    Other Instructions

## 2020-09-01 LAB — DIGOXIN LEVEL: Digoxin, Serum: 1 ng/mL — ABNORMAL HIGH (ref 0.5–0.9)

## 2020-09-19 ENCOUNTER — Encounter: Payer: Self-pay | Admitting: Thoracic Surgery (Cardiothoracic Vascular Surgery)

## 2020-09-19 ENCOUNTER — Other Ambulatory Visit: Payer: Self-pay

## 2020-09-19 ENCOUNTER — Institutional Professional Consult (permissible substitution): Payer: Medicare PPO | Admitting: Thoracic Surgery (Cardiothoracic Vascular Surgery)

## 2020-09-19 VITALS — BP 132/77 | HR 64 | Resp 20 | Ht 67.0 in | Wt 183.0 lb

## 2020-09-19 DIAGNOSIS — I712 Thoracic aortic aneurysm, without rupture, unspecified: Secondary | ICD-10-CM

## 2020-09-19 NOTE — Progress Notes (Signed)
PCP is [name], MD Referring Provider is [name], MD  Chief Complaint  Patient presents with   Thoracic Aortic Aneurysm    Initial surgical consult, CTA 7/13    HPI: [name] is sent for consultation regarding an ascending aneurysm.  [name] is a 75 year old man with a history of chronic atrial fibrillation, myasthenia gravis, hypertension, hyperlipidemia, pericarditis, reflux, and Bell's palsy.  [name] was diagnosed with myasthenia gravis about 15 years ago.  [name] is currently controlled with CellCept and Mestinon.  [name] is followed by Dr. Katrinka Blazing for his atrial fibrillation.  [name] recently saw him in follow-up.  An echocardiogram from a couple of years ago had shown a dilated ascending aorta.  Dr. Katrinka Blazing had a CT angio of the chest done which showed a 4.8 cm ascending aneurysm.  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  [name] is currently retired.  [name] is not having any chest pain, pressure, tightness, or shortness of breath.  His myasthenia symptoms are controlled.  [name] monitors his heart rate and blood pressure regularly.  Both are well controlled.   Past Medical History:  Diagnosis Date   A-fib (HCC)    Dysrhythmia    GERD (gastroesophageal reflux disease)    History of Bell's palsy    History of pericarditis    Hypertension    Mixed hyperlipidemia    Myasthenia gravis (HCC)    currently in remission (11/2014)     Past Surgical History:  Procedure Laterality Date   CARDIOVERSION N/A 09/21/2016   Procedure: CARDIOVERSION;  Surgeon: Thurmon Fair, MD;  Location: [facility] ENDOSCOPY;  Service: Cardiovascular;  Laterality: N/A;   CARDIOVERSION N/A 10/17/2016   Procedure: CARDIOVERSION;  Surgeon: Laurey Morale, MD;  Location: [facility] ENDOSCOPY;  Service: Cardiovascular;  Laterality: N/A;   CARDIOVERSION N/A 10/31/2016   Procedure: CARDIOVERSION;  Surgeon: Wendall Stade, MD;  Location: [facility] ENDOSCOPY;  Service: Cardiovascular;   Laterality: N/A;   LAMINECTOMY  1991   LAPAROSCOPIC APPENDECTOMY N/A 09/10/2019   Procedure: APPENDECTOMY LAPAROSCOPIC;  Surgeon: Luretha Murphy, MD;  Location: [facility] ORS;  Service: General;  Laterality: N/A;   TEE WITHOUT CARDIOVERSION N/A 09/21/2016   Procedure: TRANSESOPHAGEAL ECHOCARDIOGRAM (TEE);  Surgeon: Thurmon Fair, MD;  Location: [facility] ENDOSCOPY;  Service: Cardiovascular;  Laterality: N/A;   TEE WITHOUT CARDIOVERSION N/A 10/31/2016   Procedure: TRANSESOPHAGEAL ECHOCARDIOGRAM (TEE);  Surgeon: Wendall Stade, MD;  Location: [facility] ENDOSCOPY;  Service: Cardiovascular;  Laterality: N/A;   TONSILLECTOMY  1950    Family History  Problem Relation Age of Onset   Cancer Mother    Heart disease Father    Cancer Brother        bladder/ in remission   Healthy Daughter        age 64    Social History Social History   Tobacco Use   Smoking status: Former    Types: Pipe    Quit date: 12/10/1966    Years since quitting: 53.8   Smokeless tobacco: Never  Vaping Use   Vaping Use: Never used  Substance Use Topics   Alcohol use: Yes    Alcohol/week: 1.0 standard drink    Types: 1 Glasses of wine per week   Drug use: No    Current Outpatient Medications  Medication Sig Dispense Refill   Alpha-Lipoic Acid 300 MG CAPS Take 300 mg by mouth daily.      apixaban (ELIQUIS) 5 MG TABS tablet Take 1 tablet by mouth  2 times daily. 60 tablet 5   atorvastatin (LIPITOR) 20 MG tablet TAKE 1 TABLET BY MOUTH DAILY 30 tablet 11   cholecalciferol (VITAMIN D3) 25 MCG (1000 UNIT) tablet Take 1,000 Units by mouth daily.     Coenzyme Q10 (COQ-10) 100 MG CAPS Take 100 mg by mouth daily.      digoxin (LANOXIN) 0.25 MG tablet TAKE 1 TABLET(0.25 MG) BY MOUTH DAILY 90 tablet 2   Multiple Vitamins-Minerals (CENTRUM SILVER PO) Take 1 tablet by mouth daily.     mycophenolate (CELLCEPT) 250 MG capsule Take 1 capsule (250 mg total) by mouth 2 (two) times daily. 180 capsule 4   Omega-3 Fatty Acids (OMEGA-3 PO) Take  1,040 mg by mouth daily.     pyridostigmine (MESTINON) 60 MG tablet Take 1 tablet (60 mg total) by mouth 3 (three) times daily as needed. 90 tablet 11   ramipril (ALTACE) 5 MG capsule Take 5 mg by mouth daily.     Resveratrol 100 MG CAPS Take 100 mg by mouth 2 (two) times daily.      tamsulosin (FLOMAX) 0.4 MG CAPS capsule Take 0.4 mg by mouth daily.     triamterene-hydrochlorothiazide (MAXZIDE-25) 37.5-25 MG tablet Take 1.5 tablets by mouth daily.      TURMERIC PO Take 100 mg by mouth 2 (two) times daily.     No current facility-administered medications for this visit.    Allergies  Allergen Reactions   Demerol [Meperidine]     hallucinations   Aminoglycosides     Can not use due to mysthenia gravis   Beta Adrenergic Blockers     Can not use due to mysthenia gravis   Calcium Channel Blockers     Can not use due to mysthenia gravis   Ciprofloxacin     Can not use due to mysthenia gravis   Iodinated Diagnostic Agents     Can not use due to mysthenia gravis   Penicillamine     Can not use due to mysthenia gravis   Procainamide Hcl     Can not use due to mysthenia gravis   Quinine Derivatives     Can not use due to mysthenia gravis   Succinylcholine Chloride     Can not use due to mysthenia gravis   Vecuronium Bromide [Vecuronium]     Can not use due to mysthenia gravis    Review of Systems  Constitutional:  Negative for unexpected weight change.  HENT:  Negative for trouble swallowing and voice change.   Eyes:  Positive for visual disturbance (Being treated for macular degeneration).  Respiratory:  Negative for cough and shortness of breath.   Cardiovascular:  Positive for palpitations. Negative for chest pain and leg swelling.  Genitourinary:  Positive for frequency. Negative for dysuria.  Musculoskeletal:  Positive for arthralgias and joint swelling.  Neurological:  Negative for seizures and weakness.  Hematological:  Negative for adenopathy. Does not bruise/bleed  easily.   BP 132/77 (BP Location: Right Arm, Patient Position: Sitting)   Pulse 64   Resp 20   Ht 5\' 7"  (1.702 m)   Wt 183 lb (83 kg)   SpO2 97% Comment: RA  BMI 28.66 kg/m  Physical Exam Vitals reviewed.  Constitutional:      General: [name] is not in acute distress.    Appearance: Normal appearance.  HENT:     Head: Normocephalic and atraumatic.  Eyes:     General: No scleral icterus.    Extraocular Movements: Extraocular movements  intact.  Neck:     Vascular: No carotid bruit.  Cardiovascular:     Rate and Rhythm: Normal rate. Rhythm irregular.     Heart sounds: No murmur heard. Pulmonary:     Effort: No respiratory distress.     Breath sounds: Normal breath sounds. No wheezing or rales.  Abdominal:     General: There is no distension.     Palpations: Abdomen is soft.     Tenderness: There is no abdominal tenderness.  Musculoskeletal:     Cervical back: Neck supple.  Skin:    General: Skin is warm and dry.  Neurological:     General: No focal deficit present.     Mental Status: [name] is alert and oriented to person, place, and time.     Cranial Nerves: No cranial nerve deficit.     Motor: No weakness.     Diagnostic Tests: CT ANGIOGRAPHY CHEST WITH CONTRAST   TECHNIQUE: Multidetector CT imaging of the chest was performed using the standard protocol during bolus administration of intravenous contrast. Multiplanar CT image reconstructions and MIPs were obtained to evaluate the vascular anatomy.   CONTRAST:  80mL OMNIPAQUE IOHEXOL 350 MG/ML SOLN   COMPARISON:  Noncontrast chest CT 11/22/2014   FINDINGS: Cardiovascular: When measured orthogonally, the ascending thoracic aorta has a maximum diameter of 4.8 cm. There is calcific atherosclerosis within the coronary arteries and, to a lesser extent the aortic arch. Normal 3 vessel branch pattern. Heart size is normal. No pericardial effusion.   Mediastinum/Nodes: No enlarged mediastinal, hilar, or axillary  lymph nodes. Thyroid gland, trachea, and esophagus demonstrate no significant findings.   Lungs/Pleura: Lungs are clear. No pleural effusion or pneumothorax.   Upper Abdomen: No acute abnormality.   Musculoskeletal: No chest wall abnormality. No acute or significant osseous findings.   Review of the MIP images confirms the above findings.   IMPRESSION: 1. Ascending thoracic aortic aneurysm with maximum diameter of 4.8 cm. Ascending thoracic aortic aneurysm. Recommend semi-annual imaging followup by CTA or MRA and referral to cardiothoracic surgery if not already obtained. This recommendation follows 2010 ACCF/AHA/AATS/ACR/ASA/SCA/SCAI/SIR/STS/SVM Guidelines for the Diagnosis and Management of Patients With Thoracic Aortic Disease. Circulation. 2010; 121: B147-W295: E266-e369. Aortic aneurysm NOS (ICD10-I71.9) 2. Coronary artery and aortic Atherosclerosis (ICD10-I70.0).     Electronically Signed   By: Deatra RobinsonKevin  Herman M.D.   On: 08/26/2020 14:30 I personally reviewed the CT images.  The largest measurement I could find was 4.6 cm just at the takeoff of the innominate.  There was evidence of coronary and aortic atherosclerotic disease.  Impression: [name] is a 75 year old with a history of chronic atrial fibrillation, myasthenia gravis, hypertension, hyperlipidemia, pericarditis, reflux, and Bell's palsy.  [name] was noted echocardiogram.  A CT angio of the chest showed ascending aortic aneurysm.    Ascending aortic aneurysm/thoracic aortic atherosclerosis.  [name] is on a statin.  [name] measures his blood pressure regularly and it is under good control.  Some limitations on medications due to his myasthenia. There is no indication for surgery presently.  Indications would include size greater than 5.5 cm for an increase of more than 5 mm in 6 months.  The official measurement was 4.8 cm although I got 4.6 cm personally.  Either way the management is no different at that size.  I went back and  looked at his CT from 2016 and it appears the aorta is about a millimeter larger in each dimension compared to that scan.  The scan was done without  contrast so the measurements may not be as precise.  However, it does show there has been minimal change over the past 6 years.  The recommendation is for a CT angiogram in 6 months.  I did caution him not to take quinolone antibiotics.  Does not really issue his case because [name] is already not taking them because of myasthenia.  His wife had questions about his activities.  I I recommended [name] avoid really heavy lifting (more than 50 pounds) but [name] does not need to limit his routine activities.  Hypertension-blood pressure closely monitored and well controlled on current regimen.  Hyperlipidemia-on Lipitor.  Followed by Dr. Katrinka Blazing.  Myasthenia gravis-symptoms well controlled with CellCept and Mestinon.  Plan: Continue current medical regimen Return in 6 months with CT angio of chest  Loreli Slot, MD [facility] [phone]

## 2020-10-12 ENCOUNTER — Encounter: Payer: Medicare PPO | Admitting: Surgery

## 2020-10-13 DIAGNOSIS — R3915 Urgency of urination: Secondary | ICD-10-CM | POA: Diagnosis not present

## 2020-10-13 DIAGNOSIS — R350 Frequency of micturition: Secondary | ICD-10-CM | POA: Diagnosis not present

## 2020-10-13 DIAGNOSIS — N401 Enlarged prostate with lower urinary tract symptoms: Secondary | ICD-10-CM | POA: Diagnosis not present

## 2020-10-26 ENCOUNTER — Other Ambulatory Visit: Payer: Self-pay

## 2020-10-26 ENCOUNTER — Encounter (INDEPENDENT_AMBULATORY_CARE_PROVIDER_SITE_OTHER): Payer: Medicare PPO | Admitting: Ophthalmology

## 2020-10-26 DIAGNOSIS — H35033 Hypertensive retinopathy, bilateral: Secondary | ICD-10-CM | POA: Diagnosis not present

## 2020-10-26 DIAGNOSIS — H353221 Exudative age-related macular degeneration, left eye, with active choroidal neovascularization: Secondary | ICD-10-CM

## 2020-10-26 DIAGNOSIS — H43813 Vitreous degeneration, bilateral: Secondary | ICD-10-CM | POA: Diagnosis not present

## 2020-10-26 DIAGNOSIS — H353112 Nonexudative age-related macular degeneration, right eye, intermediate dry stage: Secondary | ICD-10-CM

## 2020-10-26 DIAGNOSIS — I10 Essential (primary) hypertension: Secondary | ICD-10-CM | POA: Diagnosis not present

## 2020-10-31 DIAGNOSIS — Z23 Encounter for immunization: Secondary | ICD-10-CM | POA: Diagnosis not present

## 2020-10-31 DIAGNOSIS — E782 Mixed hyperlipidemia: Secondary | ICD-10-CM | POA: Diagnosis not present

## 2020-10-31 DIAGNOSIS — G7000 Myasthenia gravis without (acute) exacerbation: Secondary | ICD-10-CM | POA: Diagnosis not present

## 2020-10-31 DIAGNOSIS — I712 Thoracic aortic aneurysm, without rupture: Secondary | ICD-10-CM | POA: Diagnosis not present

## 2020-10-31 DIAGNOSIS — M6281 Muscle weakness (generalized): Secondary | ICD-10-CM | POA: Diagnosis not present

## 2020-10-31 DIAGNOSIS — I4819 Other persistent atrial fibrillation: Secondary | ICD-10-CM | POA: Diagnosis not present

## 2020-10-31 DIAGNOSIS — I2510 Atherosclerotic heart disease of native coronary artery without angina pectoris: Secondary | ICD-10-CM | POA: Diagnosis not present

## 2020-10-31 DIAGNOSIS — I10 Essential (primary) hypertension: Secondary | ICD-10-CM | POA: Diagnosis not present

## 2020-10-31 DIAGNOSIS — E559 Vitamin D deficiency, unspecified: Secondary | ICD-10-CM | POA: Diagnosis not present

## 2020-11-11 ENCOUNTER — Other Ambulatory Visit: Payer: Self-pay | Admitting: Interventional Cardiology

## 2020-12-05 ENCOUNTER — Other Ambulatory Visit: Payer: Self-pay

## 2020-12-05 ENCOUNTER — Encounter: Payer: Self-pay | Admitting: Adult Health

## 2020-12-05 ENCOUNTER — Ambulatory Visit: Payer: Medicare PPO | Admitting: Adult Health

## 2020-12-05 VITALS — BP 123/85 | HR 64 | Ht 67.0 in | Wt 195.2 lb

## 2020-12-05 DIAGNOSIS — G7000 Myasthenia gravis without (acute) exacerbation: Secondary | ICD-10-CM

## 2020-12-05 NOTE — Progress Notes (Signed)
PATIENT: [name]. DOB: [birth year]  REASON FOR VISIT: follow up HISTORY FROM: patient   HISTORY OF PRESENT ILLNESS: Today 12/05/20 [name] is a 75 y.o. male with a history of myasthenia gravis here today for a follow up. He remains on Cellcept 250 mg twice daily and Mestinon 60 mg as needed. He is currently taking the Mestinon twice a day. His energy level is good in the morning and afternoons are okay if he takes a nap. But he reports his energy levels were much better on the increased dose of Cellcept. He endorses that he is not walking as much as he has in the past. He recently had a chest CT that showed an aneurysm. He was told to limit heavy lifting and straining. He continues to walk the dog, but is worried about elevating his heartrate too much. Denies any issues with balance, denies any falls. He continues to operate a motor vehicle, live at home with his wife, and complete all ADL's without difficulty.   10/31/2020 CBC LY# 0.7, LY% 9.6  PLTs 140  HISTORY    UPDATE Jan 14th 2020: He continue have double vision, especially since his metoprolol dose was jumped from 25 to 50 mg every day in December 2019, despite stopping it shortly afterwards, he now complains of intermittent double vision, getting worse when tired, difficulty reading, generalized fatigue, he can only walk his dog 0.5 miles instead of previous 1.5 miles each day, deep achy muscle fatigue tightness sensation with exertion, Mestinon 60 mg up to 3 times a day was helpful   UPDATE May 27 2018: 1 months after he is on higher dose of CellCept, his symptoms has much improved, he no longer had generalized fatigue, is able to walk 5 miles each day, only require Mestinon once as needed,   He has no trouble walking, no trouble getting up from sitting down position   UPDATE Nov 27 2018: He is now taking CellCept 500 mg twice a day, he is doing very well, no gait abnormality, no double vision, rarely take  Mestinon    UPDATE Dec 01 2019: Patient was admitted to hospital on 09/10/2019 for sepsis secondary to acute gangrene appendicitis, peritonitis, status post laparoscopic appendectomy, treated with prolonged antibiotics,   He noticed increased generalized fatigue following his surgery, which is similar to his presenting symptoms for myasthenia gravis, for a while he self titrated CellCept to 250 mg 2 tablets twice a day until about 2 months ago, now back down to 250 mg twice a day   Update May 31, 2020 SS: here today alone, remains on Cellcept 250 mg twice daily, Mestinon 60 mg twice daily 6 AM, takes afternoon nap, then 2nd dose at 6 PM. Feeling less fatigued, now that warmer weather is here, sunshine. Does 2 walks with dogs daily, AM 1 hour, PM 0.5 mile for 30 minutes, used to be able to walk further, gauges this as his fatigue level.  REVIEW OF SYSTEMS: Out of a complete 14 system review of symptoms, the patient complains only of the following symptoms, and all other reviewed systems are negative.  ALLERGIES: Allergies  Allergen Reactions   Demerol [Meperidine]     hallucinations   Aminoglycosides     Can not use due to mysthenia gravis   Beta Adrenergic Blockers     Can not use due to mysthenia gravis   Calcium Channel Blockers     Can not use due to mysthenia gravis  Ciprofloxacin     Can not use due to mysthenia gravis   Iodinated Diagnostic Agents     Can not use due to mysthenia gravis   Penicillamine     Can not use due to mysthenia gravis   Procainamide Hcl     Can not use due to mysthenia gravis   Quinine Derivatives     Can not use due to mysthenia gravis   Succinylcholine Chloride     Can not use due to mysthenia gravis   Vecuronium Bromide [Vecuronium]     Can not use due to mysthenia gravis    HOME MEDICATIONS: Outpatient Medications Prior to Visit  Medication Sig Dispense Refill   Alpha-Lipoic Acid 300 MG CAPS Take 300 mg by mouth daily.      apixaban  (ELIQUIS) 5 MG TABS tablet Take 1 tablet by mouth 2 times daily. 60 tablet 5   atorvastatin (LIPITOR) 20 MG tablet TAKE 1 TABLET BY MOUTH DAILY 30 tablet 11   cholecalciferol (VITAMIN D3) 25 MCG (1000 UNIT) tablet Take 1,000 Units by mouth daily.     Coenzyme Q10 (COQ-10) 100 MG CAPS Take 100 mg by mouth daily.      digoxin (LANOXIN) 0.25 MG tablet TAKE 1 TABLET(0.25 MG) BY MOUTH DAILY 90 tablet 2   Multiple Vitamins-Minerals (CENTRUM SILVER PO) Take 1 tablet by mouth daily.     mycophenolate (CELLCEPT) 250 MG capsule Take 1 capsule (250 mg total) by mouth 2 (two) times daily. 180 capsule 4   Omega-3 Fatty Acids (OMEGA-3 PO) Take 1,040 mg by mouth daily.     pyridostigmine (MESTINON) 60 MG tablet Take 1 tablet (60 mg total) by mouth 3 (three) times daily as needed. 90 tablet 11   ramipril (ALTACE) 5 MG capsule Take 5 mg by mouth daily.     Resveratrol 100 MG CAPS Take 100 mg by mouth 2 (two) times daily.      tamsulosin (FLOMAX) 0.4 MG CAPS capsule Take 0.4 mg by mouth daily.     triamterene-hydrochlorothiazide (MAXZIDE-25) 37.5-25 MG tablet Take 1.5 tablets by mouth daily.      TURMERIC PO Take 100 mg by mouth 2 (two) times daily.     No facility-administered medications prior to visit.    PAST MEDICAL HISTORY: Past Medical History:  Diagnosis Date   A-fib (HCC)    Dysrhythmia    GERD (gastroesophageal reflux disease)    History of Bell's palsy    History of pericarditis    Hypertension    Mixed hyperlipidemia    Myasthenia gravis (HCC)    currently in remission (11/2014)     PAST SURGICAL HISTORY: Past Surgical History:  Procedure Laterality Date   CARDIOVERSION N/A 09/21/2016   Procedure: CARDIOVERSION;  Surgeon: Thurmon Fair, MD;  Location: [facility] ENDOSCOPY;  Service: Cardiovascular;  Laterality: N/A;   CARDIOVERSION N/A 10/17/2016   Procedure: CARDIOVERSION;  Surgeon: Laurey Morale, MD;  Location: [facility] ENDOSCOPY;  Service: Cardiovascular;  Laterality: N/A;   CARDIOVERSION N/A  10/31/2016   Procedure: CARDIOVERSION;  Surgeon: Wendall Stade, MD;  Location: [facility] ENDOSCOPY;  Service: Cardiovascular;  Laterality: N/A;   LAMINECTOMY  1991   LAPAROSCOPIC APPENDECTOMY N/A 09/10/2019   Procedure: APPENDECTOMY LAPAROSCOPIC;  Surgeon: Luretha Murphy, MD;  Location: [facility] ORS;  Service: General;  Laterality: N/A;   TEE WITHOUT CARDIOVERSION N/A 09/21/2016   Procedure: TRANSESOPHAGEAL ECHOCARDIOGRAM (TEE);  Surgeon: Thurmon Fair, MD;  Location[facility] ENDOSCOPY;  Service: Cardiovascular;  Laterality: N/A;   TEE WITHOUT  CARDIOVERSION N/A 10/31/2016   Procedure: TRANSESOPHAGEAL ECHOCARDIOGRAM (TEE);  Surgeon: Wendall Stade, MD;  Location: [facility] ENDOSCOPY;  Service: Cardiovascular;  Laterality: N/A;   TONSILLECTOMY  1950    FAMILY HISTORY: Family History  Problem Relation Age of Onset   Cancer Mother    Heart disease Father    Cancer Brother        bladder/ in remission   Healthy Daughter        age 37    SOCIAL HISTORY: Social History   Socioeconomic History   Marital status: Married    Spouse name: Not on file   Number of children: 1   Years of education: Grad Sch   Highest education level: Not on file  Occupational History   Occupation: Pharmacologist  Tobacco Use   Smoking status: Former    Types: Pipe    Quit date: 12/10/1966    Years since quitting: 54.0   Smokeless tobacco: Never  Vaping Use   Vaping Use: Never used  Substance and Sexual Activity   Alcohol use: Yes    Alcohol/week: 1.0 standard drink    Types: 1 Glasses of wine per week   Drug use: No   Sexual activity: Not on file  Other Topics Concern   Not on file  Social History Narrative   Lives at home with his wife.   Right-handed.   Occasional caffeine use.   Social Determinants of Health   Financial Resource Strain: Not on file  Food Insecurity: Not on file  Transportation Needs: Not on file  Physical Activity: Not on file  Stress: Not on file  Social Connections: Not on file   Intimate Partner Violence: Not on file      PHYSICAL EXAM  Vitals:   12/05/20 1009  BP: 123/85  Pulse: 64  Weight: 195 lb 3.2 oz (88.5 kg)  Height: 5\' 7"  (1.702 m)   Body mass index is 30.57 kg/m.  Generalized: Well developed, in no acute distress   Neurological examination  Mentation: Alert oriented to time, place, history taking. Follows all commands speech and language fluent Cranial nerve II-XII: Pupils were equal round reactive to light. Extraocular movements were full, visual field were full on confrontational test. Facial sensation and strength were normal. Uvula tongue midline. Head turning and shoulder shrug  were normal and symmetric.  With superior gaze held for 1 minute no diplopia or ptosis noted. Motor: The motor testing reveals 5 over 5 strength of all 4 extremities. Good symmetric motor tone is noted throughout.  With arms abducted for 1 minute no weakness noted Sensory: Sensory testing is intact to soft touch on all 4 extremities. No evidence of extinction is noted.  Coordination: Cerebellar testing reveals good finger-nose-finger and heel-to-shin bilaterally.  Gait and station: Gait is normal. Tandem gait is normal. Romberg is negative. No drift is seen.  Reflexes: Deep tendon reflexes are symmetric and normal bilaterally.   DIAGNOSTIC DATA (LABS, IMAGING, TESTING) - I reviewed patient records, labs, notes, testing and imaging myself where available.  Lab Results  Component Value Date   WBC 11.4 (H) 09/17/2019   HGB 14.9 09/17/2019   HCT 45.7 09/17/2019   MCV 98.3 09/17/2019   PLT 243 09/17/2019      Component Value Date/Time   NA 141 08/22/2020 0840   K 4.3 08/22/2020 0840   CL 106 08/22/2020 0840   CO2 29 08/22/2020 0840   GLUCOSE 91 08/22/2020 0840   GLUCOSE 88 09/17/2019 0349  BUN 15 08/22/2020 0840   CREATININE 0.78 08/22/2020 0840   CREATININE 0.65 (L) 01/13/2015 1035   CALCIUM 9.5 08/22/2020 0840   PROT 6.2 (L) 09/14/2019 0350   PROT 6.4  08/05/2019 1311   ALBUMIN 3.0 (L) 09/14/2019 0350   ALBUMIN 4.3 08/05/2019 1311   AST 42 (H) 09/14/2019 0350   ALT 38 09/14/2019 0350   ALKPHOS 53 09/14/2019 0350   BILITOT 0.7 09/14/2019 0350   BILITOT 0.5 08/05/2019 1311   GFRNONAA >60 09/17/2019 0349   GFRAA >60 09/17/2019 0349   Lab Results  Component Value Date   CHOL 144 09/13/2015   HDL 56 09/13/2015   LDLCALC 76 09/13/2015   TRIG 106 09/14/2019   CHOLHDL 2.6 09/13/2015   No results found for: HGBA1C No results found for: VITAMINB12 Lab Results  Component Value Date   TSH 1.150 05/29/2017      ASSESSMENT AND PLAN 75 y.o. year old male  has a past medical history of A-fib (HCC), Dysrhythmia, GERD (gastroesophageal reflux disease), History of Bell's palsy, History of pericarditis, Hypertension, Mixed hyperlipidemia, and Myasthenia gravis (HCC). here with    Myasthenia Gravis  - Increase Cellcept to 500 mg in the AM and 250 mg in the evening. -Continue Mestinon 60 mg as needed (currently doing twice daily - Follow up in 6 months or sooner if needed  Butch Penny, MSN, NP-C 12/05/2020, 10:05 AM [facility] [address] [place], [place] [address] [phone]

## 2020-12-06 ENCOUNTER — Encounter: Payer: Self-pay | Admitting: Adult Health

## 2020-12-06 MED ORDER — MYCOPHENOLATE MOFETIL 250 MG PO CAPS: ORAL_CAPSULE | ORAL | 1 refills | Status: DC

## 2020-12-07 NOTE — Telephone Encounter (Signed)
Attempted to call [facility] twice kept on hold for 9 min.  Will attempt again later.

## 2021-01-14 NOTE — Progress Notes (Signed)
Chart reviewed, agree above plan ?

## 2021-01-26 DIAGNOSIS — N281 Cyst of kidney, acquired: Secondary | ICD-10-CM | POA: Diagnosis not present

## 2021-01-26 DIAGNOSIS — R350 Frequency of micturition: Secondary | ICD-10-CM | POA: Diagnosis not present

## 2021-01-26 DIAGNOSIS — N401 Enlarged prostate with lower urinary tract symptoms: Secondary | ICD-10-CM | POA: Diagnosis not present

## 2021-02-07 ENCOUNTER — Other Ambulatory Visit: Payer: Self-pay | Admitting: Thoracic Surgery (Cardiothoracic Vascular Surgery)

## 2021-02-09 ENCOUNTER — Other Ambulatory Visit: Payer: Self-pay | Admitting: Interventional Cardiology

## 2021-02-09 DIAGNOSIS — I482 Chronic atrial fibrillation, unspecified: Secondary | ICD-10-CM

## 2021-02-09 NOTE — Telephone Encounter (Signed)
Prescription refill request for Eliquis received. Indication: Afib  Last office visit: 08/31/20 Katrinka Blazing)  Scr: 0.78 (08/22/20)  Age: 75 Weight: 88.5kg   Appropriate dose and refill sent to requested pharmacy.

## 2021-02-10 ENCOUNTER — Other Ambulatory Visit: Payer: Self-pay | Admitting: Thoracic Surgery (Cardiothoracic Vascular Surgery)

## 2021-02-10 DIAGNOSIS — I712 Thoracic aortic aneurysm, without rupture, unspecified: Secondary | ICD-10-CM

## 2021-02-14 ENCOUNTER — Other Ambulatory Visit: Payer: Self-pay

## 2021-02-14 MED ORDER — DIPHENHYDRAMINE HCL 50 MG PO TABS: 50.0000 mg | ORAL_TABLET | ORAL | 0 refills | Status: DC

## 2021-02-14 MED ORDER — PREDNISONE 50 MG PO TABS: 50.0000 mg | ORAL_TABLET | ORAL | 0 refills | Status: DC

## 2021-02-14 NOTE — Progress Notes (Signed)
Contacted patient and made him aware of pre-medication before Ct chest scheduled due to contrast dye allergy. Patient acknowledged receipt. Scripts sent into patient's preferred pharmacy.

## 2021-02-22 ENCOUNTER — Other Ambulatory Visit: Payer: Self-pay

## 2021-02-22 ENCOUNTER — Encounter (INDEPENDENT_AMBULATORY_CARE_PROVIDER_SITE_OTHER): Payer: Medicare PPO | Admitting: Ophthalmology

## 2021-02-22 DIAGNOSIS — I10 Essential (primary) hypertension: Secondary | ICD-10-CM

## 2021-02-22 DIAGNOSIS — H43813 Vitreous degeneration, bilateral: Secondary | ICD-10-CM | POA: Diagnosis not present

## 2021-02-22 DIAGNOSIS — H353221 Exudative age-related macular degeneration, left eye, with active choroidal neovascularization: Secondary | ICD-10-CM

## 2021-02-22 DIAGNOSIS — H35033 Hypertensive retinopathy, bilateral: Secondary | ICD-10-CM

## 2021-02-22 DIAGNOSIS — H353112 Nonexudative age-related macular degeneration, right eye, intermediate dry stage: Secondary | ICD-10-CM

## 2021-04-02 IMAGING — CT CT ABDOMEN AND PELVIS WITHOUT CONTRAST
1 of 2 series · 13 of 32 positions shown, 18 images · non-contrast
Comparison: CT the abdomen and pelvis 11/14/2016.

CLINICAL DATA: 72-year-old male with history of right lower
quadrant abdominal pain since March 2018.

EXAM:
CT ABDOMEN AND PELVIS WITHOUT CONTRAST
TECHNIQUE: Multidetector CT imaging of the abdomen and pelvis was performed
following the standard protocol without IV contrast.

[Series 2: abd/pelvis w/(date) · axial · 0.93mm/px · z∈[-471,-51]mm · 13 of 94 slices shown, 18 images]
[im 5/94  soft-tissue]
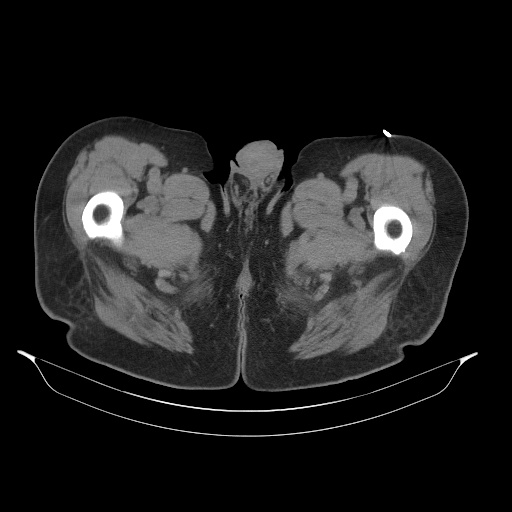
[im 5/94  bone]
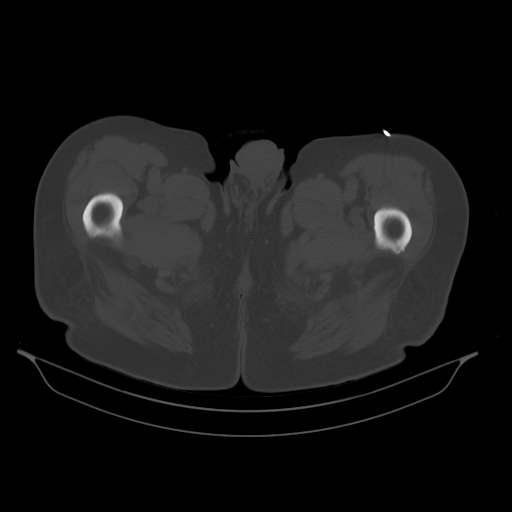
[im 14/94  soft-tissue]
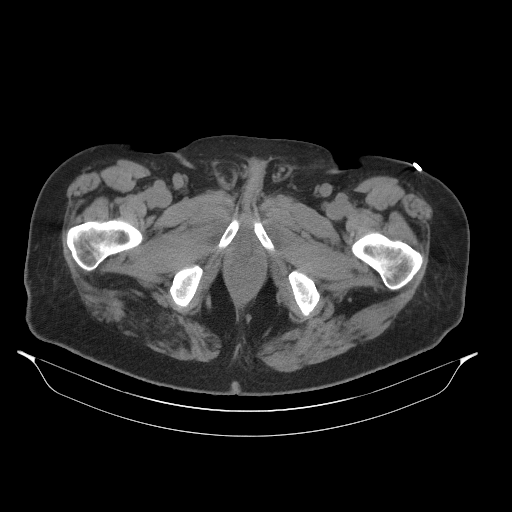
[im 19/94  soft-tissue]
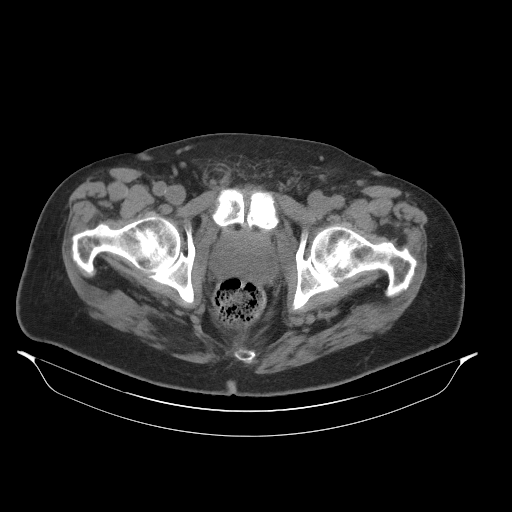
[im 28/94  soft-tissue]
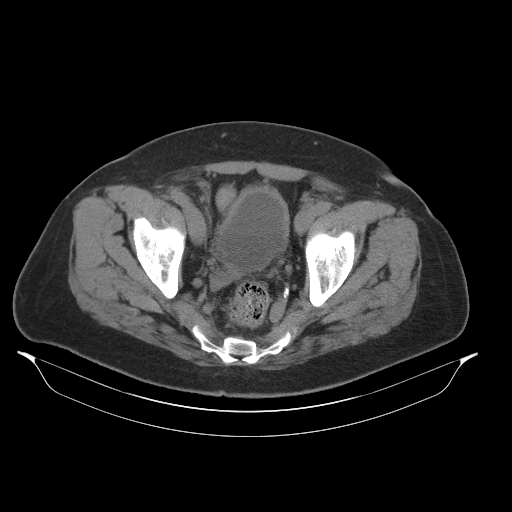
[im 38/94  soft-tissue]
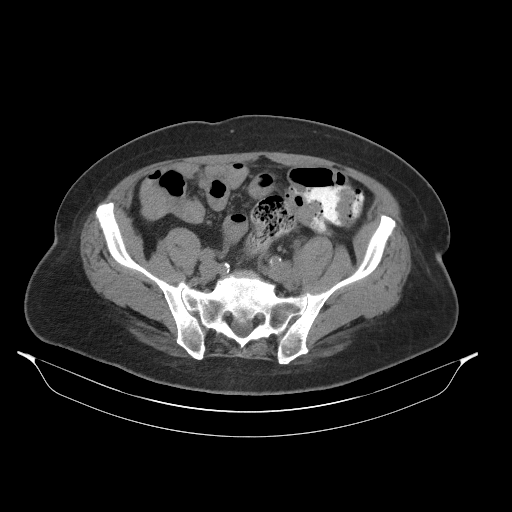
[im 42/94  soft-tissue]
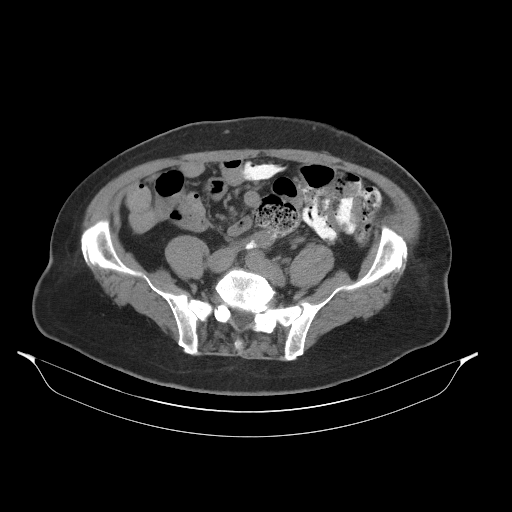
[im 52/94  soft-tissue]
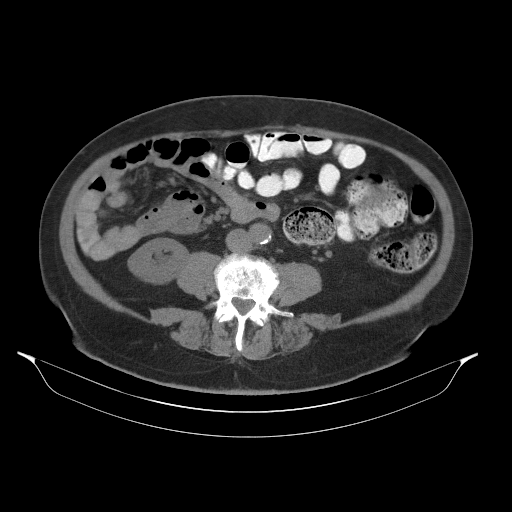
[im 56/94  soft-tissue]
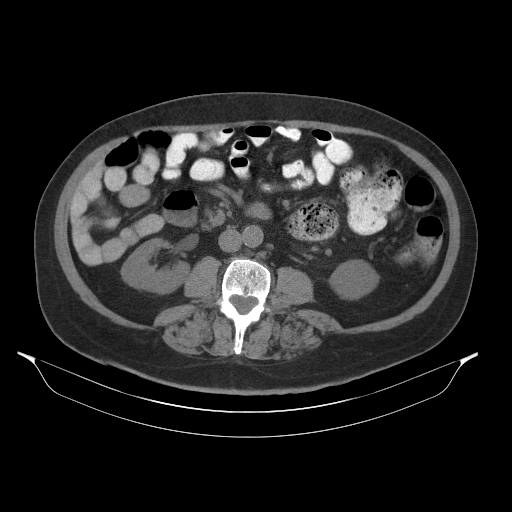
[im 66/94  soft-tissue]
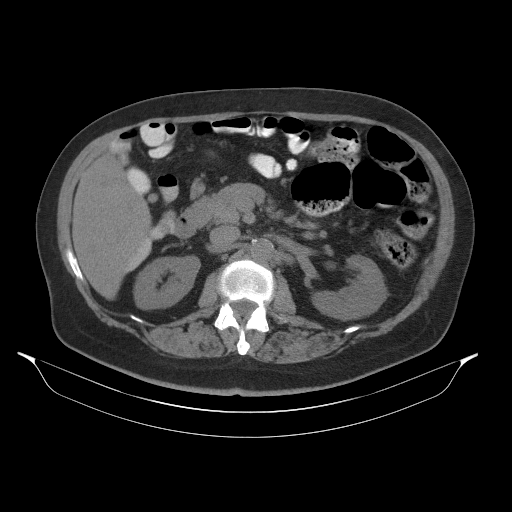
[im 66/94  bone]
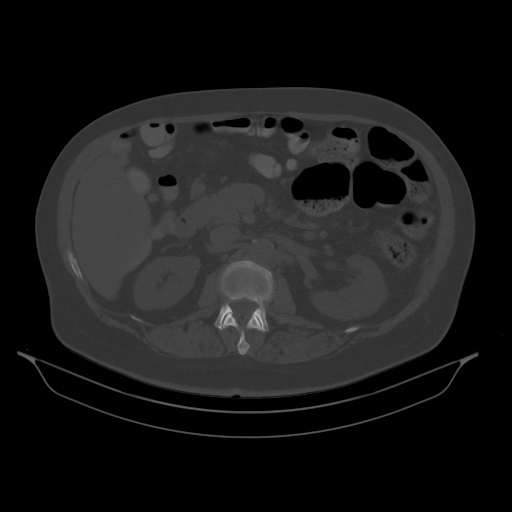
[im 75/94  soft-tissue]
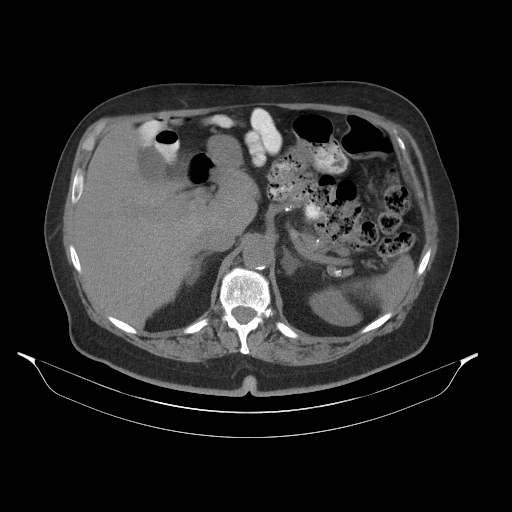
[im 75/94  lung]
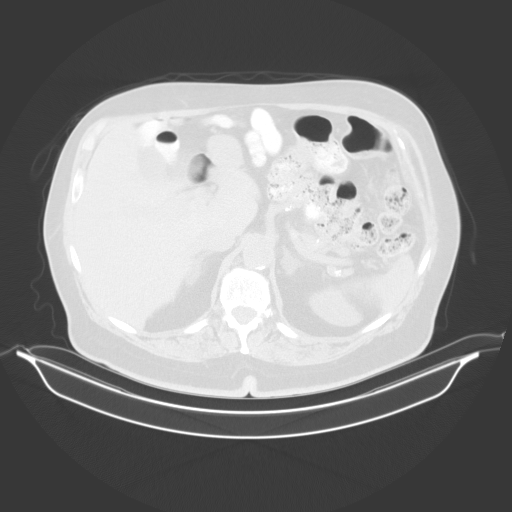
[im 80/94  soft-tissue]
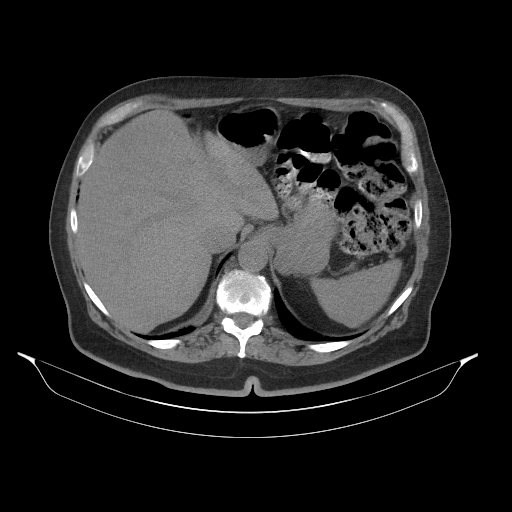
[im 80/94  lung]
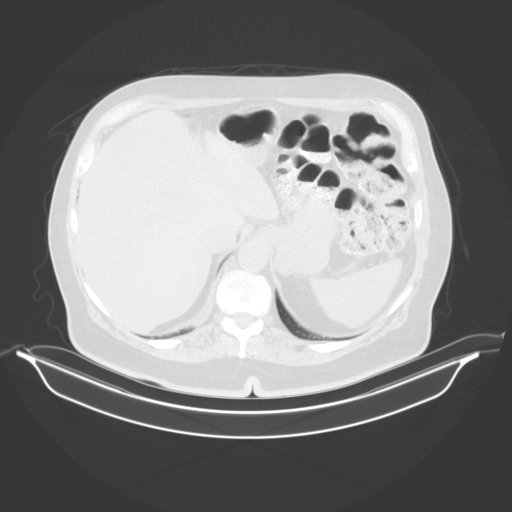
[im 84/94  lung]
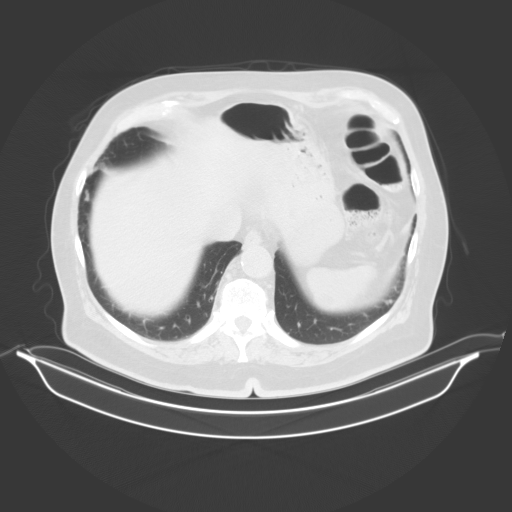
[im 89/94  soft-tissue]
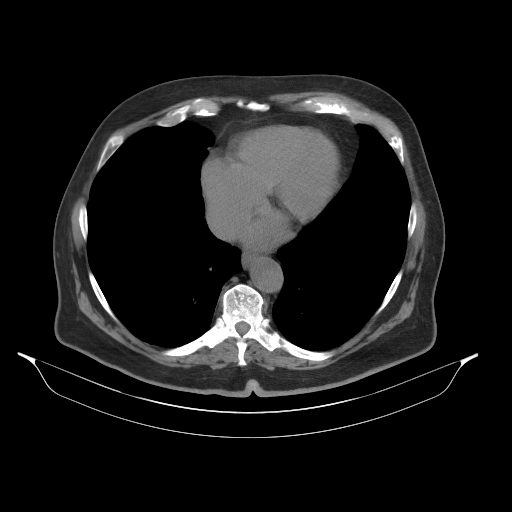
[im 89/94  lung]
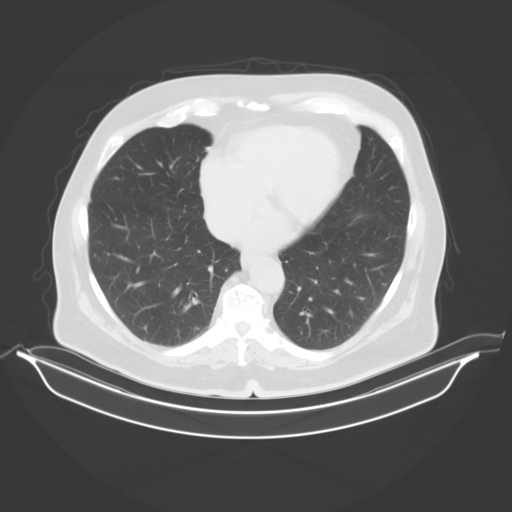

[13 of 32 positions shown; findings below may reference images not displayed]

FINDINGS: Lower chest: Mild cardiomegaly with left atrial dilatation. 3 mm
pulmonary nodule in the periphery of the left lower lobe (axial
image 26 of series 5), stable compared to 11/14/2016, considered
definitively benign.

Hepatobiliary: No definite cystic or solid hepatic lesions are
confidently identified on today's noncontrast CT examination.
Gallbladder is normal in appearance.

Pancreas: No definite pancreatic mass or peripancreatic fluid or
inflammatory changes are noted on today's noncontrast CT
examination.

Spleen: Unremarkable.

Adrenals/Urinary Tract: 2 mm nonobstructive calculus in the
interpolar collecting system of the left kidney. No additional
calculi are noted within the right renal collecting system, along
the course of either ureter, or within the lumen of the urinary
bladder. No hydroureteronephrosis. Low-attenuation lesions in the
interpolar region of the left kidney measuring up to 2.8 cm in
diameter, incompletely characterized on today's non-contrast CT
examination, but similar to prior studies, statistically likely to
represent cysts. Unenhanced appearance of the urinary bladder is
unremarkable. Bilateral adrenal glands are normal in appearance.

Stomach/Bowel: Unenhanced appearance of the stomach is normal. No
pathologic dilatation of small bowel or colon. Bowel malrotation
(normal variant) incidentally noted. Normal appendix.

Vascular/Lymphatic: Aortic atherosclerosis. No lymphadenopathy noted
in the abdomen or pelvis.

Reproductive: Prostate gland and seminal vesicles are unremarkable
in appearance.

Other: No significant volume of ascites. No pneumoperitoneum.

Musculoskeletal: Several small sclerotic lesions with narrow zones
of transition again noted in the pelvis bilaterally, largest of
which is in the right ilium adjacent to the sacroiliac joint
measuring 2.2 x 1.0 cm (axial image 60 of series 2), stable compared
to prior studies, presumably bone islands.
IMPRESSION: 1. No acute findings are noted in the abdomen or pelvis to account
for the patient's symptoms.
2. Bowel malrotation (normal variant) incidentally noted. No signs
of associated bowel obstruction at this time.
3. Normal appendix.
4. Cardiomegaly with left atrial dilatation.
5. Aortic atherosclerosis.
6. Additional incidental findings, as above.

## 2021-04-06 ENCOUNTER — Inpatient Hospital Stay: Admission: RE | Admit: 2021-04-06 | Payer: Medicare PPO | Source: Ambulatory Visit

## 2021-04-07 ENCOUNTER — Other Ambulatory Visit: Payer: Medicare PPO

## 2021-04-10 ENCOUNTER — Ambulatory Visit
Admission: RE | Admit: 2021-04-10 | Discharge: 2021-04-10 | Disposition: A | Discharge status: Home / Self Care | Payer: Medicare PPO | Source: Ambulatory Visit | Attending: Thoracic Surgery (Cardiothoracic Vascular Surgery) | Admitting: Thoracic Surgery (Cardiothoracic Vascular Surgery)

## 2021-04-10 ENCOUNTER — Other Ambulatory Visit: Payer: Self-pay

## 2021-04-10 DIAGNOSIS — I700 Atherosclerosis of aorta: Secondary | ICD-10-CM | POA: Diagnosis not present

## 2021-04-10 DIAGNOSIS — I712 Thoracic aortic aneurysm, without rupture, unspecified: Secondary | ICD-10-CM

## 2021-04-10 MED ORDER — IOPAMIDOL (ISOVUE-370) INJECTION 76%
60.0000 mL | Freq: Once | INTRAVENOUS | Status: AC | PRN
  Administered 2021-04-10: 60 mL via INTRAVENOUS

## 2021-04-11 ENCOUNTER — Ambulatory Visit: Payer: Medicare PPO | Admitting: Thoracic Surgery (Cardiothoracic Vascular Surgery)

## 2021-04-18 ENCOUNTER — Encounter: Payer: Self-pay | Admitting: Thoracic Surgery (Cardiothoracic Vascular Surgery)

## 2021-04-18 ENCOUNTER — Ambulatory Visit: Payer: Medicare PPO | Admitting: Thoracic Surgery (Cardiothoracic Vascular Surgery)

## 2021-04-18 ENCOUNTER — Other Ambulatory Visit: Payer: Self-pay

## 2021-04-18 DIAGNOSIS — I712 Thoracic aortic aneurysm, without rupture, unspecified: Secondary | ICD-10-CM | POA: Insufficient documentation

## 2021-04-18 NOTE — Progress Notes (Signed)
? ?   ?[address] ?      [name] [address] ?            [phone]   ? ? ?HPI: [name] returns for follow-up of his ascending aneurysm.   ? ?[name], [name]. is a 76 year old man with a past medical history significant for an ascending aortic aneurysm, chronic atrial fibrillation, myasthenia gravis, hypertension, hyperlipidemia, pericarditis, reflux, and Bell's palsy.  In 2022 he had a CT of the chest to evaluate a dilated ascending aorta noted on echocardiogram.  Male was reported to show a 4.8 cm ascending aneurysm.  It appeared considerably smaller than that to my measurements. ? ?Over the past 6 months has been feeling well.  He denies any chest pain, pressure, tightness, shortness of breath.  He monitors his blood pressure on a regular basis.  His systolic pressure is typically between 100- 120.  His myasthenia symptoms are well controlled. ? ?Past Medical History:  ?Diagnosis Date  ? A-fib (HCC)   ? Dysrhythmia   ? GERD (gastroesophageal reflux disease)   ? History of Bell's palsy   ? History of pericarditis   ? Hypertension   ? Mixed hyperlipidemia   ? Myasthenia gravis (HCC)   ? currently in remission (11/2014)   ? ? ? ?Current Outpatient Medications  ?Medication Sig Dispense Refill  ? Alpha-Lipoic Acid 300 MG CAPS Take 300 mg by mouth daily.     ? atorvastatin (LIPITOR) 20 MG tablet TAKE 1 TABLET BY MOUTH DAILY 30 tablet 11  ? cholecalciferol (VITAMIN D3) 25 MCG (1000 UNIT) tablet Take 1,000 Units by mouth daily.    ? Coenzyme Q10 (COQ-10) 100 MG CAPS Take 100 mg by mouth daily.     ? digoxin (LANOXIN) 0.25 MG tablet TAKE 1 TABLET(0.25 MG) BY MOUTH DAILY 90 tablet 2  ? ELIQUIS 5 MG TABS tablet TAKE 1 TABLET BY MOUTH TWICE DAILY 60 tablet 5  ? GEMTESA 75 MG TABS Take by mouth.    ? Multiple Vitamins-Minerals (CENTRUM SILVER PO) Take 1 tablet by mouth daily.    ? mycophenolate (CELLCEPT) 250 MG capsule Take 500 mg by mouth every morning and 250 mg by mouth every evening. 270  capsule 1  ? Omega-3 Fatty Acids (OMEGA-3 PO) Take 1,040 mg by mouth daily.    ? pyridostigmine (MESTINON) 60 MG tablet Take 1 tablet (60 mg total) by mouth 3 (three) times daily as needed. 90 tablet 11  ? ramipril (ALTACE) 5 MG capsule Take 5 mg by mouth daily.    ? Resveratrol 100 MG CAPS Take 100 mg by mouth 2 (two) times daily.     ? tamsulosin (FLOMAX) 0.4 MG CAPS capsule Take 0.4 mg by mouth in the morning and at bedtime.    ? triamterene-hydrochlorothiazide (MAXZIDE-25) 37.5-25 MG tablet Take 1 tablet by mouth daily.    ? TURMERIC PO Take 100 mg by mouth 2 (two) times daily.    ? ?No current facility-administered medications for this visit.  ? ? ?Physical Exam ?BP (!) 148/75 (BP Location: Left Arm, Patient Position: Sitting, Cuff Size: Normal)   Pulse 63   Resp 20   Ht 5\' 7"  (1.702 m)   Wt 195 lb 6.4 oz (88.6 kg)   SpO2 95% Comment: RA  BMI 30.83 kg/m?  ?76 year old man in no acute distress ?Alert and oriented x3  ?Cardiac irregularly irregular ?Lungs clear ?No peripheral edema ? ?Diagnostic Tests: ?CT ANGIOGRAPHY CHEST WITH CONTRAST ?  ?  TECHNIQUE: ?Multidetector CT imaging of the chest was performed using the ?standard protocol during bolus administration of intravenous ?contrast. Multiplanar CT image reconstructions and MIPs were ?obtained to evaluate the vascular anatomy. ?  ?RADIATION DOSE REDUCTION: This exam was performed according to the ?departmental dose-optimization program which includes automated ?exposure control, adjustment of the mA and/or kV according to ?patient size and/or use of iterative reconstruction technique. ?  ?CONTRAST:  41mL ISOVUE-370 IOPAMIDOL (ISOVUE-370) INJECTION 76% ?  ?COMPARISON:  08/24/2020 ?  ?FINDINGS: ?Cardiovascular: Stable mild fusiform aneurysmal dilatation of the ?ascending thoracic aorta, remeasured with a maximal diameter of 43 ?mm. Remainder of the thoracic aorta is normal in caliber. Patent ?bovine arch anatomy. No acute aortic process including  dissection. ?No focal wall abnormality or intramural hematoma. No mediastinal ?hemorrhage or hematoma. ?  ?Normal heart size. Native coronary atherosclerosis noted. No ?pericardial effusion. Pulmonary arteries are not assessed on this ?exam. ?  ?Mediastinum/Nodes: No enlarged mediastinal, hilar, or axillary lymph ?nodes. Thyroid gland, trachea, and esophagus demonstrate no ?significant findings. ?  ?Lungs/Pleura: Lungs are clear. No pleural effusion or pneumothorax. ?  ?Upper Abdomen: No acute upper abdominal finding. Abdominal ?atherosclerosis noted. Celiac, SMA and main renal arteries are ?widely patent. Left renal cyst evident. Nonobstructing punctate left ?nephrolithiasis noted. ?  ?Musculoskeletal: Degenerative changes throughout the spine. No acute ?osseous finding. Intact sternum. ?  ?Review of the MIP images confirms the above findings. ?  ?IMPRESSION: ?Stable ascending thoracic aortic aneurysm remeasured at 43 mm. ?  ?Recommend annual imaging followup by CTA or MRA. This recommendation ?follows 2010 ACCF/AHA/AATS/ACR/ASA/SCA/SCAI/SIR/STS/SVM Guidelines ?for the Diagnosis and Management of Patients with Thoracic Aortic ?Disease. Circulation. 2010; 121: [id number]. Aortic aneurysm NOS ?(ICD10-I71.9) ?  ?Native coronary atherosclerosis and aortic atherosclerosis. ?  ?No other acute intrathoracic finding by CTA. ?  ?Aortic Atherosclerosis (ICD10-I70.0). ?  ?  ?Electronically Signed ?  By: Judie Petit.  Shick M.D. ?  On: 04/10/2021 15:38 ?I personally reviewed the CT images.  I see no change from the scan from 6 months ago.  I measured the ascending aorta maximal size at 4.5 cm which is consistent with my measurements from the last scan.  Going back to his scan from 2004 the aorta was about 4.1 cm at that time.  In 2016 it was around 4.3 cm. ? ?Impression: ?[name], [name]. is a 76 year old man with a past medical history significant for an ascending aortic aneurysm, chronic atrial fibrillation, myasthenia gravis,  hypertension, hyperlipidemia, pericarditis, reflux, and Bell's palsy.   ? ?Ascending aneurysm-stable at about 4.5 cm.  No indication for surgery.  Needs continued semiannual follow-up. ? ?Hypertension-blood pressure elevated in the office today.  He monitors himself multiple times a day at home and his blood pressure has been well controlled.  He will continue with a home monitoring. ? ?Atrial fibrillation-managed by Dr. Katrinka Blazing. ? ?Myasthenia gravis-symptoms stable. ? ?Plan: ?Continue to monitor blood pressure ?Return in 6 months with CT angiogram of chest ? ?Loreli Slot, MD ?[facility] ?[phone] ? ? ? ?

## 2021-04-25 ENCOUNTER — Telehealth: Payer: Self-pay | Admitting: Interventional Cardiology

## 2021-04-25 NOTE — Telephone Encounter (Signed)
Pt c/o medication issue: ? ?1. Name of Medication: ELIQUIS 5 MG TABS tablet ? ?2. How are you currently taking this medication (dosage and times per day)? 1 tablet twice a day ? ?3. Are you having a reaction (difficulty breathing--STAT)? no ? ?4. What is your medication issue? Patient states he had a cut/break in his skin last night and could not stop the bleeding. He says it was not pulsing, but it was not slowing down or stopping. He says he has not taken the eliquis this morning and the bleeding seems to be under control now. He would like to know if he can hold the eliquis and if he can hold the eliquis when this happens.   ?

## 2021-04-25 NOTE — Telephone Encounter (Signed)
Spoke with patient regarding holding his Eliquis. Advised patient that it is not recommended to hold Eliquis due to risk of clots, and taking the Eliquis keeps him at a therapeutic level. Patient states the bleeding has slowed down and appears to stop unless the area is irritated. Reminded patient that taking Eliquis puts him at increased risk of bleeding and to be careful when using sharp objects, taking extra care to protect the skin from cuts and scratches. Also advised patient to hold pressure to area for 5-10 minutes to help stop bleeding. Patient verbalized understanding. ?

## 2021-05-16 DIAGNOSIS — N401 Enlarged prostate with lower urinary tract symptoms: Secondary | ICD-10-CM | POA: Diagnosis not present

## 2021-05-16 DIAGNOSIS — N281 Cyst of kidney, acquired: Secondary | ICD-10-CM | POA: Diagnosis not present

## 2021-05-16 DIAGNOSIS — N200 Calculus of kidney: Secondary | ICD-10-CM | POA: Diagnosis not present

## 2021-05-16 DIAGNOSIS — R350 Frequency of micturition: Secondary | ICD-10-CM | POA: Diagnosis not present

## 2021-05-25 DIAGNOSIS — H18413 Arcus senilis, bilateral: Secondary | ICD-10-CM | POA: Diagnosis not present

## 2021-05-25 DIAGNOSIS — H353222 Exudative age-related macular degeneration, left eye, with inactive choroidal neovascularization: Secondary | ICD-10-CM | POA: Diagnosis not present

## 2021-05-25 DIAGNOSIS — Z961 Presence of intraocular lens: Secondary | ICD-10-CM | POA: Diagnosis not present

## 2021-05-25 DIAGNOSIS — H353111 Nonexudative age-related macular degeneration, right eye, early dry stage: Secondary | ICD-10-CM | POA: Diagnosis not present

## 2021-05-25 DIAGNOSIS — H43813 Vitreous degeneration, bilateral: Secondary | ICD-10-CM | POA: Diagnosis not present

## 2021-05-25 DIAGNOSIS — H26491 Other secondary cataract, right eye: Secondary | ICD-10-CM | POA: Diagnosis not present

## 2021-05-25 DIAGNOSIS — H26493 Other secondary cataract, bilateral: Secondary | ICD-10-CM | POA: Diagnosis not present

## 2021-05-30 DIAGNOSIS — E559 Vitamin D deficiency, unspecified: Secondary | ICD-10-CM | POA: Diagnosis not present

## 2021-05-30 DIAGNOSIS — I10 Essential (primary) hypertension: Secondary | ICD-10-CM | POA: Diagnosis not present

## 2021-05-30 DIAGNOSIS — Z1389 Encounter for screening for other disorder: Secondary | ICD-10-CM | POA: Diagnosis not present

## 2021-05-30 DIAGNOSIS — I2510 Atherosclerotic heart disease of native coronary artery without angina pectoris: Secondary | ICD-10-CM | POA: Diagnosis not present

## 2021-05-30 DIAGNOSIS — I4819 Other persistent atrial fibrillation: Secondary | ICD-10-CM | POA: Diagnosis not present

## 2021-05-30 DIAGNOSIS — E782 Mixed hyperlipidemia: Secondary | ICD-10-CM | POA: Diagnosis not present

## 2021-05-30 DIAGNOSIS — Z125 Encounter for screening for malignant neoplasm of prostate: Secondary | ICD-10-CM | POA: Diagnosis not present

## 2021-05-30 DIAGNOSIS — I7121 Aneurysm of the ascending aorta, without rupture: Secondary | ICD-10-CM | POA: Diagnosis not present

## 2021-05-30 DIAGNOSIS — Z0000 Encounter for general adult medical examination without abnormal findings: Secondary | ICD-10-CM | POA: Diagnosis not present

## 2021-05-30 DIAGNOSIS — G7000 Myasthenia gravis without (acute) exacerbation: Secondary | ICD-10-CM | POA: Diagnosis not present

## 2021-06-08 DIAGNOSIS — H26492 Other secondary cataract, left eye: Secondary | ICD-10-CM | POA: Diagnosis not present

## 2021-06-12 ENCOUNTER — Encounter: Payer: Self-pay | Admitting: Neurology
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
## 2021-06-14 ENCOUNTER — Ambulatory Visit: Payer: Medicare PPO | Admitting: Neurology

## 2021-06-14 VITALS — BP 138/80 | HR 80 | Ht 67.0 in | Wt 200.0 lb

## 2021-06-14 DIAGNOSIS — G7000 Myasthenia gravis without (acute) exacerbation: Secondary | ICD-10-CM | POA: Diagnosis not present

## 2021-06-14 MED ORDER — MYCOPHENOLATE MOFETIL 250 MG PO CAPS: ORAL_CAPSULE | ORAL | 1 refills | Status: DC

## 2021-06-14 MED ORDER — PYRIDOSTIGMINE BROMIDE 60 MG PO TABS: 60.0000 mg | ORAL_TABLET | Freq: Three times a day (TID) | ORAL | 11 refills | Status: DC | PRN

## 2021-06-14 NOTE — Patient Instructions (Signed)
Keep current dose of Cellcept, take Mestinon as needed ?Check labs today ?Monitor for symptoms ?See you back in 6 months ?

## 2021-06-21 ENCOUNTER — Encounter (INDEPENDENT_AMBULATORY_CARE_PROVIDER_SITE_OTHER): Payer: Medicare PPO | Admitting: Ophthalmology

## 2021-06-21 DIAGNOSIS — H35033 Hypertensive retinopathy, bilateral: Secondary | ICD-10-CM | POA: Diagnosis not present

## 2021-06-21 DIAGNOSIS — H43813 Vitreous degeneration, bilateral: Secondary | ICD-10-CM

## 2021-06-21 DIAGNOSIS — H353112 Nonexudative age-related macular degeneration, right eye, intermediate dry stage: Secondary | ICD-10-CM | POA: Diagnosis not present

## 2021-06-21 DIAGNOSIS — H353221 Exudative age-related macular degeneration, left eye, with active choroidal neovascularization: Secondary | ICD-10-CM | POA: Diagnosis not present

## 2021-06-21 DIAGNOSIS — I10 Essential (primary) hypertension: Secondary | ICD-10-CM

## 2021-07-26 ENCOUNTER — Other Ambulatory Visit: Payer: Self-pay | Admitting: Interventional Cardiology

## 2021-07-26 ENCOUNTER — Other Ambulatory Visit: Payer: Self-pay

## 2021-07-26 DIAGNOSIS — I482 Chronic atrial fibrillation, unspecified: Secondary | ICD-10-CM

## 2021-07-26 MED ORDER — DIGOXIN 250 MCG PO TABS: ORAL_TABLET | ORAL | 0 refills | Status: DC

## 2021-07-26 NOTE — Telephone Encounter (Signed)
Eliquis 5mg  refill request received. Patient is 76 years old, weight-90.7kg, Crea-0.78 on 08/22/2020, Diagnosis-Afib, and last seen by Dr. 10/23/2020 on 08/31/2020 and pending an appt on 09/14/2021. Dose is appropriate based on dosing criteria. Will send in refill to requested pharmacy.

## 2021-08-11 ENCOUNTER — Telehealth: Payer: Self-pay | Admitting: *Deleted

## 2021-08-11 NOTE — Telephone Encounter (Signed)
   Pre-operative Risk Assessment    Patient Name: [name].  DOB: 02-03-1946 MRN: [id number]      Request for Surgical Clearance    Procedure:   COLONSCOPY  Date of Surgery:  Clearance TBD                                 Surgeon:  Simeon Craft, PA Surgeon's Group or Practice Name:  [facility] Phone number:  [phone] Fax number:  [phone]   Type of Clearance Requested:   - Pharmacy:  Hold Apixaban (Eliquis) X'S 2   Type of Anesthesia:  Not Indicated   Additional requests/questions:    Wilhemina Cash   08/11/2021, 10:40 AM

## 2021-08-16 NOTE — Telephone Encounter (Signed)
Patient with diagnosis of afib on Eliquis for anticoagulation.    Procedure: colonoscopy Date of procedure: TBD  CHA2DS2-VASc Score = 4  This indicates a 4.8% annual risk of stroke. The patient's score is based upon: CHF History: 0 HTN History: 1 Diabetes History: 0 Stroke History: 0 Vascular Disease History: 1 Age Score: 2 Gender Score: 0   CrCl 53mL/min using adjusted body weight Platelet count 243K  Per office protocol, patient can hold Eliquis for 2 days prior to procedure as requested.    **This guidance is not considered finalized until pre-operative APP has relayed final recommendations.**

## 2021-08-16 NOTE — Telephone Encounter (Signed)
   Patient Name: [name].  DOB: 12/02/1945 MRN: [id number]  Primary Cardiologist: Lesleigh Noe, MD  Clinical pharmacists have reviewed the patient's past medical history, labs and current medications as part of pre-operative protocol coverage.   The following recommendations have been made:  Patient with diagnosis of afib on Eliquis for anticoagulation.     Procedure: colonoscopy Date of procedure: TBD   CHA2DS2-VASc Score = 4  This indicates a 4.8% annual risk of stroke. The patient's score is based upon: CHF History: 0 HTN History: 1 Diabetes History: 0 Stroke History: 0 Vascular Disease History: 1 Age Score: 2 Gender Score: 0   CrCl 72mL/min using adjusted body weight Platelet count 243K   Per office protocol, patient can hold Eliquis for 2 days prior to procedure as requested.  Please resume Eliquis as soon as possible postprocedure, at the discretion of the surgeon.  I will route this recommendation to the requesting party via Epic fax function and remove from the pre-op pool.   Please call with any questions.   Joylene Grapes, NP 08/16/2021, 2:23 PM

## 2021-09-05 DIAGNOSIS — E785 Hyperlipidemia, unspecified: Secondary | ICD-10-CM | POA: Diagnosis not present

## 2021-09-05 DIAGNOSIS — E669 Obesity, unspecified: Secondary | ICD-10-CM | POA: Diagnosis not present

## 2021-09-05 DIAGNOSIS — I4891 Unspecified atrial fibrillation: Secondary | ICD-10-CM | POA: Diagnosis not present

## 2021-09-05 DIAGNOSIS — I10 Essential (primary) hypertension: Secondary | ICD-10-CM | POA: Diagnosis not present

## 2021-09-05 DIAGNOSIS — N3281 Overactive bladder: Secondary | ICD-10-CM | POA: Diagnosis not present

## 2021-09-05 DIAGNOSIS — N400 Enlarged prostate without lower urinary tract symptoms: Secondary | ICD-10-CM | POA: Diagnosis not present

## 2021-09-05 DIAGNOSIS — G7000 Myasthenia gravis without (acute) exacerbation: Secondary | ICD-10-CM | POA: Diagnosis not present

## 2021-09-05 DIAGNOSIS — I2510 Atherosclerotic heart disease of native coronary artery without angina pectoris: Secondary | ICD-10-CM | POA: Diagnosis not present

## 2021-09-05 DIAGNOSIS — D6869 Other thrombophilia: Secondary | ICD-10-CM | POA: Diagnosis not present

## 2021-09-13 NOTE — Progress Notes (Signed)
Cardiology Office Note:    Date:  09/14/2021   ID:  [name]., DOB 07/30/1945, MRN [id number]  PCP:  [name], MD  Cardiologist:  Lesleigh Noe, MD   Referring MD: [name], MD   Chief Complaint  Patient presents with   Atrial Fibrillation   Thoracic Aortic Aneurysm    History of Present Illness:    [name]. is a 77 y.o. male with a hx of permanent AF, chronic anticoagulation, ascending aortic aneurysm 4.8 cm June 2022 CT essential hypertension, hyperlipidemia, masthenia gravis and nonobstructive CAD by cath 2002 (LAD luminal irregularities).   He is doing well.  He denies angina.  There is no lower extremity edema.  He does not have orthopnea or dyspnea on exertion.  No episodes of syncope or palpitation.  No medication side effects.  He denies syncope but has had some relatively low blood pressures but not associated with dizziness.  He has gotten systolic pressures in the mid 401 mmHg range at times  Past Medical History:  Diagnosis Date   A-fib (HCC)    Dysrhythmia    GERD (gastroesophageal reflux disease)    History of Bell's palsy    History of pericarditis    Hypertension    Mixed hyperlipidemia    Myasthenia gravis (HCC)    currently in remission (11/2014)     Past Surgical History:  Procedure Laterality Date   CARDIOVERSION N/A 09/21/2016   Procedure: CARDIOVERSION;  Surgeon: Thurmon Fair, MD;  Location: [facility] ENDOSCOPY;  Service: Cardiovascular;  Laterality: N/A;   CARDIOVERSION N/A 10/17/2016   Procedure: CARDIOVERSION;  Surgeon: Laurey Morale, MD;  Location: [facility] ENDOSCOPY;  Service: Cardiovascular;  Laterality: N/A;   CARDIOVERSION N/A 10/31/2016   Procedure: CARDIOVERSION;  Surgeon: Wendall Stade, MD;  Location: [facility] ENDOSCOPY;  Service: Cardiovascular;  Laterality: N/A;   LAMINECTOMY  1991   LAPAROSCOPIC APPENDECTOMY N/A 09/10/2019   Procedure: APPENDECTOMY LAPAROSCOPIC;  Surgeon: Luretha Murphy, MD;  Location:  [facility] ORS;  Service: General;  Laterality: N/A;   TEE WITHOUT CARDIOVERSION N/A 09/21/2016   Procedure: TRANSESOPHAGEAL ECHOCARDIOGRAM (TEE);  Surgeon: Thurmon Fair, MD;  Location: [facility] ENDOSCOPY;  Service: Cardiovascular;  Laterality: N/A;   TEE WITHOUT CARDIOVERSION N/A 10/31/2016   Procedure: TRANSESOPHAGEAL ECHOCARDIOGRAM (TEE);  Surgeon: Wendall Stade, MD;  Location: [facility] ENDOSCOPY;  Service: Cardiovascular;  Laterality: N/A;   TONSILLECTOMY  1950    Current Medications: Current Meds  Medication Sig   Alpha-Lipoic Acid 300 MG CAPS Take 300 mg by mouth daily.    atorvastatin (LIPITOR) 20 MG tablet TAKE 1 TABLET BY MOUTH DAILY   cholecalciferol (VITAMIN D3) 25 MCG (1000 UNIT) tablet Take 1,000 Units by mouth daily.   Coenzyme Q10 (COQ-10) 100 MG CAPS Take 100 mg by mouth daily.    digoxin (LANOXIN) 0.25 MG tablet TAKE 1 TABLET(0.25 MG) BY MOUTH DAILY   ELIQUIS 5 MG TABS tablet TAKE 1 TABLET BY MOUTH TWICE DAILY   GEMTESA 75 MG TABS Take by mouth.   Multiple Vitamins-Minerals (CENTRUM SILVER PO) Take 1 tablet by mouth daily.   mycophenolate (CELLCEPT) 250 MG capsule Take 500 mg by mouth every morning and 250 mg by mouth every evening.   Omega-3 Fatty Acids (OMEGA-3 PO) Take 1,040 mg by mouth daily.   pyridostigmine (MESTINON) 60 MG tablet Take 1 tablet (60 mg total) by mouth 3 (three) times daily as needed.   ramipril (ALTACE) 5 MG capsule Take 5 mg by mouth daily.  Resveratrol 100 MG CAPS Take 100 mg by mouth 2 (two) times daily.    tamsulosin (FLOMAX) 0.4 MG CAPS capsule Take 0.4 mg by mouth in the morning and at bedtime.   triamterene-hydrochlorothiazide (MAXZIDE-25) 37.5-25 MG tablet Take 1 tablet by mouth daily.   TURMERIC PO Take 100 mg by mouth 2 (two) times daily.     Allergies:   Demerol [meperidine], Aminoglycosides, Beta adrenergic blockers, Calcium channel blockers, Ciprofloxacin, Iodinated contrast media, Penicillamine, Procainamide hcl, Quinine derivatives, Succinylcholine  chloride, and Vecuronium bromide [vecuronium]   Social History   Socioeconomic History   Marital status: Married    Spouse name: Not on file   Number of children: 1   Years of education: Grad Sch   Highest education level: Not on file  Occupational History   Occupation: Pharmacologist  Tobacco Use   Smoking status: Former    Types: Pipe    Quit date: 12/10/1966    Years since quitting: 54.8   Smokeless tobacco: Never  Vaping Use   Vaping Use: Never used  Substance and Sexual Activity   Alcohol use: Yes    Alcohol/week: 1.0 standard drink of alcohol    Types: 1 Glasses of wine per week   Drug use: No   Sexual activity: Not on file  Other Topics Concern   Not on file  Social History Narrative   Lives at home with his wife.   Right-handed.   Occasional caffeine use.   Social Determinants of Health   Financial Resource Strain: Not on file  Food Insecurity: Not on file  Transportation Needs: Not on file  Physical Activity: Not on file  Stress: Not on file  Social Connections: Not on file     Family History: The patient's family history includes Cancer in his brother and mother; Healthy in his daughter; Heart disease in his father.  ROS:   Please see the history of present illness.    Myasthenia gravis has been somewhat of an issue but improving.  All other systems reviewed and are negative.  EKGs/Labs/Other Studies Reviewed:    The following studies were reviewed today:  CT scan of chest 04/10/2021: IMPRESSION: Stable ascending thoracic aortic aneurysm remeasured at 43 mm.   Recommend annual imaging followup by CTA or MRA. This recommendation follows 2010 ACCF/AHA/AATS/ACR/ASA/SCA/SCAI/SIR/STS/SVM Guidelines for the Diagnosis and Management of Patients with Thoracic Aortic Disease. Circulation. 2010; 121: T732-K025. Aortic aneurysm NOS (ICD10-I71.9)   Native coronary atherosclerosis and aortic atherosclerosis.   No other acute intrathoracic finding by  CTA.   Aortic Atherosclerosis (ICD10-I70.0).   EKG:  EKG A-fib with good rate control, incomplete right bundle.  Recent Labs: No results found for requested labs within last 365 days.  Recent Lipid Panel    Component Value Date/Time   CHOL 144 09/13/2015 1041   CHOL 218 (H) 12/14/2014 0915   TRIG 106 09/14/2019 0350   TRIG 77 12/14/2014 0915   HDL 56 09/13/2015 1041   HDL 52 12/14/2014 0915   CHOLHDL 2.6 09/13/2015 1041   VLDL 12 09/13/2015 1041   LDLCALC 76 09/13/2015 1041   LDLCALC 151 (H) 12/14/2014 0915    Physical Exam:    VS:  BP 108/70   Pulse 65   Ht 5\' 7"  (1.702 m)   Wt 199 lb 9.6 oz (90.5 kg)   SpO2 95%   BMI 31.26 kg/m     Wt Readings from Last 3 Encounters:  09/14/21 199 lb 9.6 oz (90.5 kg)  06/14/21 200 lb (  90.7 kg)  04/18/21 195 lb 6.4 oz (88.6 kg)     GEN: Unchanged in appearance and feels comfortable.. No acute distress HEENT: Normal NECK: No JVD. LYMPHATICS: No lymphadenopathy CARDIAC: No murmur. IIRR no gallop, or edema. VASCULAR:  Normal Pulses. No bruits. RESPIRATORY:  Clear to auscultation without rales, wheezing or rhonchi  ABDOMEN: Soft, non-tender, non-distended, No pulsatile mass, MUSCULOSKELETAL: No deformity  SKIN: Warm and dry NEUROLOGIC:  Alert and oriented x 3 PSYCHIATRIC:  Normal affect   ASSESSMENT:    1. Chronic atrial fibrillation (HCC)   2. Aneurysm of ascending aorta without rupture (HCC)   3. Mixed hyperlipidemia   4. Essential hypertension   5. High coronary artery calcium score   6. Chronic anticoagulation    PLAN:    In order of problems listed above:  Rate control and no symptoms.  Continue same therapy which includes Lanoxin 0.25 mg/day.  Needs dig level.  Unable to use beta-blocker therapy for rate control because of myasthenia gravis. Continue monitoring.  4.2 cm in diameter when last checked.  Being followed jointly with Dr. Dorris Fetch. Continue Lipitor 20 mg/day. Continue Altace 5 mg/day. Continue  aggressive lipid-lowering Continue Eliquis but monitor for bleeding.  Establish with new cardiologist on return in 1 year.   Medication Adjustments/Labs and Tests Ordered: Current medicines are reviewed at length with the patient today.  Concerns regarding medicines are outlined above.  Orders Placed This Encounter  Procedures   EKG 12-Lead   No orders of the defined types were placed in this encounter.   Patient Instructions  Medication Instructions:  Your physician recommends that you continue on your current medications as directed. Please refer to the Current Medication list given to you today.  *If you need a refill on your cardiac medications before your next appointment, please call your pharmacy*  Lab Work: NONE  Testing/Procedures: NONE  Follow-Up: At [facility], you and your health needs are our priority.  As part of our continuing mission to provide you with exceptional heart care, we have created designated Provider Care Teams.  These Care Teams include your primary Cardiologist (physician) and Advanced Practice Providers (APPs -  Physician Assistants and Nurse Practitioners) who all work together to provide you with the care you need, when you need it.  Your next appointment:   1 year(s)  The format for your next appointment:   In Person  Provider:   Lesleigh Noe, MD {   Important Information About Sugar         Signed, Lesleigh Noe, MD  09/14/2021 2:55 PM    [facility]

## 2021-09-14 ENCOUNTER — Ambulatory Visit: Payer: Medicare PPO | Admitting: Interventional Cardiology

## 2021-09-14 ENCOUNTER — Encounter: Payer: Self-pay | Admitting: Interventional Cardiology

## 2021-09-14 VITALS — BP 108/70 | HR 65 | Ht 67.0 in | Wt 199.6 lb

## 2021-09-14 DIAGNOSIS — I482 Chronic atrial fibrillation, unspecified: Secondary | ICD-10-CM

## 2021-09-14 DIAGNOSIS — I7121 Aneurysm of the ascending aorta, without rupture: Secondary | ICD-10-CM | POA: Diagnosis not present

## 2021-09-14 DIAGNOSIS — E782 Mixed hyperlipidemia: Secondary | ICD-10-CM

## 2021-09-14 DIAGNOSIS — Z7901 Long term (current) use of anticoagulants: Secondary | ICD-10-CM

## 2021-09-14 DIAGNOSIS — R931 Abnormal findings on diagnostic imaging of heart and coronary circulation: Secondary | ICD-10-CM

## 2021-09-14 DIAGNOSIS — I10 Essential (primary) hypertension: Secondary | ICD-10-CM | POA: Diagnosis not present

## 2021-09-14 NOTE — Patient Instructions (Signed)

## 2021-09-15 ENCOUNTER — Other Ambulatory Visit: Payer: Self-pay | Admitting: *Deleted

## 2021-09-15 DIAGNOSIS — I7121 Aneurysm of the ascending aorta, without rupture: Secondary | ICD-10-CM

## 2021-09-26 NOTE — Telephone Encounter (Signed)
   Patient Name: [name].  DOB: [birth year] MRN: [id number]  Primary Cardiologist: Lesleigh Noe, MD  Chart reviewed as part of pre-operative protocol coverage. Given past medical history and time since last visit, based on ACC/AHA guidelines, [name]. would be at acceptable risk for the planned procedure without further cardiovascular testing.   The patient was advised that if he develops new symptoms prior to surgery to contact our office to arrange for a follow-up visit, and he verbalized understanding. Patient with diagnosis of afib on Eliquis for anticoagulation.     Procedure: colonoscopy Date of procedure: TBD   CHA2DS2-VASc Score = 4  This indicates a 4.8% annual risk of stroke. The patient's score is based upon: CHF History: 0 HTN History: 1 Diabetes History: 0 Stroke History: 0 Vascular Disease History: 1 Age Score: 2 Gender Score: 0   CrCl 73mL/min using adjusted body weight Platelet count 243K   Per office protocol, patient can hold Eliquis for 2 days prior to procedure as requested.  Please resume Eliquis as soon as possible postprocedure, at the discretion of the surgeon.    I will route this recommendation to the requesting party via Epic fax function and remove from pre-op pool.  Please call with questions.  Joylene Grapes, NP 09/26/2021, 1:29 PM

## 2021-10-04 ENCOUNTER — Other Ambulatory Visit: Payer: Self-pay | Admitting: Neurology

## 2021-10-10 DIAGNOSIS — D126 Benign neoplasm of colon, unspecified: Secondary | ICD-10-CM | POA: Diagnosis not present

## 2021-10-10 DIAGNOSIS — Z09 Encounter for follow-up examination after completed treatment for conditions other than malignant neoplasm: Secondary | ICD-10-CM | POA: Diagnosis not present

## 2021-10-10 DIAGNOSIS — D122 Benign neoplasm of ascending colon: Secondary | ICD-10-CM | POA: Diagnosis not present

## 2021-10-10 DIAGNOSIS — D120 Benign neoplasm of cecum: Secondary | ICD-10-CM | POA: Diagnosis not present

## 2021-10-10 DIAGNOSIS — Z86010 Personal history of colonic polyps: Secondary | ICD-10-CM | POA: Diagnosis not present

## 2021-10-10 DIAGNOSIS — D123 Benign neoplasm of transverse colon: Secondary | ICD-10-CM | POA: Diagnosis not present

## 2021-10-18 ENCOUNTER — Encounter (INDEPENDENT_AMBULATORY_CARE_PROVIDER_SITE_OTHER): Payer: Medicare PPO | Admitting: Ophthalmology

## 2021-10-18 DIAGNOSIS — H35033 Hypertensive retinopathy, bilateral: Secondary | ICD-10-CM | POA: Diagnosis not present

## 2021-10-18 DIAGNOSIS — H353111 Nonexudative age-related macular degeneration, right eye, early dry stage: Secondary | ICD-10-CM

## 2021-10-18 DIAGNOSIS — I10 Essential (primary) hypertension: Secondary | ICD-10-CM

## 2021-10-18 DIAGNOSIS — H353221 Exudative age-related macular degeneration, left eye, with active choroidal neovascularization: Secondary | ICD-10-CM | POA: Diagnosis not present

## 2021-10-18 DIAGNOSIS — H43813 Vitreous degeneration, bilateral: Secondary | ICD-10-CM

## 2021-10-19 ENCOUNTER — Ambulatory Visit
Admission: RE | Admit: 2021-10-19 | Discharge: 2021-10-19 | Disposition: A | Discharge status: Home / Self Care | Payer: Medicare PPO | Source: Ambulatory Visit | Attending: Thoracic Surgery (Cardiothoracic Vascular Surgery) | Admitting: Thoracic Surgery (Cardiothoracic Vascular Surgery)

## 2021-10-19 DIAGNOSIS — I7121 Aneurysm of the ascending aorta, without rupture: Secondary | ICD-10-CM | POA: Diagnosis not present

## 2021-10-19 DIAGNOSIS — I700 Atherosclerosis of aorta: Secondary | ICD-10-CM | POA: Diagnosis not present

## 2021-10-24 ENCOUNTER — Ambulatory Visit: Payer: Medicare PPO | Admitting: Physician Assistant

## 2021-10-24 VITALS — BP 123/87 | HR 68 | Resp 18 | Ht 67.0 in | Wt 195.0 lb

## 2021-10-24 DIAGNOSIS — I7121 Aneurysm of the ascending aorta, without rupture: Secondary | ICD-10-CM

## 2021-10-24 NOTE — Progress Notes (Signed)
[address]       [name] [address]             [phone]       HPI: [name] is a 76 year old gentleman with a past history of dyslipidemia, chronic atrial fibrillation, hypertension, myasthenia gravis, pericarditis, and gastroesophageal reflux disease.  He was discovered to have an ascending aortic aneurysm several years ago and has been followed periodically by our office for surveillance.  He was last seen by Dr. Dorris Fetch on 04/10/2021.  At that time, CT scan showed ascending aortic aneurysm measuring 4.5 cm which was consistent with Dr. Sunday Corn measurements 6 months prior.  The aneurysm measured 4.1 cm in 2016. CT angiogram was performed on 10/19/2021 showing relative stability of the ascending aortic aneurysm.  By the radiologist measure, it is now 4.4 cm compared with 4.3 cm 6 months ago. An echocardiogram done in 2020 showed normal aortic valve structure and function. Since his visit about 6 months ago with Dr. Orson Aloe, Mr. Lacretia Nicks reports no new health issues.  He said he continues to be most limited by his myasthenia gravis.  He denies having any chest pain or shortness of breath.   Current Outpatient Medications  Medication Sig Dispense Refill   Alpha-Lipoic Acid 300 MG CAPS Take 300 mg by mouth daily.      atorvastatin (LIPITOR) 20 MG tablet TAKE 1 TABLET BY MOUTH DAILY 30 tablet 11   cholecalciferol (VITAMIN D3) 25 MCG (1000 UNIT) tablet Take 1,000 Units by mouth daily.     Coenzyme Q10 (COQ-10) 100 MG CAPS Take 100 mg by mouth daily.      digoxin (LANOXIN) 0.25 MG tablet TAKE 1 TABLET(0.25 MG) BY MOUTH DAILY 90 tablet 0   ELIQUIS 5 MG TABS tablet TAKE 1 TABLET BY MOUTH TWICE DAILY 60 tablet 5   GEMTESA 75 MG TABS Take by mouth.     Multiple Vitamins-Minerals (CENTRUM SILVER PO) Take 1 tablet by mouth daily.     mycophenolate (CELLCEPT) 250 MG capsule TAKE 1 CAPSULE(250 MG) BY MOUTH TWICE DAILY 180 capsule 2   Omega-3 Fatty Acids  (OMEGA-3 PO) Take 1,040 mg by mouth daily.     pyridostigmine (MESTINON) 60 MG tablet Take 1 tablet (60 mg total) by mouth 3 (three) times daily as needed. 90 tablet 11   ramipril (ALTACE) 5 MG capsule Take 5 mg by mouth daily.     Resveratrol 100 MG CAPS Take 100 mg by mouth 2 (two) times daily.      tamsulosin (FLOMAX) 0.4 MG CAPS capsule Take 0.4 mg by mouth in the morning and at bedtime.     triamterene-hydrochlorothiazide (MAXZIDE-25) 37.5-25 MG tablet Take 1 tablet by mouth daily.     TURMERIC PO Take 100 mg by mouth 2 (two) times daily.     No current facility-administered medications for this visit.    Physical Exam Vital signs BP 123/87 Pulse 68 Respirations 18 SPO2 95% on room air  Heart: Irregularly irregular rhythm consistent with his chronic A-fib Chest: Respirations are not labored Extremities: Well perfused without edema   Diagnostic Tests:  CLINICAL DATA:  Aortic aneurysm, known or suspected. Six month follow-up.   EXAM: CT CHEST WITHOUT CONTRAST   TECHNIQUE: Multidetector CT imaging of the chest was performed following the standard protocol without IV contrast.   RADIATION DOSE REDUCTION: This exam was performed according to the departmental dose-optimization program which includes automated exposure control, adjustment of the mA  and/or kV according to patient size and/or use of iterative reconstruction technique.   COMPARISON:  04/10/2021   FINDINGS: Cardiovascular: Mild aneurysmal dilatation of the ascending thoracic aorta, 4.4 cm compared to 4.3 cm previously, not significantly changed. Heart is normal size. Diffuse coronary artery calcifications in the low left coronary arteries. Scattered right coronary artery calcifications. Scattered aortic calcifications.   Mediastinum/Nodes: No mediastinal, hilar, or axillary adenopathy. Trachea and esophagus are unremarkable. Thyroid unremarkable.   Lungs/Pleura: Scattered punctate calcified granulomas in  the lungs. No confluent airspace opacities or effusions.   Upper Abdomen: Imaging into the upper abdomen demonstrates no acute findings.   Musculoskeletal: Chest wall soft tissues are unremarkable. No acute bony abnormality.   IMPRESSION: 4.4 cm ascending thoracic aortic aneurysm, not significantly changed since prior study. Recommend annual imaging followup by CTA or MRA. This recommendation follows 2010 ACCF/AHA/AATS/ACR/ASA/SCA/SCAI/SIR/STS/SVM Guidelines for the Diagnosis and Management of Patients with Thoracic Aortic Disease. Circulation. 2010; 121: A416-S063. Aortic aneurysm NOS (ICD10-I71.9)   Coronary artery disease.   No acute cardiopulmonary disease.   Aortic Atherosclerosis (ICD10-I70.0).   Impression / Plan:  Stable ascending thoracic aortic aneurysm.  Blood pressure is well controlled.  He understands and agrees with the plan for continued surveillance. Plan follow-up in 6 months with CTA chest   Leary Roca, PA-C [facility] [phone]

## 2021-10-24 NOTE — Patient Instructions (Signed)
Avoid strenuous activity with no lifting greater than 30 pounds.  Continue careful control and monitoring of your blood pressure as you are already doing  Follow-up in 6 months with repeat CTA

## 2021-11-16 DIAGNOSIS — R051 Acute cough: Secondary | ICD-10-CM | POA: Diagnosis not present

## 2021-11-20 DIAGNOSIS — J069 Acute upper respiratory infection, unspecified: Secondary | ICD-10-CM | POA: Diagnosis not present

## 2021-11-24 DIAGNOSIS — N281 Cyst of kidney, acquired: Secondary | ICD-10-CM | POA: Diagnosis not present

## 2021-11-24 DIAGNOSIS — R350 Frequency of micturition: Secondary | ICD-10-CM | POA: Diagnosis not present

## 2021-11-24 DIAGNOSIS — N401 Enlarged prostate with lower urinary tract symptoms: Secondary | ICD-10-CM | POA: Diagnosis not present

## 2021-11-30 DIAGNOSIS — I4819 Other persistent atrial fibrillation: Secondary | ICD-10-CM | POA: Diagnosis not present

## 2021-11-30 DIAGNOSIS — G7000 Myasthenia gravis without (acute) exacerbation: Secondary | ICD-10-CM | POA: Diagnosis not present

## 2021-11-30 DIAGNOSIS — E782 Mixed hyperlipidemia: Secondary | ICD-10-CM | POA: Diagnosis not present

## 2021-11-30 DIAGNOSIS — I2510 Atherosclerotic heart disease of native coronary artery without angina pectoris: Secondary | ICD-10-CM | POA: Diagnosis not present

## 2021-11-30 DIAGNOSIS — I10 Essential (primary) hypertension: Secondary | ICD-10-CM | POA: Diagnosis not present

## 2021-12-05 ENCOUNTER — Other Ambulatory Visit: Payer: Self-pay

## 2021-12-05 MED ORDER — DIGOXIN 250 MCG PO TABS: ORAL_TABLET | ORAL | 3 refills | Status: DC
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
## 2021-12-21 ENCOUNTER — Encounter: Payer: Self-pay | Admitting: Neurology

## 2021-12-21 ENCOUNTER — Ambulatory Visit: Payer: Medicare PPO | Admitting: Neurology

## 2021-12-21 VITALS — BP 96/68 | HR 71 | Ht 67.0 in | Wt 190.0 lb

## 2021-12-21 DIAGNOSIS — G7000 Myasthenia gravis without (acute) exacerbation: Secondary | ICD-10-CM

## 2021-12-21 MED ORDER — PYRIDOSTIGMINE BROMIDE 60 MG PO TABS
60.0000 mg | ORAL_TABLET | Freq: Three times a day (TID) | ORAL | 11 refills | Status: DC | PRN
Start: 2021-12-21 — End: 2022-06-21

## 2021-12-21 MED ORDER — MYCOPHENOLATE MOFETIL 250 MG PO CAPS
ORAL_CAPSULE | ORAL | 3 refills | Status: DC
Start: 2021-12-21 — End: 2023-01-03

## 2021-12-21 NOTE — Patient Instructions (Signed)
Great to see you today  For now we will continue the Cellcept and Mestinon  Check labs today   Meds ordered this encounter  Medications   pyridostigmine (MESTINON) 60 MG tablet    Sig: Take 1 tablet (60 mg total) by mouth 3 (three) times daily as needed.    Dispense:  90 tablet    Refill:  11   mycophenolate (CELLCEPT) 250 MG capsule    Sig: Take 2 capsules (500 mg total) by mouth every morning AND 1 capsule (250 mg total) every evening.    Dispense:  270 capsule    Refill:  3

## 2021-12-22 LAB — COMPREHENSIVE METABOLIC PANEL
ALT: 17 IU/L (ref 0–44)
AST: 24 IU/L (ref 0–40)
Albumin/Globulin Ratio: 2 (ref 1.2–2.2)
Albumin: 4.3 g/dL (ref 3.8–4.8)
Alkaline Phosphatase: 85 IU/L (ref 44–121)
BUN/Creatinine Ratio: 23 (ref 10–24)
BUN: 17 mg/dL (ref 8–27)
Bilirubin Total: 0.8 mg/dL (ref 0.0–1.2)
CO2: 24 mmol/L (ref 20–29)
Calcium: 9.4 mg/dL (ref 8.6–10.2)
Chloride: 101 mmol/L (ref 96–106)
Creatinine, Ser: 0.73 mg/dL — ABNORMAL LOW (ref 0.76–1.27)
Globulin, Total: 2.1 g/dL (ref 1.5–4.5)
Glucose: 90 mg/dL (ref 70–99)
Potassium: 4 mmol/L (ref 3.5–5.2)
Sodium: 139 mmol/L (ref 134–144)
Total Protein: 6.4 g/dL (ref 6.0–8.5)
eGFR: 94 mL/min/{1.73_m2} (ref >59)

## 2021-12-22 LAB — CBC WITH DIFFERENTIAL/PLATELET
Basophils Absolute: 0.1 10*3/uL (ref 0.0–0.2)
Basos: 1 %
EOS (ABSOLUTE): 0.1 10*3/uL (ref 0.0–0.4)
Eos: 2 %
Hematocrit: 45.8 % (ref 37.5–51.0)
Hemoglobin: 15.3 g/dL (ref 13.0–17.7)
Immature Grans (Abs): 0 10*3/uL (ref 0.0–0.1)
Immature Granulocytes: 0 %
Lymphocytes Absolute: 0.9 10*3/uL (ref 0.7–3.1)
Lymphs: 12 %
MCH: 31.7 pg (ref 26.6–33.0)
MCHC: 33.4 g/dL (ref 31.5–35.7)
MCV: 95 fL (ref 79–97)
Monocytes Absolute: 0.5 10*3/uL (ref 0.1–0.9)
Monocytes: 7 %
Neutrophils Absolute: 5.7 10*3/uL (ref 1.4–7.0)
Neutrophils: 78 %
Platelets: 175 10*3/uL (ref 150–450)
RBC: 4.82 x10E6/uL (ref 4.14–5.80)
RDW: 12.3 % (ref 11.6–15.4)
WBC: 7.3 10*3/uL (ref 3.4–10.8)

## 2022-01-01 ENCOUNTER — Emergency Department (HOSPITAL_COMMUNITY)
Admission: EM | Admit: 2022-01-01 | Discharge: 2022-01-02 | Disposition: A | Discharge status: Home / Self Care | Payer: Medicare PPO | Attending: Emergency Medicine | Admitting: Emergency Medicine

## 2022-01-01 DIAGNOSIS — S299XXA Unspecified injury of thorax, initial encounter: Secondary | ICD-10-CM | POA: Diagnosis not present

## 2022-01-01 DIAGNOSIS — M545 Low back pain, unspecified: Secondary | ICD-10-CM | POA: Diagnosis not present

## 2022-01-01 DIAGNOSIS — W01190A Fall on same level from slipping, tripping and stumbling with subsequent striking against furniture, initial encounter: Secondary | ICD-10-CM | POA: Insufficient documentation

## 2022-01-01 DIAGNOSIS — R109 Unspecified abdominal pain: Secondary | ICD-10-CM | POA: Diagnosis not present

## 2022-01-01 DIAGNOSIS — N281 Cyst of kidney, acquired: Secondary | ICD-10-CM | POA: Diagnosis not present

## 2022-01-01 DIAGNOSIS — N400 Enlarged prostate without lower urinary tract symptoms: Secondary | ICD-10-CM | POA: Diagnosis not present

## 2022-01-01 DIAGNOSIS — I10 Essential (primary) hypertension: Secondary | ICD-10-CM | POA: Insufficient documentation

## 2022-01-01 DIAGNOSIS — Z87891 Personal history of nicotine dependence: Secondary | ICD-10-CM | POA: Insufficient documentation

## 2022-01-01 DIAGNOSIS — N3289 Other specified disorders of bladder: Secondary | ICD-10-CM | POA: Diagnosis not present

## 2022-01-01 DIAGNOSIS — N200 Calculus of kidney: Secondary | ICD-10-CM | POA: Diagnosis not present

## 2022-01-01 NOTE — ED Provider Triage Note (Signed)
Emergency Medicine Provider Triage Evaluation Note  [name] , a 76 y.o. male  was evaluated in triage.  Pt complains of left flank pain and difficulty urinating.  Pain stabbing in nature, coming in waves.  Hx of kidney stones.  Review of Systems  Positive: Flank pain Negative: fever  Physical Exam  BP 124/73 (BP Location: Left Arm)   Pulse 72   Resp 18   SpO2 96%   Gen:   Awake, no distress   Resp:  Normal effort  MSK:   Moves extremities without difficulty  Other:    Medical Decision Making  Medically screening exam initiated at 11:41 PM.  Appropriate orders placed.  [name]. was informed that the remainder of the evaluation will be completed by another provider, this initial triage assessment does not replace that evaluation, and the importance of remaining in the ED until their evaluation is complete.

## 2022-01-01 NOTE — ED Triage Notes (Signed)
Pt brought to ED with c/o left flank pain that began at 2200 that is exacerbated with movement and urinary retention. Has hx or AAA and kidney stones   EMS Vitals BP 162/86 HR 77 RR 18 SPO2 95% RA CBG 122

## 2022-01-02 ENCOUNTER — Emergency Department (HOSPITAL_COMMUNITY): Payer: Medicare PPO

## 2022-01-02 ENCOUNTER — Other Ambulatory Visit: Payer: Self-pay

## 2022-01-02 ENCOUNTER — Encounter (HOSPITAL_COMMUNITY): Payer: Self-pay | Admitting: Emergency Medicine

## 2022-01-02 DIAGNOSIS — N200 Calculus of kidney: Secondary | ICD-10-CM | POA: Diagnosis not present

## 2022-01-02 DIAGNOSIS — N400 Enlarged prostate without lower urinary tract symptoms: Secondary | ICD-10-CM | POA: Diagnosis not present

## 2022-01-02 DIAGNOSIS — N281 Cyst of kidney, acquired: Secondary | ICD-10-CM | POA: Diagnosis not present

## 2022-01-02 DIAGNOSIS — N3289 Other specified disorders of bladder: Secondary | ICD-10-CM | POA: Diagnosis not present

## 2022-01-02 LAB — URINALYSIS, ROUTINE W REFLEX MICROSCOPIC
Bilirubin Urine: NEGATIVE
Glucose, UA: NEGATIVE mg/dL
Hgb urine dipstick: NEGATIVE
Ketones, ur: NEGATIVE mg/dL
Leukocytes,Ua: NEGATIVE
Nitrite: NEGATIVE
Protein, ur: NEGATIVE mg/dL
Specific Gravity, Urine: 1.015 (ref 1.005–1.030)
pH: 6 (ref 5.0–8.0)

## 2022-01-02 LAB — CBC WITH DIFFERENTIAL/PLATELET
Abs Immature Granulocytes: 0.04 10*3/uL (ref 0.00–0.07)
Basophils Absolute: 0.1 10*3/uL (ref 0.0–0.1)
Basophils Relative: 1 %
Eosinophils Absolute: 0.2 10*3/uL (ref 0.0–0.5)
Eosinophils Relative: 3 %
HCT: 43.9 % (ref 39.0–52.0)
Hemoglobin: 14.6 g/dL (ref 13.0–17.0)
Immature Granulocytes: 0 %
Lymphocytes Relative: 11 %
Lymphs Abs: 1.1 10*3/uL (ref 0.7–4.0)
MCH: 32 pg (ref 26.0–34.0)
MCHC: 33.3 g/dL (ref 30.0–36.0)
MCV: 96.3 fL (ref 80.0–100.0)
Monocytes Absolute: 0.8 10*3/uL (ref 0.1–1.0)
Monocytes Relative: 9 %
Neutro Abs: 7.3 10*3/uL (ref 1.7–7.7)
Neutrophils Relative %: 76 %
Platelets: 157 10*3/uL (ref 150–400)
RBC: 4.56 MIL/uL (ref 4.22–5.81)
RDW: 13.2 % (ref 11.5–15.5)
WBC: 9.5 10*3/uL (ref 4.0–10.5)
nRBC: 0 % (ref 0.0–0.2)

## 2022-01-02 LAB — BASIC METABOLIC PANEL
Anion gap: 13 (ref 5–15)
BUN: 19 mg/dL (ref 8–23)
CO2: 27 mmol/L (ref 22–32)
Calcium: 9.2 mg/dL (ref 8.9–10.3)
Chloride: 99 mmol/L (ref 98–111)
Creatinine, Ser: 0.98 mg/dL (ref 0.61–1.24)
GFR, Estimated: >60 mL/min (ref >60)
Glucose, Bld: 113 mg/dL — ABNORMAL HIGH (ref 70–99)
Potassium: 3.5 mmol/L (ref 3.5–5.1)
Sodium: 139 mmol/L (ref 135–145)

## 2022-01-02 MED ORDER — LIDOCAINE 5 % EX PTCH: 1.0000 | MEDICATED_PATCH | CUTANEOUS | 0 refills | Status: DC

## 2022-01-02 MED ORDER — NAPROXEN 500 MG PO TABS: 500.0000 mg | ORAL_TABLET | Freq: Two times a day (BID) | ORAL | 0 refills | Status: DC

## 2022-01-02 MED ORDER — METHOCARBAMOL 500 MG PO TABS: 500.0000 mg | ORAL_TABLET | Freq: Three times a day (TID) | ORAL | 0 refills | Status: DC | PRN

## 2022-01-02 NOTE — ED Provider Notes (Signed)
[facility] Emergency Department Provider Note MRN:  [id number]  Arrival date & time: 01/02/22     Chief Complaint   Flank Pain   History of Present Illness   [name]. is a 76 y.o. year-old male with a history of A-fib on Eliquis presenting to the ED with chief complaint of flank pain.  Patient developed severe left flank pain this evening.  History of kidney stones.  Denies fever, some urinary hesitancy which is actually more chronic.  Patient explains that he tripped and fell earlier in the afternoon, striking his left ribs on the edge of the couch.  Only had some mild soreness to the area at that time.  Review of Systems  A thorough review of systems was obtained and all systems are negative except as noted in the HPI and PMH.   Patient's Health History    Past Medical History:  Diagnosis Date   A-fib [facility])    Dysrhythmia    GERD (gastroesophageal reflux disease)    History of Bell's palsy    History of pericarditis    Hypertension    Mixed hyperlipidemia    Myasthenia gravis (HCC)    currently in remission (11/2014)     Past Surgical History:  Procedure Laterality Date   CARDIOVERSION N/A 09/21/2016   Procedure: CARDIOVERSION;  Surgeon: Thurmon Fair, MD;  [facility] ENDOSCOPY;  Service: Cardiovascular;  Laterality: N/A;   CARDIOVERSION N/A 10/17/2016   Procedure: CARDIOVERSION;  Surgeon: Laurey Morale, MD;  Location: [facility] ENDOSCOPY;  Service: Cardiovascular;  Laterality: N/A;   CARDIOVERSION N/A 10/31/2016   Procedure: CARDIOVERSION;  Surgeon: Wendall Stade, MD;  Location: [facility] ENDOSCOPY;  Service: Cardiovascular;  Laterality: N/A;   LAMINECTOMY  1991   LAPAROSCOPIC APPENDECTOMY N/A 09/10/2019   Procedure: APPENDECTOMY LAPAROSCOPIC;  Surgeon: Luretha Murphy, MD;  Location: [facility] ORS;  Service: General;  Laterality: N/A;   TEE WITHOUT CARDIOVERSION N/A 09/21/2016   Procedure: TRANSESOPHAGEAL ECHOCARDIOGRAM (TEE);  Surgeon: Thurmon Fair, MD;  Location: [facility] ENDOSCOPY;  Service: Cardiovascular;  Laterality: N/A;   TEE WITHOUT CARDIOVERSION N/A 10/31/2016   Procedure: TRANSESOPHAGEAL ECHOCARDIOGRAM (TEE);  Surgeon: Wendall Stade, MD;  Location: [facility] ENDOSCOPY;  Service: Cardiovascular;  Laterality: N/A;   TONSILLECTOMY  1950    Family History  Problem Relation Age of Onset   Cancer Mother    Heart disease Father    Cancer Brother        bladder/ in remission   Healthy Daughter        age 66    Social History   Socioeconomic History   Marital status: Married    Spouse name: Not on file   Number of children: 1   Years of education: Grad Sch   Highest education level: Not on file  Occupational History   Occupation: Pharmacologist  Tobacco Use   Smoking status: Former    Types: Pipe    Quit date: 12/10/1966    Years since quitting: 55.1   Smokeless tobacco: Never  Vaping Use   Vaping Use: Never used  Substance and Sexual Activity   Alcohol use: Yes    Alcohol/week: 1.0 standard drink of alcohol    Types: 1 Glasses of wine per week   Drug use: No   Sexual activity: Not on file  Other Topics Concern   Not on file  Social History Narrative   Lives at home with his wife.   Right-handed.   Occasional caffeine use.  Social Determinants of Health   Financial Resource Strain: Not on file  Food Insecurity: Not on file  Transportation Needs: Not on file  Physical Activity: Not on file  Stress: Not on file  Social Connections: Not on file  Intimate Partner Violence: Not on file     Physical Exam   Vitals:   01/01/22 2344 01/02/22 0358  BP: 124/73 126/80  Pulse: 72 61  Resp: 18 20  Temp:  97.9 F (36.6 C)  SpO2: 96% 95%    CONSTITUTIONAL: Well-appearing, NAD NEURO/PSYCH:  Alert and oriented x 3, no focal deficits EYES:  eyes equal and reactive ENT/NECK:  no LAD, no JVD CARDIO: Regular rate, well-perfused, normal S1 and S2 PULM:  CTAB no wheezing or rhonchi GI/GU:  non-distended,  non-tender MSK/SPINE:  No gross deformities, no edema SKIN:  no rash, atraumatic   *Additional and/or pertinent findings included in MDM below  Diagnostic and Interventional Summary    EKG Interpretation  Date/Time:    Ventricular Rate:    PR Interval:    QRS Duration:   QT Interval:    QTC Calculation:   R Axis:     Text Interpretation:         Labs Reviewed  BASIC METABOLIC PANEL - Abnormal; Notable for the following components:      Result Value   Glucose, Bld 113 (*)    All other components within normal limits  URINE CULTURE  CBC WITH DIFFERENTIAL/PLATELET  URINALYSIS, ROUTINE W REFLEX MICROSCOPIC    CT Renal Stone Study  Final Result      Medications - No data to display   Procedures  /  Critical Care Procedures  ED Course and Medical Decision Making  Initial Impression and Ddx Differential diagnosis includes kidney stone, pyonephritis, MSK pain.  Given the history of trauma and the fact the patient's pain is much worse with any movement, MSK is favored.  No numbness or weakness to the arms or legs, no bowel or bladder dysfunction, nothing to suggest myelopathy.  Traumatic injury in the setting of anticoagulation is also considered.  Awaiting labs, CT  Past medical/surgical history that increases complexity of ED encounter: A-fib on Eliquis  Interpretation of Diagnostics I personally reviewed the laboratory assessment and my interpretation is as follows: No significant blood count or electrolyte disturbance, CT is without evidence of significant injury or kidney stone, question bladder wall inflammation though urinalysis is normal.    Patient Reassessment and Ultimate Disposition/Management     Workup reassuring, given the results MSK is favored, appropriate for discharge.  Patient management required discussion with the following services or consulting groups:  None  Complexity of Problems Addressed Acute illness or injury that poses threat of life  of bodily function  Additional Data Reviewed and Analyzed Further history obtained from: Further history from spouse/family member  Additional Factors Impacting ED Encounter Risk Prescriptions  Elmer Sow. Pilar Plate, MD [facility] Emergency [facility] [email or web address]  Final Clinical Impressions(s) / ED Diagnoses     ICD-10-CM   1. Flank pain  R10.9       ED Discharge Orders          Ordered     01/02/22 0600    methocarbamol (ROBAXIN) 500 MG tablet  Every 8 hours PRN        01/02/22 0600    lidocaine (LIDODERM) 5 %  Every 24 hours        01/02/22 0601  Discharge Instructions Discussed with and Provided to Patient:    Discharge Instructions      You were evaluated in the Emergency Department and after careful evaluation, we did not find any emergent condition requiring admission or further testing in the hospital.  Your exam/testing today is overall reassuring.  Symptoms seem to be due to muscle strain or spasm.  Recommend using Tylenol 1000 mg every 4-6 hours at home for pain.  Use the numbing patches as well as needed for pain.  For more significant pain can use the Robaxin muscle relaxer, but use caution as this medication can cause drowsiness.  Please return to the Emergency Department if you experience any worsening of your condition.   Thank you for allowing [place] to be a part of your care.      Sabas Sous, MD 01/02/22 [phone]

## 2022-01-02 NOTE — ED Notes (Signed)
ED provider at bedside.

## 2022-01-02 NOTE — ED Notes (Signed)
C/o lt lower back near kidney area per pt since yesterday. C/o frequent urination with small amount for the same time frame but no pain with urination

## 2022-01-02 NOTE — Discharge Instructions (Addendum)
You were evaluated in the Emergency Department and after careful evaluation, we did not find any emergent condition requiring admission or further testing in the hospital.  Your exam/testing today is overall reassuring.  Symptoms seem to be due to muscle strain or spasm.  Recommend using Tylenol 1000 mg every 4-6 hours at home for pain.  Use the numbing patches as well as needed for pain.  For more significant pain can use the Robaxin muscle relaxer, but use caution as this medication can cause drowsiness.  Please return to the Emergency Department if you experience any worsening of your condition.   Thank you for allowing [place] to be a part of your care.

## 2022-01-04 LAB — URINE CULTURE: Culture: <10000 — AB

## 2022-01-09 DIAGNOSIS — L723 Sebaceous cyst: Secondary | ICD-10-CM | POA: Diagnosis not present

## 2022-01-18 ENCOUNTER — Telehealth: Payer: Self-pay

## 2022-01-18 NOTE — Telephone Encounter (Signed)
        Patient  visited [facility] on 01/02/2022  for Unspecified abdominal pain.   Telephone encounter attempt :  1st  A HIPAA compliant voice message was left requesting a return call.  Instructed patient to call back at [phone].   Capitola Ladson [facility]  [facility] Population Health Community Resource Care Guide   [email or web address]  ?? [phone]   Website: [email or web address]  [email or web address]

## 2022-01-19 ENCOUNTER — Telehealth: Payer: Self-pay | Admitting: Interventional Cardiology

## 2022-01-19 NOTE — Telephone Encounter (Signed)
Pt would like a callback regarding other providers due to Dr. Katrinka Blazing retiring. Please advise

## 2022-01-19 NOTE — Telephone Encounter (Signed)
Returned call to patient. With Dr. Katrinka Blazing retiring, patient has decided to establish care with Dr. Izora Ribas. Recall updated for him to see him in July for his yearly.

## 2022-01-22 ENCOUNTER — Telehealth: Payer: Self-pay

## 2022-01-22 NOTE — Telephone Encounter (Signed)
     Patient  visit on 01/02/2022  at [facility] was for back pain.  Have you been able to follow up with your primary care physician? Patient is feeling better did not need to follow up with PCP.   The patient was or was not able to obtain any needed medicine or equipment. Patient was able to obtain medication.  Are there diet recommendations that you are having difficulty following? No diet recommendations given.  Patient expresses understanding of discharge instructions and education provided has no other needs at this time.    [name] [name] [facility]  [facility] Population Health Community Resource Care Guide   [name]  ?? [phone]   Website: [email or web address]  [email or web address]

## 2022-02-14 ENCOUNTER — Other Ambulatory Visit: Payer: Self-pay | Admitting: Interventional Cardiology

## 2022-02-14 DIAGNOSIS — I482 Chronic atrial fibrillation, unspecified: Secondary | ICD-10-CM

## 2022-02-14 NOTE — Telephone Encounter (Signed)
Prescription refill request for Eliquis received. Indication: Afib  Last office visit: 09/14/21 Tamala Julian)  Scr: 0.98 (01/01/22)  Age: 77 Weight: 86.2kg  Appropriate dose and refill sent to requested pharmacy.

## 2022-02-21 ENCOUNTER — Encounter (INDEPENDENT_AMBULATORY_CARE_PROVIDER_SITE_OTHER): Payer: Medicare PPO | Admitting: Ophthalmology

## 2022-02-21 DIAGNOSIS — H35033 Hypertensive retinopathy, bilateral: Secondary | ICD-10-CM

## 2022-02-21 DIAGNOSIS — H43813 Vitreous degeneration, bilateral: Secondary | ICD-10-CM | POA: Diagnosis not present

## 2022-02-21 DIAGNOSIS — H353111 Nonexudative age-related macular degeneration, right eye, early dry stage: Secondary | ICD-10-CM

## 2022-02-21 DIAGNOSIS — I10 Essential (primary) hypertension: Secondary | ICD-10-CM | POA: Diagnosis not present

## 2022-02-21 DIAGNOSIS — H353221 Exudative age-related macular degeneration, left eye, with active choroidal neovascularization: Secondary | ICD-10-CM | POA: Diagnosis not present

## 2022-03-13 ENCOUNTER — Other Ambulatory Visit: Payer: Self-pay | Admitting: Thoracic Surgery (Cardiothoracic Vascular Surgery)

## 2022-03-13 DIAGNOSIS — I7121 Aneurysm of the ascending aorta, without rupture: Secondary | ICD-10-CM

## 2022-03-15 DIAGNOSIS — L723 Sebaceous cyst: Secondary | ICD-10-CM | POA: Diagnosis not present

## 2022-03-15 DIAGNOSIS — L821 Other seborrheic keratosis: Secondary | ICD-10-CM | POA: Diagnosis not present

## 2022-03-21 DIAGNOSIS — R509 Fever, unspecified: Secondary | ICD-10-CM | POA: Diagnosis not present

## 2022-03-21 DIAGNOSIS — R051 Acute cough: Secondary | ICD-10-CM | POA: Diagnosis not present

## 2022-03-21 DIAGNOSIS — R059 Cough, unspecified: Secondary | ICD-10-CM | POA: Diagnosis not present

## 2022-03-21 DIAGNOSIS — U071 COVID-19: Secondary | ICD-10-CM | POA: Diagnosis not present

## 2022-03-21 DIAGNOSIS — R5383 Other fatigue: Secondary | ICD-10-CM | POA: Diagnosis not present

## 2022-04-10 ENCOUNTER — Telehealth: Payer: Self-pay

## 2022-04-10 MED ORDER — PREDNISONE 50 MG PO TABS: ORAL_TABLET | ORAL | 0 refills | Status: DC

## 2022-04-10 MED ORDER — DIPHENHYDRAMINE HCL 50 MG PO TABS: 50.0000 mg | ORAL_TABLET | Freq: Once | ORAL | 0 refills | Status: DC

## 2022-04-10 NOTE — Telephone Encounter (Addendum)
Phone call to patient to review instructions for 13 hr prep for CT w/ contrast on 04/16/22 at 11:00 AM. Prescription called into [facility]. Pt aware and verbalized understanding of instructions. Prescription: Pt to take 50 mg of prednisone on 04/15/22 at 10:00 pm, 50 mg of prednisone on 04/16/22 at 4:00 AM, and 50 mg of prednisone on 04/16/22 at 10:00 AM. Pt is also to take 50 mg of benadryl on 04/16/22 at 10:00 AM. Please call [phone] with any questions.   Benadryl was also called in as a prescription at the patients request. Pt advised not to drive the day of taking this medication as it may cause drowsiness. Pt verbalized understanding.

## 2022-04-16 ENCOUNTER — Ambulatory Visit
Admission: RE | Admit: 2022-04-16 | Discharge: 2022-04-16 | Disposition: A | Discharge status: Home / Self Care | Payer: Medicare PPO | Source: Ambulatory Visit | Attending: Thoracic Surgery (Cardiothoracic Vascular Surgery) | Admitting: Thoracic Surgery (Cardiothoracic Vascular Surgery)

## 2022-04-16 DIAGNOSIS — I7121 Aneurysm of the ascending aorta, without rupture: Secondary | ICD-10-CM

## 2022-04-16 DIAGNOSIS — S2242XA Multiple fractures of ribs, left side, initial encounter for closed fracture: Secondary | ICD-10-CM | POA: Diagnosis not present

## 2022-04-16 DIAGNOSIS — I712 Thoracic aortic aneurysm, without rupture, unspecified: Secondary | ICD-10-CM | POA: Diagnosis not present

## 2022-04-16 DIAGNOSIS — I359 Nonrheumatic aortic valve disorder, unspecified: Secondary | ICD-10-CM | POA: Diagnosis not present

## 2022-04-16 DIAGNOSIS — I2510 Atherosclerotic heart disease of native coronary artery without angina pectoris: Secondary | ICD-10-CM | POA: Diagnosis not present

## 2022-04-16 MED ORDER — IOPAMIDOL (ISOVUE-370) INJECTION 76%
75.0000 mL | Freq: Once | INTRAVENOUS | Status: AC | PRN
  Administered 2022-04-16: 75 mL via INTRAVENOUS

## 2022-04-24 NOTE — Progress Notes (Signed)
[address]       [place] [address]             [place] LR:[id number] Feb 22, 1945  History of Present Illness:  Mr. Leontae Leisy is a 77 year old gentleman with a past history of dyslipidemia, chronic atrial fibrillation, hypertension, myasthenia gravis, pericarditis, and gastroesophageal reflux disease. He was discovered to have an ascending aortic aneurysm several years ago and has been followed periodically by our office for surveillance.  He was last evaluated in our office by Enid Cutter PA-C at which time his aneurysm was stable.  Overall the patient is doing well.  He states his Myasthenia Gravis is well controlled.  His Atrial Fibrillation is under good control.  He is compliant with his Eliquis.  He questions the broken ribs identified on his CT scan.  He did suffer a fall in December that I feel is likely the source.  He denies chest pain, shortness of breath, and palpitations.  Current Outpatient Medications on File Prior to Visit  Medication Sig Dispense Refill   Alpha-Lipoic Acid 300 MG CAPS Take 300 mg by mouth daily.      apixaban (ELIQUIS) 5 MG TABS tablet TAKE 1 TABLET BY MOUTH TWICE DAILY 180 tablet 3   atorvastatin (LIPITOR) 20 MG tablet TAKE 1 TABLET BY MOUTH DAILY 30 tablet 11   cholecalciferol (VITAMIN D3) 25 MCG (1000 UNIT) tablet Take 1,000 Units by mouth daily.     Coenzyme Q10 (COQ-10) 100 MG CAPS Take 100 mg by mouth daily.      digoxin (LANOXIN) 0.25 MG tablet TAKE 1 TABLET(0.25 MG) BY MOUTH DAILY 90 tablet 3   diphenhydrAMINE (BENADRYL) 50 MG tablet Take 1 tablet (50 mg total) by mouth once for 1 dose. Pt to take 50 mg of benadryl on 04/16/22 at 10:00 AM. Please call [phone] with any questions. 1 tablet 0   GEMTESA 75 MG TABS Take by mouth.     lidocaine (LIDODERM) 5 % Place 1 patch onto the skin daily. Remove & Discard patch within 12 hours or as directed by MD 5 patch 0   methocarbamol (ROBAXIN)  500 MG tablet Take 1 tablet (500 mg total) by mouth every 8 (eight) hours as needed for muscle spasms. 30 tablet 0   Multiple Vitamins-Minerals (CENTRUM SILVER PO) Take 1 tablet by mouth daily.     mycophenolate (CELLCEPT) 250 MG capsule Take 2 capsules (500 mg total) by mouth every morning AND 1 capsule (250 mg total) every evening. 270 capsule 3   Omega-3 Fatty Acids (OMEGA-3 PO) Take 1,040 mg by mouth daily.     predniSONE (DELTASONE) 50 MG tablet Pt to take 50 mg of prednisone on 04/15/22 at 10:00 pm, 50 mg of prednisone on 04/16/22 at 4:00 AM, and 50 mg of prednisone on 04/16/22 at 10:00 AM. Pt is also to take 50 mg of benadryl on 04/16/22 at 10:00 AM. Please call [phone] with any questions. (Patient not taking: Reported on 04/25/2022) 3 tablet 0   pyridostigmine (MESTINON) 60 MG tablet Take 1 tablet (60 mg total) by mouth 3 (three) times daily as needed. 90 tablet 11   ramipril (ALTACE) 5 MG capsule Take 5 mg by mouth daily.     Resveratrol 100 MG CAPS Take 100 mg by mouth 2 (two) times daily.      tamsulosin (FLOMAX) 0.4 MG CAPS capsule Take 0.4 mg by mouth daily.  triamterene-hydrochlorothiazide (MAXZIDE-25) 37.5-25 MG tablet Take 1 tablet by mouth daily.     TURMERIC PO Take 100 mg by mouth 2 (two) times daily.     No current facility-administered medications on file prior to visit.     BP 104/61   Pulse 65   Resp 20   Ht '5\' 7"'$  (1.702 m)   Wt 196 lb (88.9 kg)   SpO2 95% Comment: RA  BMI 30.70 kg/m   Physical Exam  Gen: NAD Heart: Irregular  Lungs: CTA bilaterally Neck: No carotid bruit Abd; soft non-tender, normal bowel sounds Ext: no edema Neuro: grossly intact  CTA Results:  CLINICAL DATA:  Aortic aneurysm follow-up   EXAM: CT ANGIOGRAPHY CHEST WITH CONTRAST   TECHNIQUE: Multidetector CT imaging of the chest was performed using the standard protocol during bolus administration of intravenous contrast. Multiplanar CT image reconstructions and MIPs were obtained  to evaluate the vascular anatomy.   RADIATION DOSE REDUCTION: This exam was performed according to the departmental dose-optimization program which includes automated exposure control, adjustment of the mA and/or kV according to patient size and/or use of iterative reconstruction technique.   CONTRAST:  38m ISOVUE-370 IOPAMIDOL (ISOVUE-370) INJECTION 76%   COMPARISON:  10/19/2021   FINDINGS: Cardiovascular: Preferential opacification of the thoracic aorta. No aortic dissection. Ascending thoracic aortic aneurysm measuring 4.3 x 4.3 cm (previously 4.4 cm). Atherosclerotic calcifications of the aorta and coronary arteries. Main pulmonary artery measures 3.5 cm in diameter. No central pulmonary arterial filling defects. Heart size is normal. No pericardial effusion.   Mediastinum/Nodes: No enlarged mediastinal, hilar, or axillary lymph nodes. Thyroid gland, trachea, and esophagus demonstrate no significant findings.   Lungs/Pleura: Lungs are clear. No pleural effusion or pneumothorax.   Upper Abdomen: No acute abnormality.   Musculoskeletal: Subacute nondisplaced fractures of the left eleventh rib which is fractured in 2 locations. Osseous structures are otherwise intact. No chest wall abnormality.   Review of the MIP images confirms the above findings.   IMPRESSION: 1. Ascending thoracic aortic aneurysm measuring 4.3 x 4.3 cm (previously 4.4 cm). Recommend annual imaging followup by CTA or MRA. This recommendation follows 2010 ACCF/AHA/AATS/ACR/ASA/SCA/SCAI/SIR/STS/SVM Guidelines for the Diagnosis and Management of Patients with Thoracic Aortic Disease. Circulation. 2010; 121:[id number] Aortic aneurysm NOS (ICD10-I71.9) 2. Subacute nondisplaced fractures of the left eleventh rib, which is fractured in 2 locations. 3. Aortic and coronary artery atherosclerosis (ICD10-I70.0)    Electronically Signed   By: NDavina PokeD.O.   On: 04/16/2022 11:40   A/P:  Ascending  Aortic Aneurysm- measuring 4.4 cm, this remains stable Myasthenia Gravis Fractured Ribs on CT scan- suffered a fall in December. Atrial Fibrillation on Xarelto HTN- well controlled HLD   RTC in 1 year with repeat CTA chest    Risk Modification:  Statin:  Yes  Patient was counseled on importance of Blood Pressure Control.  Despite Medical intervention if the patient notices persistently elevated blood pressure readings.  They are instructed to contact their Primary Care Physician  Please avoid use of Fluoroquinolones as this can potentially increase your risk of Aortic Rupture and/or Dissection  Patient educated on signs and symptoms of Aortic Dissection, handout also provided in AVS  Lakaisha Danish, PA-C 04/25/22

## 2022-04-25 ENCOUNTER — Ambulatory Visit: Payer: Medicare PPO | Admitting: Physician Assistant

## 2022-04-25 VITALS — BP 104/61 | HR 65 | Resp 20 | Ht 67.0 in | Wt 196.0 lb

## 2022-04-25 DIAGNOSIS — I7121 Aneurysm of the ascending aorta, without rupture: Secondary | ICD-10-CM

## 2022-05-21 DIAGNOSIS — N281 Cyst of kidney, acquired: Secondary | ICD-10-CM | POA: Diagnosis not present

## 2022-05-21 DIAGNOSIS — N401 Enlarged prostate with lower urinary tract symptoms: Secondary | ICD-10-CM | POA: Diagnosis not present

## 2022-05-21 DIAGNOSIS — R350 Frequency of micturition: Secondary | ICD-10-CM | POA: Diagnosis not present

## 2022-06-05 DIAGNOSIS — D6869 Other thrombophilia: Secondary | ICD-10-CM | POA: Diagnosis not present

## 2022-06-05 DIAGNOSIS — I4819 Other persistent atrial fibrillation: Secondary | ICD-10-CM | POA: Diagnosis not present

## 2022-06-05 DIAGNOSIS — I10 Essential (primary) hypertension: Secondary | ICD-10-CM | POA: Diagnosis not present

## 2022-06-05 DIAGNOSIS — D84821 Immunodeficiency due to drugs: Secondary | ICD-10-CM | POA: Diagnosis not present

## 2022-06-05 DIAGNOSIS — I7121 Aneurysm of the ascending aorta, without rupture: Secondary | ICD-10-CM | POA: Diagnosis not present

## 2022-06-05 DIAGNOSIS — Z0000 Encounter for general adult medical examination without abnormal findings: Secondary | ICD-10-CM | POA: Diagnosis not present

## 2022-06-05 DIAGNOSIS — G7000 Myasthenia gravis without (acute) exacerbation: Secondary | ICD-10-CM | POA: Diagnosis not present

## 2022-06-05 DIAGNOSIS — E782 Mixed hyperlipidemia: Secondary | ICD-10-CM | POA: Diagnosis not present

## 2022-06-05 DIAGNOSIS — E559 Vitamin D deficiency, unspecified: Secondary | ICD-10-CM | POA: Diagnosis not present

## 2022-06-05 LAB — LAB REPORT - SCANNED: EGFR: 94

## 2022-06-21 ENCOUNTER — Encounter: Payer: Self-pay | Admitting: Neurology

## 2022-06-21 ENCOUNTER — Ambulatory Visit (INDEPENDENT_AMBULATORY_CARE_PROVIDER_SITE_OTHER): Payer: Medicare PPO | Admitting: Neurology

## 2022-06-21 VITALS — BP 120/80 | HR 80 | Ht 67.0 in | Wt 198.5 lb

## 2022-06-21 DIAGNOSIS — G7000 Myasthenia gravis without (acute) exacerbation: Secondary | ICD-10-CM

## 2022-06-21 DIAGNOSIS — R5383 Other fatigue: Secondary | ICD-10-CM | POA: Insufficient documentation

## 2022-06-21 MED ORDER — PYRIDOSTIGMINE BROMIDE 60 MG PO TABS: 60.0000 mg | ORAL_TABLET | Freq: Three times a day (TID) | ORAL | 11 refills | Status: DC | PRN
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
## 2022-06-27 ENCOUNTER — Encounter (INDEPENDENT_AMBULATORY_CARE_PROVIDER_SITE_OTHER): Payer: Medicare PPO | Admitting: Ophthalmology

## 2022-06-27 DIAGNOSIS — H35033 Hypertensive retinopathy, bilateral: Secondary | ICD-10-CM | POA: Diagnosis not present

## 2022-06-27 DIAGNOSIS — H353111 Nonexudative age-related macular degeneration, right eye, early dry stage: Secondary | ICD-10-CM

## 2022-06-27 DIAGNOSIS — H353221 Exudative age-related macular degeneration, left eye, with active choroidal neovascularization: Secondary | ICD-10-CM

## 2022-06-27 DIAGNOSIS — I10 Essential (primary) hypertension: Secondary | ICD-10-CM

## 2022-06-27 DIAGNOSIS — H43813 Vitreous degeneration, bilateral: Secondary | ICD-10-CM

## 2022-07-15 IMAGING — CT CT ABD-PELV W/O CM
2 of 4 series · 16 of 46 positions shown, 18 images · non-contrast
Comparison: September 08, 2019.

CLINICAL DATA: Leukocytosis status post appendectomy.

EXAM:
CT ABDOMEN AND PELVIS WITHOUT CONTRAST
TECHNIQUE: Multidetector CT imaging of the abdomen and pelvis was performed
following the standard protocol without IV contrast.

[Series 2: axial st · axial · 0.89mm/px · z∈[+1144,+1564]mm · 13 of 94 slices shown, 15 images]
[im 5/94  soft-tissue]
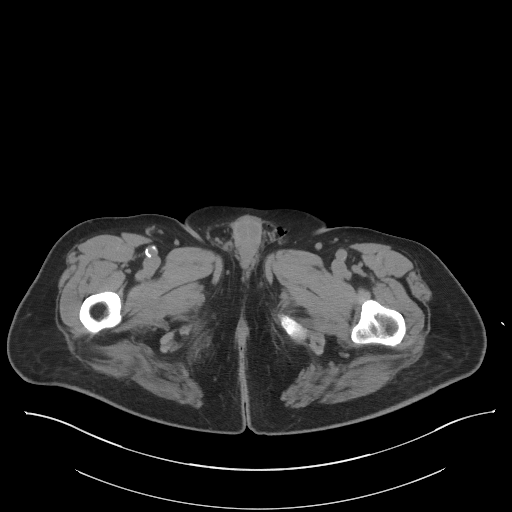
[im 5/94  bone]
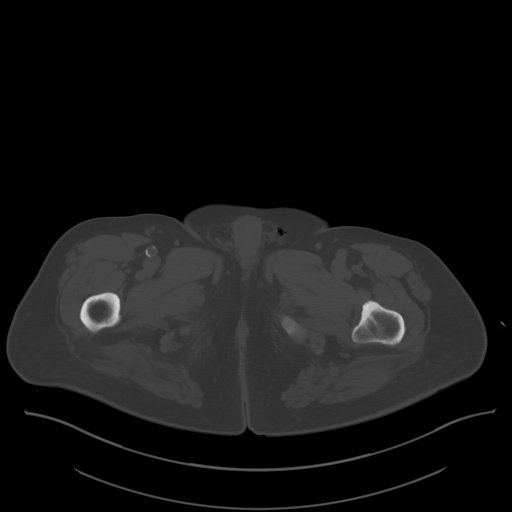
[im 15/94  soft-tissue]
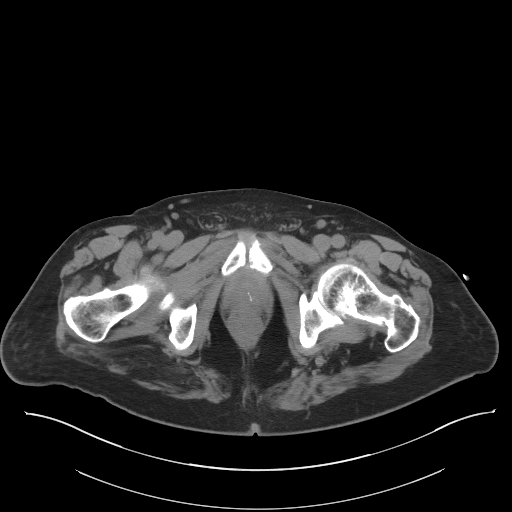
[im 20/94  soft-tissue]
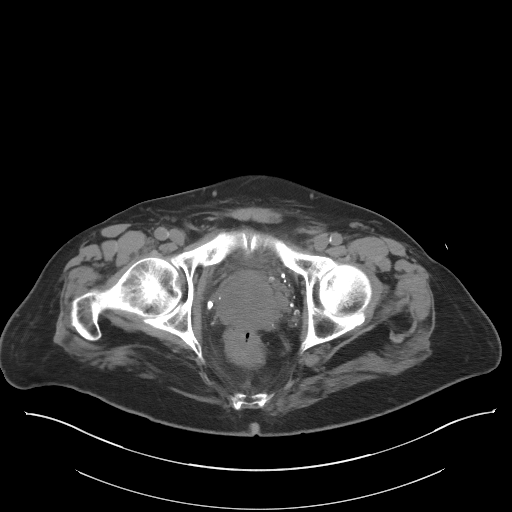
[im 25/94  soft-tissue]
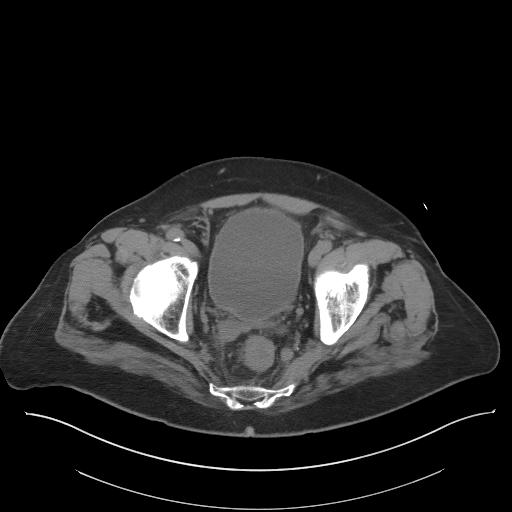
[im 35/94  soft-tissue]
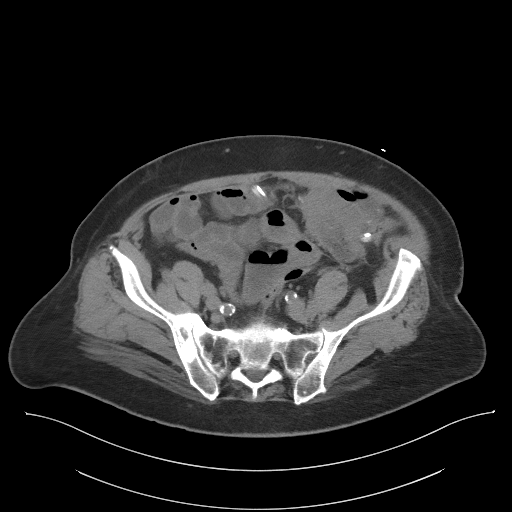
[im 40/94  soft-tissue]
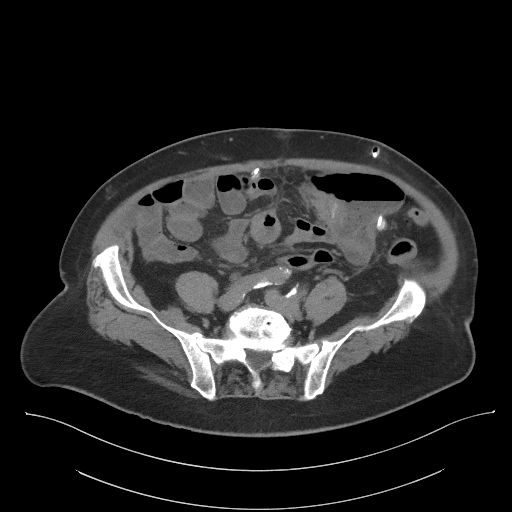
[im 49/94  soft-tissue]
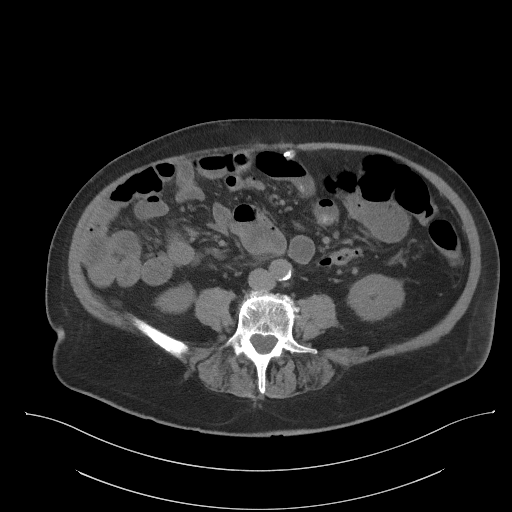
[im 54/94  soft-tissue]
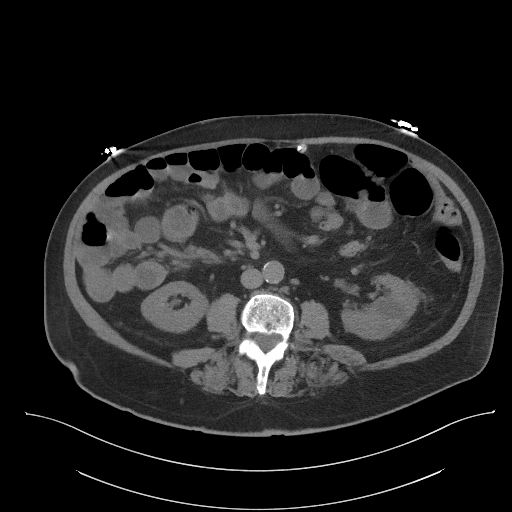
[im 59/94  soft-tissue]
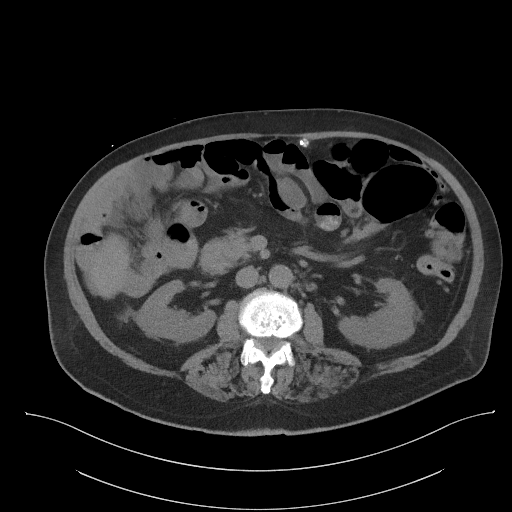
[im 59/94  bone]
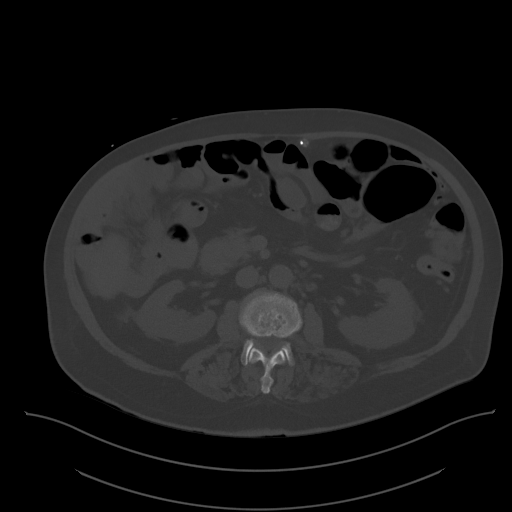
[im 69/94  soft-tissue]
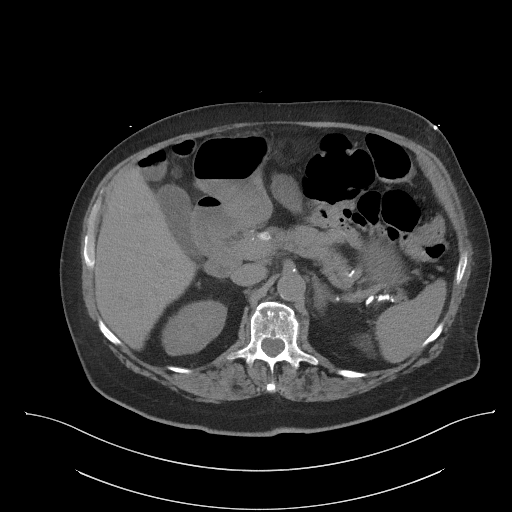
[im 74/94  soft-tissue]
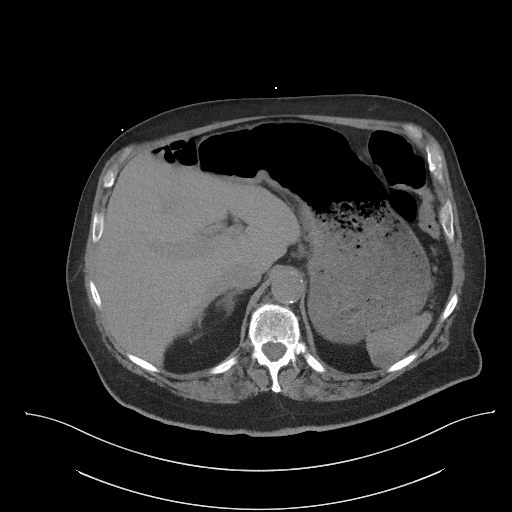
[im 79/94  soft-tissue]
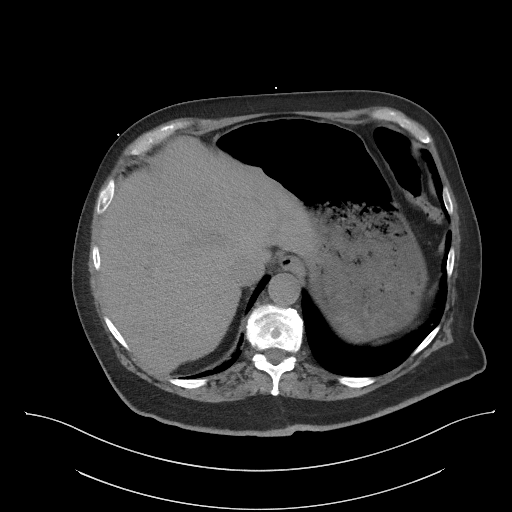
[im 89/94  soft-tissue]
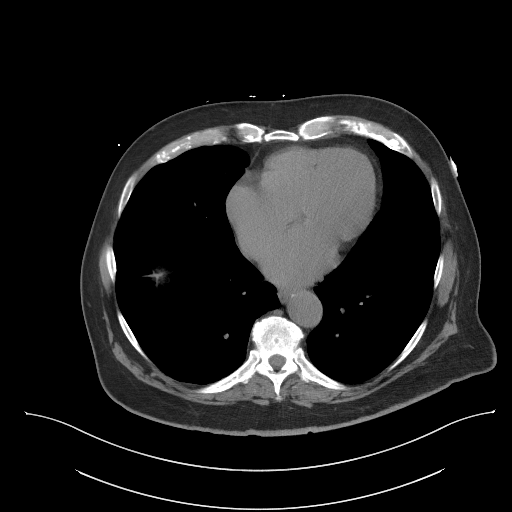

[Series 5: coronal st · coronal · 0.82mm/px · 3 of 101 slices shown]
[im 34/101  soft-tissue]
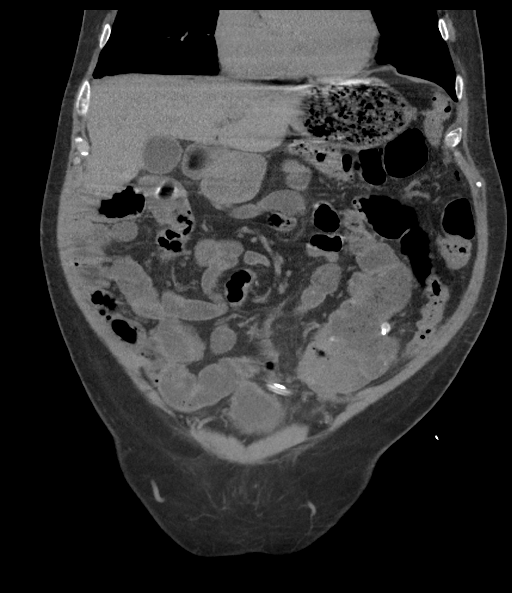
[im 45/101  soft-tissue]
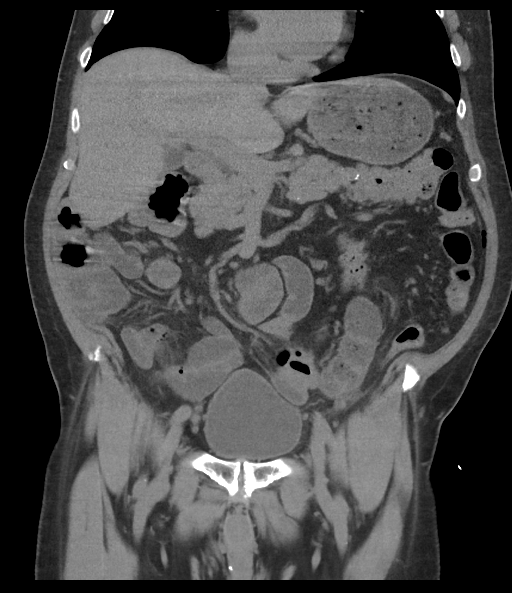
[im 56/101  soft-tissue]
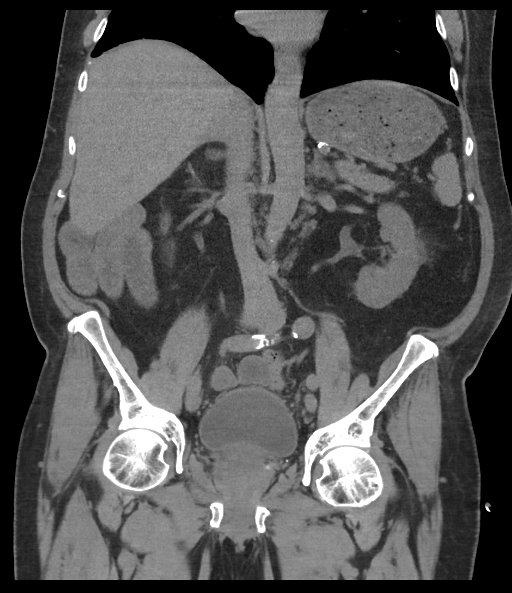

[16 of 46 positions shown; findings below may reference images not displayed]

FINDINGS: Lower chest: No acute abnormality.

Hepatobiliary: No focal liver abnormality is seen. No gallstones,
gallbladder wall thickening, or biliary dilatation.

Pancreas: Unremarkable. No pancreatic ductal dilatation or
surrounding inflammatory changes.

Spleen: Normal in size without focal abnormality.

Adrenals/Urinary Tract: Adrenal glands appear normal. Stable left
renal cyst is noted. Small nonobstructive left renal calculus is
noted. No hydronephrosis or renal obstruction is noted. Urinary
bladder is unremarkable.

Stomach/Bowel: Status post appendectomy. The stomach is
unremarkable. There is no evidence of bowel obstruction or
inflammation. Surgical drain is noted entering right lower quadrant
with distal tip in epigastric region.

Vascular/Lymphatic: Aortic atherosclerosis. No enlarged abdominal or
pelvic lymph nodes.

Reproductive: Stable moderate prostatic enlargement is noted.

Other: No abnormal fluid collection is noted. No definite hernia is
noted.

Musculoskeletal: Multilevel degenerative disc disease is noted in
the lumbar spine. No acute abnormality is noted.
IMPRESSION: 1. Status post appendectomy. Surgical drain is noted entering right
lower quadrant with distal tip in epigastric region. No abnormal
fluid collection is noted.
2. Small nonobstructive left renal calculus. No hydronephrosis or
renal obstruction is noted.
3. Stable moderate prostatic enlargement.

Aortic Atherosclerosis ([id number]).

## 2022-08-08 ENCOUNTER — Institutional Professional Consult (permissible substitution): Payer: Medicare PPO | Admitting: Neurology

## 2022-09-05 ENCOUNTER — Encounter: Payer: Self-pay | Admitting: Neurology

## 2022-09-05 ENCOUNTER — Ambulatory Visit: Payer: Medicare PPO | Admitting: Neurology

## 2022-09-05 VITALS — BP 105/68 | HR 50 | Ht 67.0 in | Wt 195.0 lb

## 2022-09-05 DIAGNOSIS — E669 Obesity, unspecified: Secondary | ICD-10-CM | POA: Diagnosis not present

## 2022-09-05 DIAGNOSIS — G7000 Myasthenia gravis without (acute) exacerbation: Secondary | ICD-10-CM

## 2022-09-05 DIAGNOSIS — R0683 Snoring: Secondary | ICD-10-CM | POA: Diagnosis not present

## 2022-09-05 DIAGNOSIS — I482 Chronic atrial fibrillation, unspecified: Secondary | ICD-10-CM | POA: Diagnosis not present

## 2022-09-05 DIAGNOSIS — Z820 Family history of epilepsy and other diseases of the nervous system: Secondary | ICD-10-CM | POA: Diagnosis not present

## 2022-09-05 DIAGNOSIS — G4719 Other hypersomnia: Secondary | ICD-10-CM | POA: Diagnosis not present

## 2022-09-05 DIAGNOSIS — Z9189 Other specified personal risk factors, not elsewhere classified: Secondary | ICD-10-CM

## 2022-09-05 NOTE — Progress Notes (Signed)
Subjective:    Patient ID: [name]. is a 77 y.o. male.  HPI    David Foley, MD, PhD [facility] [address] [address] [place], [place] [address]  Dear Vivia Ewing,  I saw your patient, [name], upon your kind request in my sleep clinic today for initial consultation of his sleep disorder, in particular, concern for underlying obstructive sleep apnea.  The patient is unaccompanied today.  As you know, [name] is a 77 year old male with an underlying complex medical history of myasthenia gravis, Bell's palsy, history of pericarditis, A-fib, ascending aortic aneurysm (followed by CT surgery), hyperlipidemia, reflux disease, hypertension, and borderline obesity, who reports snoring and excessive daytime somnolence. In the past several months his fatigue has increased and he feels tired in the afternoons, typically has to take a nap, currently naps daily.  His Epworth sleepiness score is 4 out of 24, fatigue severity score is 31 out of 63.  I reviewed your office note from 06/21/2022.  His brother has a history of sleep apnea.  He suspects that his dad had sleep apnea as well.  Patient lives with his wife, daughter lives in [place], he has 3 grandchildren and 3 great-grandchildren.  Some 10 years ago, in or around 2014 he had a sleep study through [facility] sleep and it was negative for sleep apnea at the time.  He has since then lost about 50 pounds over the course of 10 years.  Bedtime is generally between 11 and midnight and rise time between 5 and 6.  He has no nightly nocturia and denies any nocturnal or recurrent morning headaches.  He had a tonsillectomy as a child.  He drinks no daily caffeine, alcohol very occasionally to rarely, quit smoking in the past I some 30 years ago.  They have 2 dogs in the household, 1 sleeps on the bed and the other in the cage on the bedroom floor.  They do not have a TV in the bedroom.  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 His Past Medical History Is Significant For: Past Medical History:  Diagnosis Date   A-fib (HCC)    Dysrhythmia    GERD (gastroesophageal reflux disease)    History of Bell's palsy    History of pericarditis    Hypertension    Mixed hyperlipidemia    Myasthenia gravis (HCC)    currently in remission (11/2014)     His Past Surgical History Is Significant For: Past Surgical History:  Procedure Laterality Date   CARDIOVERSION N/A 09/21/2016   Procedure: CARDIOVERSION;  Surgeon: Thurmon Fair, MD;  Location: [facility] ENDOSCOPY;  Service: Cardiovascular;  Laterality: N/A;   CARDIOVERSION N/A 10/17/2016   Procedure: CARDIOVERSION;  Surgeon: Laurey Morale, MD;  Location: [facility] ENDOSCOPY;  Service: Cardiovascular;  Laterality: N/A;   CARDIOVERSION N/A 10/31/2016   Procedure: CARDIOVERSION;  Surgeon: Wendall Stade, MD;  Location: [facility] ENDOSCOPY;  Service: Cardiovascular;  Laterality: N/A;   LAMINECTOMY  1991   LAPAROSCOPIC APPENDECTOMY N/A 09/10/2019   Procedure: APPENDECTOMY LAPAROSCOPIC;  Surgeon: Luretha Murphy, MD;  Location: [facility] ORS;  Service: General;  Laterality: N/A;   TEE WITHOUT CARDIOVERSION N/A 09/21/2016   Procedure: TRANSESOPHAGEAL ECHOCARDIOGRAM (TEE);  Surgeon: Thurmon Fair, MD;  Location: [facility] ENDOSCOPY;  Service: Cardiovascular;  Laterality: N/A;   TEE WITHOUT CARDIOVERSION N/A 10/31/2016   Procedure: TRANSESOPHAGEAL ECHOCARDIOGRAM (TEE);  Surgeon: Charlton Haws  C, MD;  Location: [facility] ENDOSCOPY;  Service: Cardiovascular;  Laterality: N/A;   TONSILLECTOMY  1950    His Family History Is Significant For: Family History  Problem Relation Age of Onset   Cancer Mother    Heart disease Father    Cancer Brother        bladder/ in remission   Healthy Daughter        age 70    His Social History Is Significant For: Social History   Socioeconomic History   Marital status: Married     Spouse name: Not on file   Number of children: 1   Years of education: Grad Sch   Highest education level: Not on file  Occupational History   Occupation: Pharmacologist  Tobacco Use   Smoking status: Former    Types: Pipe    Quit date: 12/10/1966    Years since quitting: 55.7   Smokeless tobacco: Never  Vaping Use   Vaping status: Never Used  Substance and Sexual Activity   Alcohol use: Not Currently    Alcohol/week: 1.0 standard drink of alcohol    Types: 1 Glasses of wine per week   Drug use: No   Sexual activity: Yes  Other Topics Concern   Not on file  Social History Narrative   Lives at home with his wife.   Right-handed.   Occasional caffeine use.   Social Determinants of Health   Financial Resource Strain: Not on file  Food Insecurity: Not on file  Transportation Needs: Not on file  Physical Activity: Not on file  Stress: Not on file  Social Connections: Not on file    His Allergies Are:  Allergies  Allergen Reactions   Demerol [Meperidine]     hallucinations   Aminoglycosides     Can not use due to mysthenia gravis   Beta Adrenergic Blockers     Can not use due to mysthenia gravis   Calcium Channel Blockers     Can not use due to mysthenia gravis   Ciprofloxacin     Can not use due to mysthenia gravis   Penicillamine     Can not use due to mysthenia gravis   Procainamide Hcl     Can not use due to mysthenia gravis   Quinine Derivatives     Can not use due to mysthenia gravis   Succinylcholine Chloride     Can not use due to mysthenia gravis   Vecuronium Bromide [Vecuronium]     Can not use due to mysthenia gravis  :   His Current Medications Are:  Outpatient Encounter Medications as of 09/05/2022  Medication Sig   Alpha-Lipoic Acid 300 MG CAPS Take 300 mg by mouth daily.    apixaban (ELIQUIS) 5 MG TABS tablet TAKE 1 TABLET BY MOUTH TWICE DAILY   atorvastatin (LIPITOR) 20 MG tablet TAKE 1 TABLET BY MOUTH DAILY   cholecalciferol (VITAMIN  D3) 25 MCG (1000 UNIT) tablet Take 1,000 Units by mouth daily.   Coenzyme Q10 (COQ-10) 100 MG CAPS Take 100 mg by mouth daily.    digoxin (LANOXIN) 0.25 MG tablet TAKE 1 TABLET(0.25 MG) BY MOUTH DAILY   GEMTESA 75 MG TABS Take by mouth.   Multiple Vitamins-Minerals (CENTRUM SILVER PO) Take 1 tablet by mouth daily.   mycophenolate (CELLCEPT) 250 MG capsule Take 2 capsules (500 mg total) by mouth every morning AND 1 capsule (250 mg total) every evening. (Patient taking differently: One twice a day)   Omega-3  Fatty Acids (OMEGA-3 PO) Take 1,040 mg by mouth daily.   pyridostigmine (MESTINON) 60 MG tablet Take 1 tablet (60 mg total) by mouth 3 (three) times daily as needed.   ramipril (ALTACE) 5 MG capsule Take 5 mg by mouth daily.   Resveratrol 100 MG CAPS Take 100 mg by mouth 2 (two) times daily.    tamsulosin (FLOMAX) 0.4 MG CAPS capsule Take 0.4 mg by mouth daily.   triamterene-hydrochlorothiazide (MAXZIDE-25) 37.5-25 MG tablet Take 1 tablet by mouth daily.   TURMERIC PO Take 100 mg by mouth 2 (two) times daily.   No facility-administered encounter medications on file as of 09/05/2022.  :   Review of Systems:  Out of a complete 14 point review of systems, all are reviewed and negative with the exception of these symptoms as listed below:   Review of Systems  Neurological:        Rm 5 alone Pt is well, reports he is having some fatigue but feels it is due to Myasthenia gravis. He does take a hr-2 hr nap almost daily. Gets about 5-6 hrs of sleep a night.     Objective:  Neurological Exam  Physical Exam Physical Examination:   Vitals:   09/05/22 1115  BP: 105/68  Pulse: (!) 50    General Examination: The patient is a very pleasant 77 y.o. male in no acute distress. He appears well-developed and well-nourished and very well groomed.   HEENT: Normocephalic, atraumatic, pupils are equal, round and reactive to light, extraocular tracking is good without limitation to gaze excursion  or nystagmus noted. Hearing is grossly intact. Face is symmetric with normal facial animation. Speech is clear with no dysarthria noted. There is no hypophonia. There is no lip, neck/head, jaw or voice tremor. Neck is supple with full range of passive and active motion. There are no carotid bruits on auscultation. Oropharynx exam reveals: mild mouth dryness, adequate dental hygiene and moderate airway crowding, due to small airway entry and redundant soft palate, wider uvula.  Mallampati class III and tip of uvula not visualized, tonsils absent.  Tongue protrudes centrally and palate elevates symmetrically, neck circumference 15 3/8 inches.  Mild overbite noted.  Chest: Clear to auscultation without wheezing, rhonchi or crackles noted.  Heart: S1+S2+0, irregularly irreg., bradycardia noted.  Abdomen: Soft, non-tender and non-distended.  Extremities: There is no pitting edema in the distal lower extremities bilaterally.   Skin: Warm and dry without trophic changes noted.   Musculoskeletal: exam reveals no obvious joint deformities.   Neurologically:  Mental status: The patient is awake, alert and oriented in all 4 spheres. His immediate and remote memory, attention, language skills and fund of knowledge are appropriate. There is no evidence of aphasia, agnosia, apraxia or anomia. Speech is clear with normal prosody and enunciation. Thought process is linear. Mood is normal and affect is normal.  Cranial nerves II - XII are as described above under HEENT exam.  Motor exam: Normal bulk, strength and tone is noted. There is no obvious action or resting tremor.  Fine motor skills and coordination: grossly intact.  Cerebellar testing: No dysmetria or intention tremor. There is no truncal or gait ataxia.  Sensory exam: intact to light touch in the upper and lower extremities.  Gait, station and balance: He stands easily. No veering to one side is noted. No leaning to one side is noted. Posture is  age-appropriate and stance is narrow based. Gait shows normal stride length and normal pace. No problems turning  are noted.   Assessment and Plan:  In summary, [name]. is a very pleasant 77 y.o.-year old male with an underlying complex medical history of myasthenia gravis, Bell's palsy, history of pericarditis, A-fib, ascending aortic aneurysm (followed by CT surgery), hyperlipidemia, reflux disease, hypertension, and borderline obesity, whose history and physical exam are concerning for sleep disordered breathing, particularly obstructive sleep apnea (OSA). A laboratory attended sleep study is typically considered "gold standard" for evaluation of sleep disordered breathing.   I had a long chat with the patient about my findings and the diagnosis of sleep apnea, particularly OSA, its prognosis and treatment options. We talked about medical/conservative treatments, surgical interventions and non-pharmacological approaches for symptom control. I explained, in particular, the risks and ramifications of untreated moderate to severe OSA, especially with respect to developing cardiovascular disease down the road, including congestive heart failure (CHF), difficult to treat hypertension, cardiac arrhythmias (particularly A-fib), neurovascular complications including TIA, stroke and dementia. Even type 2 diabetes has, in part, been linked to untreated OSA. Symptoms of untreated OSA may include (but may not be limited to) daytime sleepiness, nocturia (i.e. frequent nighttime urination), memory problems, mood irritability and suboptimally controlled or worsening mood disorder such as depression and/or anxiety, lack of energy, lack of motivation, physical discomfort, as well as recurrent headaches, especially morning or nocturnal headaches. We talked about the importance of maintaining a healthy lifestyle and striving for healthy weight. In addition, we talked about the importance of striving for and  maintaining good sleep hygiene. I recommended a sleep study at this time. I outlined the differences between a laboratory attended sleep study which is considered more comprehensive and accurate over the option of a home sleep test (HST); the latter may lead to underestimation of sleep disordered breathing in some instances and does not help with diagnosing upper airway resistance syndrome and is not accurate enough to diagnose primary central sleep apnea typically. I outlined possible surgical and non-surgical treatment options of OSA, including the use of a positive airway pressure (PAP) device (i.e. CPAP, AutoPAP/APAP or BiPAP in certain circumstances), a custom-made dental device (aka oral appliance, which would require a referral to a specialist dentist or orthodontist typically, and is generally speaking not considered for patients with full dentures or edentulous state), upper airway surgical options, such as traditional UPPP (which is not considered a first-line treatment) or the Inspire device (hypoglossal nerve stimulator, which would involve a referral for consultation with an ENT surgeon, after careful selection, following inclusion criteria - also not first-line treatment). I explained the PAP treatment option to the patient in detail, as this is generally considered first-line treatment.  The patient indicated that he would be willing to try PAP therapy, if the need arises. I explained the importance of being compliant with PAP treatment, not only for insurance purposes but primarily to improve patient's symptoms symptoms, and for the patient's long term health benefit, including to reduce His cardiovascular risks longer-term.    We will pick up our discussion about the next steps and treatment options after testing.  We will keep him posted as to the test results by phone call and/or MyChart messaging where possible.  We will plan to follow-up in sleep clinic accordingly as well.  I answered all  his questions today and the patient was in agreement.   I encouraged him to call with any interim questions, concerns, problems or updates or email [place] through MyChart.  Generally speaking, sleep test authorizations may take up  to 2 weeks, sometimes less, sometimes longer, the patient is encouraged to get in touch with [place] if they do not hear back from the sleep lab staff directly within the next 2 weeks.  Thank you very much for allowing me to participate in the care of this nice patient. If I can be of any further assistance to you please do not hesitate to call me at [phone].  Sincerely,   David Foley, MD, PhD

## 2022-09-05 NOTE — Patient Instructions (Signed)

## 2022-10-08 ENCOUNTER — Telehealth: Payer: Self-pay | Admitting: *Deleted

## 2022-10-08 NOTE — Telephone Encounter (Signed)
   Name: [name].  DOB: June 08, 1945  MRN: [id number]  Primary Cardiologist: Lesleigh Noe, MD (Inactive)  Chart reviewed as part of pre-operative protocol coverage. Because of David DACKO Jr.'s past medical history and time since last visit, he will require a follow-up in-office visit in order to better assess preoperative cardiovascular risk.  Patient is scheduled with Dr. Izora Ribas on 10/12/2022. Appointment notes were updated to reflect need for pre-op evaluation.   Pre-op covering staff:  - Please contact requesting surgeon's office via preferred method (i.e, phone, fax) to inform them of need for appointment prior to surgery.  This message will also be routed to pharmacy pool for input on holding Eliquis as requested below so that this information is available to the clearing provider at time of patient's appointment.   David Levering, NP  10/08/2022, 3:55 PM

## 2022-10-08 NOTE — Telephone Encounter (Signed)
   Pre-operative Risk Assessment    Patient Name: [name].  DOB: 1945-03-11 MRN: [id number]    DATE OF LAST VISIT: 09/14/21 DR. SMITH DATE OF NEXT VISIT: 10/12/22 [facility]. [facility]  Request for Surgical Clearance    Procedure:   COLONOSCOPY  Date of Surgery:  Clearance 11/06/22                                 Surgeon:  DR. Matthias Hughs Surgeon's Group or Practice Name:  [facility] Phone number:  [phone] Fax number:  [phone]   Type of Clearance Requested:   - Medical  - Pharmacy:  Hold Apixaban (Eliquis)     Type of Anesthesia:   PROPOFOL   Additional requests/questions:    [name]   10/08/2022, 2:53 PM

## 2022-10-08 NOTE — Telephone Encounter (Signed)
Will update all parties involved.

## 2022-10-08 NOTE — Telephone Encounter (Signed)
Please advise holding Eliquis prior to colonoscopy.  Thank you!  DW  

## 2022-10-09 NOTE — Telephone Encounter (Signed)
Patient with diagnosis of afib on Eliquis for anticoagulation.    Procedure: colonoscopy Date of procedure: 11/06/22  CHA2DS2-VASc Score = 4  This indicates a 4.8% annual risk of stroke. The patient's score is based upon: CHF History: 0 HTN History: 1 Diabetes History: 0 Stroke History: 0 Vascular Disease History: 1 Age Score: 2 Gender Score: 0  CrCl 72mL/min using adjusted body weight Platelet count 162K  Per office protocol, patient can hold Eliquis for 1-2 days prior to procedure as long as no new clinical concerns are noted at upcoming MD appt.    **This guidance is not considered finalized until pre-operative APP has relayed final recommendations.**

## 2022-10-11 NOTE — Progress Notes (Signed)
Cardiology Office Note:    Date:  10/12/2022   ID:  David Sharrett., DOB 04/10/1945, MRN [id number]  PCP:  [name], MD  Cardiologist:  Christell Constant, MD   Referring MD: [name], MD   CC: Transition to new cardiologist  History of Present Illness:    David Hurry. is a 77 y.o. male with a hx of permanent AF, chronic anticoagulation, ascending aortic aneurysm 4.8 cm June 2022 CT essential hypertension, hyperlipidemia, myasthenia gravis and nonobstructive CAD by cath 2002 (LAD luminal irregularities). He is a retired Building surveyor, and Nurse, mental health.  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   2016- MG In remission 2023: BP labile.  Patient notes that he is getting some unusual readings.   Since last visit notes that he has been checking his blood pressure fairly regularly to deal with labile.  Occasionally will get  There are no interval hospital/ED visit.   EKG showed AF- rate controlled.   On the Kardia mobile device  He notes three failed DCCV; longest time in SR was three weeks.  He has been in AF since 2018.    No chest pain or pressure .  No SOB/DOE and no PND/Orthopnea.  No weight gain or leg swelling.  No palpitations or syncope .   Past Medical History:  Diagnosis Date   A-fib (HCC)    Dysrhythmia    GERD (gastroesophageal reflux disease)    History of Bell's palsy    History of pericarditis    Hypertension    Mixed hyperlipidemia    Myasthenia gravis (HCC)    currently in remission (11/2014)     Past Surgical History:  Procedure Laterality Date   CARDIOVERSION N/A 09/21/2016   Procedure: CARDIOVERSION;  Surgeon: Thurmon Fair, MD;  Location: [facility] ENDOSCOPY;  Service: Cardiovascular;  Laterality: N/A;   CARDIOVERSION N/A 10/17/2016   Procedure: CARDIOVERSION;  Surgeon: Laurey Morale, MD;  Location: [facility] ENDOSCOPY;  Service: Cardiovascular;  Laterality: N/A;   CARDIOVERSION N/A 10/31/2016   Procedure:  CARDIOVERSION;  Surgeon: Wendall Stade, MD;  Location: [facility] ENDOSCOPY;  Service: Cardiovascular;  Laterality: N/A;   LAMINECTOMY  1991   LAPAROSCOPIC APPENDECTOMY N/A 09/10/2019   Procedure: APPENDECTOMY LAPAROSCOPIC;  Surgeon: Luretha Murphy, MD;  Location: [facility] ORS;  Service: General;  Laterality: N/A;   TEE WITHOUT CARDIOVERSION N/A 09/21/2016   Procedure: TRANSESOPHAGEAL ECHOCARDIOGRAM (TEE);  Surgeon: Thurmon Fair, MD;  Location: [facility] ENDOSCOPY;  Service: Cardiovascular;  Laterality: N/A;   TEE WITHOUT CARDIOVERSION N/A 10/31/2016   Procedure: TRANSESOPHAGEAL ECHOCARDIOGRAM (TEE);  Surgeon: Wendall Stade, MD;  Location: [facility] ENDOSCOPY;  Service: Cardiovascular;  Laterality: N/A;   TONSILLECTOMY  1950    Current Medications: Current Meds  Medication Sig   Alpha-Lipoic Acid 300 MG CAPS Take 300 mg by mouth daily.    apixaban (ELIQUIS) 5 MG TABS tablet TAKE 1 TABLET BY MOUTH TWICE DAILY   ascorbic acid (VITAMIN C) 1000 MG tablet daily.   atorvastatin (LIPITOR) 20 MG tablet TAKE 1 TABLET BY MOUTH DAILY   cholecalciferol (VITAMIN D3) 25 MCG (1000 UNIT) tablet Take 1,000 Units by mouth daily.   Coenzyme Q10 (COQ-10) 100 MG CAPS Take 100 mg by mouth daily.    digoxin (LANOXIN) 0.25 MG tablet TAKE 1 TABLET(0.25 MG) BY MOUTH DAILY   GEMTESA 75 MG TABS Take by mouth.   Multiple Vitamins-Minerals (CENTRUM SILVER PO) Take 1 tablet by mouth daily.   mycophenolate (CELLCEPT) 250 MG capsule  Take 2 capsules (500 mg total) by mouth every morning AND 1 capsule (250 mg total) every evening. (Patient taking differently: One twice a day)   Omega-3 Fatty Acids (OMEGA-3 PO) Take 1,040 mg by mouth daily.   pyridostigmine (MESTINON) 60 MG tablet Take 1 tablet (60 mg total) by mouth 3 (three) times daily as needed.   ramipril (ALTACE) 5 MG capsule Take 5 mg by mouth daily.   Resveratrol 100 MG CAPS Take 100 mg by mouth 2 (two) times daily.    tamsulosin (FLOMAX) 0.4 MG CAPS capsule Take 0.4 mg by mouth daily.    triamterene-hydrochlorothiazide (MAXZIDE-25) 37.5-25 MG tablet Take 1 tablet by mouth daily.   TURMERIC PO Take 100 mg by mouth 2 (two) times daily.     Allergies:   Demerol [meperidine], Aminoglycosides, Beta adrenergic blockers, Calcium channel blockers, Ciprofloxacin, Penicillamine, Procainamide hcl, Quinine derivatives, Succinylcholine chloride, and Vecuronium bromide [vecuronium]   Social History   Socioeconomic History   Marital status: Married    Spouse name: Not on file   Number of children: 1   Years of education: Grad Sch   Highest education level: Not on file  Occupational History   Occupation: Pharmacologist  Tobacco Use   Smoking status: Former    Types: Pipe    Quit date: 12/10/1966    Years since quitting: 55.8   Smokeless tobacco: Never  Vaping Use   Vaping status: Never Used  Substance and Sexual Activity   Alcohol use: Not Currently    Alcohol/week: 1.0 standard drink of alcohol    Types: 1 Glasses of wine per week   Drug use: No   Sexual activity: Yes  Other Topics Concern   Not on file  Social History Narrative   Lives at home with his wife.   Right-handed.   Occasional caffeine use.   Social Determinants of Health   Financial Resource Strain: Not on file  Food Insecurity: Not on file  Transportation Needs: Not on file  Physical Activity: Not on file  Stress: Not on file  Social Connections: Not on file     Family History: The patient's family history includes Cancer in his brother and mother; Healthy in his daughter; Heart disease in his father.  ROS:   Please see the history of present illness.      EKGs/Labs/Other Studies Reviewed:    The following studies were reviewed today:  Cardiac Studies & Procedures     STRESS TESTS  MYOCARDIAL PERFUSION IMAGING 01/06/2019  Narrative  There was no ST segment deviation noted during stress.  Nuclear stress EF: 55%.  The left ventricular ejection fraction is normal (55-65%).  The  study is normal.  This is a low risk study.  Fixed apical inferior perfusion defect.  Normal wall motion in this area suggests defect is artifact.   ECHOCARDIOGRAM  ECHOCARDIOGRAM COMPLETE 03/17/2018  Narrative TRANSTHORACIC ECHOCARDIOGRAM REPORT    Patient Name:   [name] Date of Exam: 03/17/2018 Medical Rec #:  [id number]           Height:       67.0 in Accession #:    [id number]          Weight:       215.0 lb Date of Birth:  1945-06-20          BSA:          2.09 m Patient Age:    6 years  BP:           120/83 mmHg Patient Gender: M                   HR:           83 bpm. Exam Location:  [address]   Procedure: 2D Echo, Cardiac Doppler, Color Doppler and 3D Echo  Indications:    I48.2 Atrial fibrillation  History:        Patient has prior history of Echocardiogram examinations. TEE 09/21/16 EF 50%, Pericardidis, Atrial Fibrillation; Risk Factors: Hypertension, Dyslipidemia and Former Smoker.  Sonographer:    Jorje Guild RDCS Referring Phys: [id number] Barry Dienes [facility]  IMPRESSIONS   1. The left ventricle has normal systolic function of 60-65%. The cavity size is normal. There is no left ventricular wall thickness. The left ventricular diastology could not be evaluated secondary to atrial fibrillation. 2. There is moderate dilatation of the ascending aorta and mild dilatation of the aortic root. 3. Moderately dilated left atrial size. 4. The right ventricle is mildly enlarged in size. There is normal systolic function. Right ventricular systolic pressure is normal with an estimated pressure of 23.1 mmHg. 5. The mitral valve normal in structure. Regurgitation is mild by color flow Doppler. 6. Tricuspid regurgitation is mild. 7. No atrial level shunt detected by color flow Doppler. 8. The inferior vena cava was dilated in size with >50% respiratory variablity.  FINDINGS Left Ventricle: No evidence of left ventricular regional wall motion abnormalities. The  left ventricle has normal systolic function of 55-60%. The cavity size is mildly increased. There is no left ventricular wall thickness. Echo evidence of Cannot assess due to atrial fibrillation diastolic filling patterns. The left ventricular diastology could not be evaluatedsecondary to atrial fibrillation. Right Ventricle: The right ventricle is mildly enlarged in size. There is normal hypertrophy. There is normal systolic function. Right ventricular systolic pressure is normal with an estimated pressure of 23.1 mmHg. Left Atrium: The left atrium is moderately dilated. Right Atrium: The right atrial size is normal in size. Interatrial Septum: No atrial level shunt detected by color flow Doppler.  Pericardium: There is no evidence of pericardial effusion. Mitral Valve: The mitral valve normal in structure. Regurgitation is mild by color flow Doppler. Tricuspid Valve: The tricuspid valve is normal in structure. Tricuspid regurgitation is mild by color flow Doppler. Aortic Valve: The aortic valve normal in structure and function. Pulmonic Valve: The pulmonic valve is normal. Pulmonic valve regurgitation is trivial by color flow Doppler. Aorta: There is moderate dilatation of the ascending aorta and mild dilatation of the aortic root. Venous: The inferior vena cava was dilated in size with greater than 50% respiratory variablity.  LEFT VENTRICLE PLAX 2D (Teich) LV EF:          39.8 % LVIDd:          5.60 cm LVIDs:          4.50 cm LV PW:          1.00 cm LV IVS:         0.90 cm LVOT diam:      2.20 cm LV SV:          61 ml LVOT Area:      3.80 cm  RIGHT VENTRICLE RV S prime:     14.30 cm/s RVSP:           23.1 mmHg  LEFT ATRIUM  Index       RIGHT ATRIUM LA diam:        5.40 cm  2.59 cm/m  RA Pressure: 8 mmHg LA Vol (A2C):   108.0 ml 51.79 ml/m LA Vol (A4C):   80.3 ml  38.51 ml/m LA Biplane Vol: 96.1 ml  46.08 ml/m AORTIC VALVE LVOT Vmax:   90.45 cm/s LVOT Vmean:   58.500 cm/s LVOT VTI:    0.147 m  AORTA Ao Root diam: 3.90 cm Ao Asc diam:  4.15 cm  MR Peak grad: 101.2 mmHg  TR Peak grad: 15.1 mmHg MR Mean grad: 65.0 mmHg   TR Vmax:      236.00 cm/s MR Vmax:      503.00 cm/s RVSP:         23.1 mmHg MR Vmean:     379.0 cm/s MR PISA:      2.26   Armanda Magic MD Electronically signed by Armanda Magic MD Signature Date/Time: 03/17/2018/12:49:28 PM    Final   TEE  ECHO TEE 09/21/2016  Narrative *[place]* *[facility]* [address] [place], [place] [address] [phone]  ------------------------------------------------------------------- Transesophageal Echocardiography with Cardioversion  Patient:    Ardi, Alling MR #:       [id number] Study Date: 09/21/2016 Gender:     M Age:        70 Height:     170.2 cm Weight:     98.2 kg BSA:        2.19 m^2 Pt. Status: Room:       3W32C  ADMITTING    Thurmon Fair, MD ATTENDING    Thurmon Fair, MD PERFORMING   Thurmon Fair, MD Sallye Ober, Tiffany Nelly Laurence, Tiffany SONOGRAPHER  Sheralyn Boatman  cc:  ------------------------------------------------------------------- LV EF: 50% -   55%  ------------------------------------------------------------------- History:   PMH:  History of Pericarditis.  Atrial fibrillation. Risk factors:  Hypertension. Dyslipidemia.  ------------------------------------------------------------------- Study Conclusions  - Left ventricle: The cavity size was normal. Wall thickness was normal. Systolic function was at the lower limits of normal. The estimated ejection fraction was in the range of 50% to 55%. Wall motion was normal; there were no regional wall motion abnormalities. No evidence of thrombus. - Aortic valve: No evidence of vegetation. No evidence of vegetation. There was trivial regurgitation. - Mitral valve: No evidence of vegetation. There was mild to moderate regurgitation directed  centrally. - Left atrium: The atrium was severely dilated. No evidence of thrombus in the atrial cavity or appendage. No spontaneous echo contrast was observed. - Right atrium: The atrium was dilated. - Atrial septum: No defect or patent foramen ovale was identified. - Tricuspid valve: No evidence of vegetation. No evidence of vegetation. - Pulmonic valve: No evidence of vegetation.  Impressions:  - Successful cardioversion.  ------------------------------------------------------------------- Study data:   Study status:  Routine.  Consent:  The risks, benefits, and alternatives to the procedure were explained to the patient and informed consent was obtained.  Procedure:  The patient reported no pain pre or post test. Initial setup. The patient was brought to the laboratory in the fasting state. A baseline ECG was recorded. Intravenous access was obtained. Surface ECG leads and pulse oximetric signals were monitored. Self-adhesive anterior-posterior defibrillation pads were applied. Sedation. General anesthesia was administered during cardioversion by cardiology staff. Transesophageal echocardiography. Topical anesthesia was obtained using viscous lidocaine. An adult multiplane transesophageal probe was inserted by the attending cardiologistwithout difficulty. Image quality  was excellent. Images were captured in a quad screen format to simplify data comparison. No intracardiac thrombus was identified. . The rhythm was successfully converted from atrial fibrillation to normal sinus rhythm.  Study completion:  All IVs inserted during the procedure were removed. The patient tolerated the procedure well. There were no complications.          Transesophageal echocardiography with cardioversion.  2D and intravenous contrast injection.  Birthdate: Patient birthdate: [birth year].  Age:  Patient is 77 yr old.  Sex: Gender: male.    BMI: 33.9 kg/m^2.  Blood pressure:     107/83 Patient  status:  Inpatient.  Study date:  Study date: 09/21/2016. Study time: 12:07 PM.  Location:  Endoscopy.  -------------------------------------------------------------------  ------------------------------------------------------------------- Left ventricle:  The cavity size was normal. Wall thickness was normal. Systolic function was at the lower limits of normal. The estimated ejection fraction was in the range of 50% to 55%. Wall motion was normal; there were no regional wall motion abnormalities.  No evidence of thrombus.  ------------------------------------------------------------------- Aortic valve:   Structurally normal valve.   Cusp separation was normal.  No evidence of vegetation.  No evidence of vegetation. Doppler:  There was trivial regurgitation.  ------------------------------------------------------------------- Aorta:  The aorta was normal, not dilated, and non-diseased.  ------------------------------------------------------------------- Mitral valve:   Structurally normal valve.   Leaflet separation was normal.  No evidence of vegetation.  Doppler:  There was mild to moderate regurgitation directed centrally.  ------------------------------------------------------------------- Left atrium:  The atrium was severely dilated.  No evidence of thrombus in the atrial cavity or appendage. No spontaneous echo contrast was observed.  Emptying velocity was normal.  ------------------------------------------------------------------- Atrial septum:  No defect or patent foramen ovale was identified.  ------------------------------------------------------------------- Right ventricle:  The cavity size was normal. Wall thickness was normal. Systolic function was normal.  ------------------------------------------------------------------- Pulmonic valve:    Structurally normal valve.   Cusp separation was normal.  No evidence of vegetation.  Doppler:  There was  mild regurgitation.  ------------------------------------------------------------------- Tricuspid valve:   Structurally normal valve.   Leaflet separation was normal.  No evidence of vegetation.  No evidence of vegetation. Doppler:  There was trivial regurgitation.  ------------------------------------------------------------------- Pulmonary artery:   Systolic pressure was within the normal range.  ------------------------------------------------------------------- Right atrium:  The atrium was dilated.  ------------------------------------------------------------------- Pericardium:  There was no pericardial effusion.  ------------------------------------------------------------------- Post procedure conclusions Ascending Aorta:  - The aorta was normal, not dilated, and non-diseased.  ------------------------------------------------------------------- Measurements  Pulmonary arteries                           Value       Reference PA pressure, S, DP                           21    mm Hg <=30  Tricuspid valve                              Value       Reference Tricuspid regurg peak velocity               210.6 cm/s  --------- Tricuspid peak RV-RA gradient                18    mm Hg --------- Tricuspid maximal regurg velocity,  210.6 cm/s  --------- PISA  Systemic veins                               Value       Reference Estimated CVP                                3     mm Hg ---------  Right ventricle                              Value       Reference RV pressure, S, DP                           21    mm Hg <=30  Legend: (L)  and  (H)  mark values outside specified reference range.  ------------------------------------------------------------------- Prepared and Electronically Authenticated by  Thurmon Fair, MD 2018-08-10T19:46:43             Recent Labs: 12/21/2021: ALT 17 01/01/2022: BUN 19; Creatinine, Ser 0.98; Hemoglobin 14.6; Platelets 157;  Potassium 3.5; Sodium 139  Recent Lipid Panel    Component Value Date/Time   CHOL 144 09/13/2015 1041   CHOL 218 (H) 12/14/2014 0915   TRIG 106 09/14/2019 0350   TRIG 77 12/14/2014 0915   HDL 56 09/13/2015 1041   HDL 52 12/14/2014 0915   CHOLHDL 2.6 09/13/2015 1041   VLDL 12 09/13/2015 1041   LDLCALC 76 09/13/2015 1041   LDLCALC 151 (H) 12/14/2014 0915    Physical Exam:    VS:  BP 118/80   Pulse 61   Ht 5\' 7"  (1.702 m)   Wt 194 lb (88 kg)   SpO2 96%   BMI 30.38 kg/m     Wt Readings from Last 3 Encounters:  10/12/22 194 lb (88 kg)  09/05/22 195 lb (88.5 kg)  06/21/22 198 lb 8 oz (90 kg)    Gen: No acute distress HEENT: Normal CARDIAC: No murmur. IIRR no gallop, or edema. VASCULAR:  Normal Pulses. RESPIRATORY:  Clear to auscultation without rales, wheezing or rhonchi  ABDOMEN: Soft, non-tender, non-distended, No pulsatile mass, MUSCULOSKELETAL: No deformity  SKIN: Warm and dry NEUROLOGIC:  Alert and oriented x 3 PSYCHIATRIC:  Normal affect   ASSESSMENT:    1. Chronic atrial fibrillation (HCC)   2. Aneurysm of ascending aorta without rupture (HCC)   3. High coronary artery calcium score   4. Chronic anticoagulation   5. Essential hypertension     PLAN:    Permanent AF - BMP, dig level, CBC - continue current therapy  Mild aortic dilation - 43 mm, sees TCTS, Yearly CT  Myasthenia Gravis - no worsening on statin; will continue - not planned for 1 c agents BB or CCB due to MG - if worsening would do trial off digoxin  Non obstructive CAD - continue statin, DOAC no David  HTN - ramipril 5 mg controlled  One year with me    Medication Adjustments/Labs and Tests Ordered: Current medicines are reviewed at length with the patient today.  Concerns regarding medicines are outlined above.  Orders Placed This Encounter  Procedures   Basic metabolic panel   CBC   Digoxin level   EKG 12-Lead   No orders of the defined types were  placed in this  encounter.   Patient Instructions  Medication Instructions:  Your physician recommends that you continue on your current medications as directed. Please refer to the Current Medication list given to you today.  *If you need a refill on your cardiac medications before your next appointment, please call your pharmacy*   Lab Work: Digoxin,BMP, CBC  If you have labs (blood work) drawn today and your tests are completely normal, you will receive your results only by: MyChart Message (if you have MyChart) OR A paper copy in the mail If you have any lab test that is abnormal or we need to change your treatment, we will call you to review the results.   Testing/Procedures: NONE   Follow-Up: At [facility], you and your health needs are our priority.  As part of our continuing mission to provide you with exceptional heart care, we have created designated Provider Care Teams.  These Care Teams include your primary Cardiologist (physician) and Advanced Practice Providers (APPs -  Physician Assistants and Nurse Practitioners) who all work together to provide you with the care you need, when you need it.   Your next appointment:   1 year(s)  Provider:   Riley Lam, MD       Riley Lam, MD [facility] Cardiologist [facility]  [facility]  [address] [place], [address] [place], [place] [address] [phone]  11:55 AM

## 2022-10-12 ENCOUNTER — Ambulatory Visit: Payer: Medicare PPO | Admitting: Internal Medicine

## 2022-10-12 ENCOUNTER — Encounter: Payer: Self-pay | Admitting: Internal Medicine

## 2022-10-12 VITALS — BP 118/80 | HR 61 | Ht 67.0 in | Wt 194.0 lb

## 2022-10-12 DIAGNOSIS — Z7901 Long term (current) use of anticoagulants: Secondary | ICD-10-CM | POA: Diagnosis not present

## 2022-10-12 DIAGNOSIS — I7121 Aneurysm of the ascending aorta, without rupture: Secondary | ICD-10-CM | POA: Diagnosis not present

## 2022-10-12 DIAGNOSIS — I482 Chronic atrial fibrillation, unspecified: Secondary | ICD-10-CM

## 2022-10-12 DIAGNOSIS — I10 Essential (primary) hypertension: Secondary | ICD-10-CM | POA: Diagnosis not present

## 2022-10-12 DIAGNOSIS — R931 Abnormal findings on diagnostic imaging of heart and coronary circulation: Secondary | ICD-10-CM | POA: Diagnosis not present

## 2022-10-12 NOTE — Patient Instructions (Addendum)
Medication Instructions:  Your physician recommends that you continue on your current medications as directed. Please refer to the Current Medication list given to you today.  *If you need a refill on your cardiac medications before your next appointment, please call your pharmacy*   Lab Work: Digoxin,BMP, CBC  If you have labs (blood work) drawn today and your tests are completely normal, you will receive your results only by: MyChart Message (if you have MyChart) OR A paper copy in the mail If you have any lab test that is abnormal or we need to change your treatment, we will call you to review the results.   Testing/Procedures: NONE   Follow-Up: At [facility], you and your health needs are our priority.  As part of our continuing mission to provide you with exceptional heart care, we have created designated Provider Care Teams.  These Care Teams include your primary Cardiologist (physician) and Advanced Practice Providers (APPs -  Physician Assistants and Nurse Practitioners) who all work together to provide you with the care you need, when you need it.   Your next appointment:   1 year(s)  Provider:   Riley Lam, MD

## 2022-10-13 LAB — DIGOXIN LEVEL: Digoxin, Serum: 1.1 ng/mL — ABNORMAL HIGH (ref 0.5–0.9)

## 2022-10-13 LAB — CBC
Hematocrit: 44 % (ref 37.5–51.0)
Hemoglobin: 14.9 g/dL (ref 13.0–17.7)
MCH: 31.7 pg (ref 26.6–33.0)
MCHC: 33.9 g/dL (ref 31.5–35.7)
MCV: 94 fL (ref 79–97)
Platelets: 161 10*3/uL (ref 150–450)
RBC: 4.7 x10E6/uL (ref 4.14–5.80)
RDW: 12.7 % (ref 11.6–15.4)
WBC: 7.6 10*3/uL (ref 3.4–10.8)

## 2022-10-13 LAB — BASIC METABOLIC PANEL
BUN/Creatinine Ratio: 22 (ref 10–24)
BUN: 16 mg/dL (ref 8–27)
CO2: 24 mmol/L (ref 20–29)
Calcium: 9.3 mg/dL (ref 8.6–10.2)
Chloride: 102 mmol/L (ref 96–106)
Creatinine, Ser: 0.73 mg/dL — ABNORMAL LOW (ref 0.76–1.27)
Glucose: 86 mg/dL (ref 70–99)
Potassium: 3.8 mmol/L (ref 3.5–5.2)
Sodium: 139 mmol/L (ref 134–144)
eGFR: 93 mL/min/{1.73_m2} (ref >59)

## 2022-10-16 ENCOUNTER — Telehealth: Payer: Self-pay | Admitting: Neurology

## 2022-10-16 NOTE — Telephone Encounter (Signed)
   Patient Name: [name].  DOB: August 06, 1945 MRN: [id number]  Primary Cardiologist: Christell Constant, MD  Chart reviewed as part of pre-operative protocol coverage. Given past medical history and time since last visit, based on ACC/AHA guidelines, [name]. is at acceptable risk for the planned procedure without further cardiovascular testing.   Pt was last seen in office on 10/12/2022 by Dr. Izora Ribas. Pr Dr. Izora Ribas, "Stable- no barrires to colonoscopy."    Per office protocol, patient can hold Eliquis for 1-2 days prior to procedure. Please resume Eliquis as soon as possible postprocedure, at the discretion of the surgeon.    I will route this recommendation to the requesting party via Epic fax function and remove from pre-op pool.  Please call with questions.  Joylene Grapes, NP 10/16/2022, 7:25 AM

## 2022-10-16 NOTE — Telephone Encounter (Signed)
Left message for pt that he has been cleared and ok to hold Eliquis 1-2 days prior to procedure with Dr. Matthias Hughs. Resume once Dr. Matthias Hughs feels it is safe. I will update all parties involved.

## 2022-10-16 NOTE — Telephone Encounter (Signed)
[id number]:[id number] 09/18/22 [employer] auth: [id number] (exp. 09/18/22 to 01/24/23) EE

## 2022-10-17 ENCOUNTER — Encounter (INDEPENDENT_AMBULATORY_CARE_PROVIDER_SITE_OTHER): Payer: Medicare PPO | Admitting: Ophthalmology

## 2022-10-17 DIAGNOSIS — H43813 Vitreous degeneration, bilateral: Secondary | ICD-10-CM

## 2022-10-17 DIAGNOSIS — H35033 Hypertensive retinopathy, bilateral: Secondary | ICD-10-CM

## 2022-10-17 DIAGNOSIS — I10 Essential (primary) hypertension: Secondary | ICD-10-CM

## 2022-10-17 DIAGNOSIS — H353111 Nonexudative age-related macular degeneration, right eye, early dry stage: Secondary | ICD-10-CM

## 2022-10-17 DIAGNOSIS — H353221 Exudative age-related macular degeneration, left eye, with active choroidal neovascularization: Secondary | ICD-10-CM

## 2022-11-06 DIAGNOSIS — D124 Benign neoplasm of descending colon: Secondary | ICD-10-CM | POA: Diagnosis not present

## 2022-11-06 DIAGNOSIS — Z09 Encounter for follow-up examination after completed treatment for conditions other than malignant neoplasm: Secondary | ICD-10-CM | POA: Diagnosis not present

## 2022-11-06 DIAGNOSIS — K5730 Diverticulosis of large intestine without perforation or abscess without bleeding: Secondary | ICD-10-CM | POA: Diagnosis not present

## 2022-11-06 DIAGNOSIS — D123 Benign neoplasm of transverse colon: Secondary | ICD-10-CM | POA: Diagnosis not present

## 2022-11-06 DIAGNOSIS — Z86010 Personal history of colonic polyps: Secondary | ICD-10-CM | POA: Diagnosis not present

## 2022-11-08 DIAGNOSIS — D123 Benign neoplasm of transverse colon: Secondary | ICD-10-CM | POA: Diagnosis not present

## 2022-11-08 DIAGNOSIS — D124 Benign neoplasm of descending colon: Secondary | ICD-10-CM | POA: Diagnosis not present

## 2022-11-28 ENCOUNTER — Other Ambulatory Visit: Payer: Self-pay

## 2022-11-28 MED ORDER — DIGOXIN 250 MCG PO TABS: ORAL_TABLET | ORAL | 3 refills | Status: DC

## 2022-12-10 DIAGNOSIS — I700 Atherosclerosis of aorta: Secondary | ICD-10-CM | POA: Diagnosis not present

## 2022-12-10 DIAGNOSIS — E559 Vitamin D deficiency, unspecified: Secondary | ICD-10-CM | POA: Diagnosis not present

## 2022-12-10 DIAGNOSIS — E782 Mixed hyperlipidemia: Secondary | ICD-10-CM | POA: Diagnosis not present

## 2022-12-10 DIAGNOSIS — I2510 Atherosclerotic heart disease of native coronary artery without angina pectoris: Secondary | ICD-10-CM | POA: Diagnosis not present

## 2022-12-10 DIAGNOSIS — G7000 Myasthenia gravis without (acute) exacerbation: Secondary | ICD-10-CM | POA: Diagnosis not present

## 2022-12-10 DIAGNOSIS — I10 Essential (primary) hypertension: Secondary | ICD-10-CM | POA: Diagnosis not present

## 2022-12-10 DIAGNOSIS — I7121 Aneurysm of the ascending aorta, without rupture: Secondary | ICD-10-CM | POA: Diagnosis not present

## 2022-12-10 DIAGNOSIS — N3281 Overactive bladder: Secondary | ICD-10-CM | POA: Diagnosis not present

## 2022-12-10 DIAGNOSIS — I4819 Other persistent atrial fibrillation: Secondary | ICD-10-CM | POA: Diagnosis not present
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
## 2023-01-03 ENCOUNTER — Telehealth: Payer: Self-pay | Admitting: Neurology

## 2023-01-03 ENCOUNTER — Encounter: Payer: Self-pay | Admitting: Neurology

## 2023-01-03 ENCOUNTER — Ambulatory Visit: Payer: Medicare PPO | Admitting: Neurology

## 2023-01-03 VITALS — Ht 67.0 in | Wt 201.0 lb

## 2023-01-03 DIAGNOSIS — G7000 Myasthenia gravis without (acute) exacerbation: Secondary | ICD-10-CM | POA: Diagnosis not present

## 2023-01-03 MED ORDER — MYCOPHENOLATE MOFETIL 250 MG PO CAPS: 250.0000 mg | ORAL_CAPSULE | Freq: Two times a day (BID) | ORAL | 1 refills | Status: DC

## 2023-01-03 NOTE — Telephone Encounter (Signed)
Patient saw Dr. Frances Furbish in July, ordered PSG. Is patient ready to be scheduled? Thanks

## 2023-01-03 NOTE — Patient Instructions (Signed)
Check labs today Sleep department should reach out about scheduling sleep study, please let me know if you don't hear anything in the next 1-2 weeks.  We will continue Cellcept dosing for now. Use Mestinon as needed

## 2023-01-14 NOTE — Telephone Encounter (Signed)
[employer] updated auth: [id number] (exp. 01/14/23 to 04/11/23)   I left a voicemail on the patient phone I also left my phone number to call back  I also sent a mychart message.

## 2023-01-16 NOTE — Telephone Encounter (Signed)
Patient returned my call.  [employer]PS[employer]- [employer] Berkley Harvey: [id number] (exp. 01/14/23 to 04/11/23)   He is scheduled at [facility] for 02/18/23 at 9pm  Mailed packet to the patient.

## 2023-01-17 ENCOUNTER — Telehealth: Payer: Self-pay | Admitting: Internal Medicine

## 2023-01-17 DIAGNOSIS — R079 Chest pain, unspecified: Secondary | ICD-10-CM

## 2023-01-17 DIAGNOSIS — R0609 Other forms of dyspnea: Secondary | ICD-10-CM

## 2023-01-17 NOTE — Telephone Encounter (Signed)
I spoke with the pt and he would like to start the Pet stress test process... I will place the order and he will let us know until then if anything  changes or worsens.    Christell Constant, MD  Bertram Millard, RN Cc: Macie Burows, RN; P Cv [facility] Triage Caller: Unspecified (Today,  9:48 AM) PCP would be best served for PE rule out- would be low risk of blood thinners Atypical pain is described by nurse note. If persistent pain would consider NM Stress test (PET MPI)

## 2023-01-17 NOTE — Telephone Encounter (Signed)
   Pt c/o of Chest Pain: STAT if active CP, including tightness, pressure, jaw pain, radiating pain to shoulder/upper arm/back, CP unrelieved by Nitro. Symptoms reported of SOB, nausea, vomiting, sweating.  1. Are you having CP right now?   No - "chest discomfort"   2. Are you experiencing any other symptoms (ex. SOB, nausea, vomiting, sweating)?   Fatigue  3. Is your CP continuous or coming and going?   Coming and going   4. Have you taken Nitroglycerin?   No  5. How long have you been experiencing CP?   On and off for about a week  6. If NO CP at time of call then end call with telling Pt to call back or call 911 if Chest pain returns prior to return call from triage team.   Patient stated his oxygen sat has been low around 85-87%.  Patient stated yesterday he had a BP reading - 100/57.  Patient wants advice on next steps.

## 2023-01-17 NOTE — Telephone Encounter (Signed)
I spoke with the pt and his wife and they are reporting that the pt has been having worsening exertional chest pressure and dyspnea with exertion over the past 2 weeks.   He says at rest he checks his BP and O2 sat regularly and his BP has been consistent 120/70 but his O2 sat has been dropping into the upper 80's when typically under the same circumstances has been in the mid 90's.   They spoke with a physician family friend  last night and was suggested to talk to Dr Izora Ribas about possibly ruling out a PE... the pt has been taking his Eliquis regularly.   He does not have pain when taking in a deep breath and feels fairy well at rest.   I am unable to make the pt a near future appt and will send to Dr Izora Ribas to review.   Pt last stress test 2020 and "wnl".   I went over the need to consider the ED if his symptoms change/ worsen and the verbalized understanding.

## 2023-01-18 NOTE — Telephone Encounter (Signed)
Instructions for Cardiac PET Scan placed in a letter on my chart for pt to review.

## 2023-01-30 ENCOUNTER — Encounter (INDEPENDENT_AMBULATORY_CARE_PROVIDER_SITE_OTHER): Payer: Medicare PPO | Admitting: Ophthalmology

## 2023-01-30 DIAGNOSIS — I10 Essential (primary) hypertension: Secondary | ICD-10-CM | POA: Diagnosis not present

## 2023-01-30 DIAGNOSIS — H43813 Vitreous degeneration, bilateral: Secondary | ICD-10-CM | POA: Diagnosis not present

## 2023-01-30 DIAGNOSIS — H35033 Hypertensive retinopathy, bilateral: Secondary | ICD-10-CM

## 2023-01-30 DIAGNOSIS — H353221 Exudative age-related macular degeneration, left eye, with active choroidal neovascularization: Secondary | ICD-10-CM | POA: Diagnosis not present

## 2023-01-30 DIAGNOSIS — H353111 Nonexudative age-related macular degeneration, right eye, early dry stage: Secondary | ICD-10-CM

## 2023-02-15 ENCOUNTER — Other Ambulatory Visit: Payer: Self-pay | Admitting: *Deleted

## 2023-02-15 ENCOUNTER — Other Ambulatory Visit (HOSPITAL_COMMUNITY): Payer: Self-pay

## 2023-02-15 DIAGNOSIS — I482 Chronic atrial fibrillation, unspecified: Secondary | ICD-10-CM

## 2023-02-15 MED ORDER — APIXABAN 5 MG PO TABS
5.0000 mg | ORAL_TABLET | Freq: Two times a day (BID) | ORAL | 1 refills | Status: DC
  Filled 2023-02-15: qty 180, 90d supply, fill #0

## 2023-02-15 NOTE — Telephone Encounter (Signed)
 Eliquis 5mg  refill request received. Patient is 78 years old, weight-91.2kg, Crea-0.73 on 10/12/22, Diagnosis-Afib, and last seen by Dr. Dagmar Hait on 10/12/22. Dose is appropriate based on dosing criteria. Will send in refill to requested pharmacy.

## 2023-02-18 ENCOUNTER — Ambulatory Visit: Payer: Medicare PPO | Admitting: Neurology

## 2023-02-18 DIAGNOSIS — E66811 Obesity, class 1: Secondary | ICD-10-CM

## 2023-02-18 DIAGNOSIS — G7000 Myasthenia gravis without (acute) exacerbation: Secondary | ICD-10-CM

## 2023-02-18 DIAGNOSIS — R9431 Abnormal electrocardiogram [ECG] [EKG]: Secondary | ICD-10-CM

## 2023-02-18 DIAGNOSIS — R0683 Snoring: Secondary | ICD-10-CM

## 2023-02-18 DIAGNOSIS — G4719 Other hypersomnia: Secondary | ICD-10-CM

## 2023-02-18 DIAGNOSIS — Z9189 Other specified personal risk factors, not elsewhere classified: Secondary | ICD-10-CM

## 2023-02-18 DIAGNOSIS — I482 Chronic atrial fibrillation, unspecified: Secondary | ICD-10-CM

## 2023-02-18 DIAGNOSIS — Z820 Family history of epilepsy and other diseases of the nervous system: Secondary | ICD-10-CM

## 2023-02-18 DIAGNOSIS — G4720 Circadian rhythm sleep disorder, unspecified type: Secondary | ICD-10-CM

## 2023-02-18 DIAGNOSIS — G4761 Periodic limb movement disorder: Secondary | ICD-10-CM

## 2023-02-19 ENCOUNTER — Other Ambulatory Visit: Payer: Self-pay | Admitting: *Deleted

## 2023-02-19 ENCOUNTER — Other Ambulatory Visit (HOSPITAL_COMMUNITY): Payer: Self-pay

## 2023-02-19 DIAGNOSIS — I482 Chronic atrial fibrillation, unspecified: Secondary | ICD-10-CM

## 2023-02-19 MED ORDER — APIXABAN 5 MG PO TABS: 5.0000 mg | ORAL_TABLET | Freq: Two times a day (BID) | ORAL | 1 refills | Status: DC

## 2023-02-19 NOTE — Telephone Encounter (Signed)
 Eliquis 5mg  refill request received. Patient is 78 years old, weight-91.2kg, Crea-0.73 on 10/12/22, Diagnosis-Afib, and last seen by Dr. Izora Ribas on 10/12/22. Dose is appropriate based on dosing criteria. Will send in refill to requested pharmacy.

## 2023-02-20 NOTE — Procedures (Signed)
 Physician Interpretation:     [facility] POLYSOMNOGRAPHY  INTERPRETATION REPORT   STUDY DATE:  02/18/2023     PATIENT NAME:  [name]         DATE OF BIRTH:  Apr 09, 1945  PATIENT ID:  [id number]    TYPE OF STUDY:  PSG  READING PHYSICIAN: True Mar, MD, PhD   SCORING TECHNICIAN: Donnice Counts, RPSGT   Referred by: Dr. Modena Callander ? History and Indication for Testing: 78 year old male with an underlying complex medical history of myasthenia gravis, Bell's palsy, history of pericarditis, A-fib, ascending aortic aneurysm (followed by CT surgery), hyperlipidemia, reflux disease, hypertension, and borderline obesity, who reports snoring and excessive daytime somnolence. His Epworth sleepiness score is 4 out of 24, fatigue severity score is 31 out of 63. Height: 67 in Weight: 195 lb (BMI 30) Neck Size: 15 in    MEDICATIONS: Alpha-Lipoic Acid, Eliquis , Lipitor, Vitamin D3, CoQ-10, Lanoxin , Gemtesa, Centrum Silver, Cellcept , Omega 3, Mestinon , Altace , Resveratrol, Flomax , Maxzide , Turmeric   TECHNICAL DESCRIPTION: A registered sleep technologist was in attendance for the duration of the recording.  Data collection, scoring, video monitoring, and reporting were performed in compliance with the AASM Manual for the Scoring of Sleep and Associated Events; (Hypopnea is scored based on the criteria listed in Section VIII D. 1b in the AASM Manual V2.6 using a 4% oxygen desaturation rule or Hypopnea is scored based on the criteria listed in Section VIII D. 1a in the AASM Manual V2.6 using 3% oxygen desaturation and /or arousal rule).   SLEEP CONTINUITY AND SLEEP ARCHITECTURE:  Lights-out was at 21:43: and lights-on at  05:23:, with a total recording time of 7 hours, 40 min. Total sleep time ( TST) was 341.5 minutes with a decreased sleep efficiency at 74.2%. There was  22.3% REM sleep.   BODY POSITION:  TST was divided  between the following sleep positions:  55.9% supine;  44.1% lateral;  0% prone. Duration of total sleep and percent of total sleep in their respective position is as follows: supine 191 minutes (56%), non-supine 151 minutes (44%); right 22 minutes (7%), left 128 minutes (37%), and prone 00 minutes (0%).  Total supine REM sleep time was 00 minutes (0% of total REM sleep).  Sleep latency was normal at 15.5 minutes.  REM sleep latency was increased at 135.0 minutes. Of the total sleep time, the percentage of stage N1 sleep was 14.3%, which is increased, stage N2 sleep was 63%, which is increased, stage N3 sleep was absent, and REM sleep was 22.3%, which is normal. Wake after sleep onset (WASO) time accounted for 103 minutes, with mild to moderate sleep fragmentation noted.   RESPIRATORY MONITORING:   Based on CMS criteria (using a 4% oxygen desaturation rule for scoring hypopneas), there were 2 apneas (2 obstructive; 0 central; 0 mixed), and 17 hypopneas.  Apnea index was 0.4. Hypopnea index was 3.0. The apnea-hypopnea index was 3.3/hour overall (2.8 supine, 3 non-supine; 3.2 REM, 0.0 supine REM).  There were 0 respiratory effort-related arousals (RERAs).  The RERA index was 0 events/h. Total respiratory disturbance index (RDI) was 3.3 events/h. RDI results showed: supine RDI  2.8 /h; non-supine RDI 4.0 /h; REM RDI 3.2 /h, supine REM RDI 0.0 /h.   Based on AASM criteria (using a 3% oxygen desaturation and /or arousal rule for scoring hypopneas), there were 2 apneas (2 obstructive; 0 central; 0 mixed), and 34 hypopneas. Apnea index was 0.4. Hypopnea index was 6.0.  The apnea-hypopnea index was 6.3 overall (6.0 supine, 4 non-supine; 3.9 REM, 0.0 supine REM).  There were 0 respiratory effort-related arousals (RERAs).  The RERA index was 0 events/h. Total respiratory disturbance index (RDI) was 6.3 events/h. RDI results showed: supine RDI  6.0 /h; non-supine RDI 6.8 /h; REM RDI 3.9 /h, supine REM RDI 0.0 /h.   OXIMETRY: Oxyhemoglobin Saturation Nadir  during sleep was at  89% from a mean of 95%.  Of the Total sleep time (TST)   hypoxemia (=<88%) was present for  0.0 minutes, or 0.0% of total sleep time.   LIMB MOVEMENTS: There were 250 periodic limb movements of sleep (43.9/hr), of which 15 (2.6/hr) were associated with an arousal.   AROUSAL: There were 48 arousals in total, for an arousal index of 8 arousals/hour.  Of these, 6 were identified as respiratory-related arousals (1 /h), 15 were PLM-related arousals (3 /h), and 39 were non-specific arousals (7 /h).   EEG: Review of the EEG showed no abnormal electrical discharges and symmetrical bihemispheric findings.    EKG: The EKG revealed an irregular rhythm, in keeping with atrial fibrillation and frequent PVCs noted. The average heart rate during sleep was 62 bpm.   AUDIO/VIDEO REVIEW: The audio and video review did not show any abnormal or unusual behaviors, movements, phonations or vocalizations. The patient took no restroom breaks. Snoring was noted, in the mild to moderate range, at times intermittent.   POST-STUDY QUESTIONNAIRE: Post study, the patient indicated, that sleep was worse than usual.   IMPRESSION:   1. Primary Snoring 2. PLMD (periodic limb movement disorder [of sleep]) 3. Dysfunctions associated with sleep stages or arousal from sleep 4. Non-specific abnormal electrocardiogram (EKG)  RECOMMENDATIONS:   1. This study does not demonstrate any significant obstructive or central sleep disordered breathing with an AHI of less than 5/hour - his AHI was 3.3/hour - and oxygen desaturations nadir of 89%. Mild to moderate intermittent snoring was noted. Treatment with a positive airway pressure device, such as CPAP or autoPAP is not indicated. Some weight loss and avoidance of the supine sleep position may aid in reducing the snoring.  2. This study shows sleep fragmentation and abnormal sleep stage percentages; these are nonspecific findings and per se do not signify an  intrinsic sleep disorder or a cause for the patient's sleep-related symptoms. Causes include (but are not limited to) the first night effect of the sleep study, circadian rhythm disturbances, medication effect or an underlying mood disorder or medical problem.  3. Moderate PLMs (periodic limb movements of sleep) were noted during this study without significant arousals; clinical correlation is recommended. 4. The study showed evidence of atrial fibrillation and frequent PVCs on single lead EKG; clinical correlation is recommended. The patient is well-known to cardiology.   5. The patient should be cautioned not to drive, work at heights, or operate dangerous or heavy equipment when tired or sleepy. Review and reiteration of good sleep hygiene measures should be pursued with any patient. 6. The patient will be advised to follow up with the referring provider, who will be notified of the test results.   I certify that I have reviewed the entire raw data recording prior to the issuance of this report in accordance with the Standards of Accreditation of the American Academy of Sleep Medicine (AASM).  True Mar, MD, PhD Medical Director, [facility]) Diplomat, ABPN (Neurology and Sleep)  Technical Report:   General Information  Name: [name], [name]. BMI: 30.54 Physician: True Mar, MD  ID: [id number] Height: 67.0 in Technician: Donnice Counts, RPSGT  Sex: Male Weight: 195.0 lb Record: [id number]  Age: 64 [[birth year]] Date: 02/18/2023    Medical & Medication History    [name] is a 78 year old male with an underlying complex medical history of myasthenia gravis, Bell's palsy, history of pericarditis, A-fib, ascending aortic aneurysm (followed by CT surgery), hyperlipidemia, reflux disease, hypertension, and borderline obesity, who reports snoring and excessive daytime somnolence. In the past several months his fatigue  has increased and he feels tired in the afternoons, typically has to take a nap, currently naps daily. His Epworth sleepiness score is 4 out of 24, fatigue severity score is 31 out of 63. His brother has a history of sleep apnea. He suspects that his dad had sleep apnea as well. Patient lives with his wife, daughter lives in [place] , he has 3 grandchildren and 3 great-grandchildren. Some 10 years ago, in or around 2014 he had a sleep study through [facility] sleep and it was negative for sleep apnea at the time. He has since then lost about 50 pounds over the course of 10 years. Bedtime is generally between 11 and midnight and rise time between 5 and 6. He has no nightly nocturia and denies any nocturnal or recurrent morning headaches. He had a tonsillectomy as a child. He drinks no daily caffeine, alcohol very occasionally to rarely, quit smoking in the past I some 30 years ago.  Alpha-Lipoic Acid, Eliquis , Lipitor, Vitamin D3, CoQ-10, Lanoxin , Gemtesa, Centrum Silver, Cellcept , Omega 3, Mestinon , Altace , Resveratrol, Flomax , Maxzide , Turmeric   Sleep Disorder      Comments   Patient arrived for a diagnostic polysomnogram. Procedure explained and all questions answered. paste setup without complications. Patient slept supine, left, and right. Mild to moderate snoring was noted. Respiratory events observed, worse while supine and in REM sleep. Cardiac arrhythmias noted, to include sinus pauses, PVC's and possibly some paroxysmal A-Fib. Patient has a known cardiac history. PLMS observed. No nocturia.    Lights out: 09:43:25 PM Lights on: 05:23:20 AM   Time Total Supine Side Prone Upright  Recording (TRT) 7h 40.77m 4h 30.87m 3h 9.47m 0h 0.32m 0h 0.65m  Sleep (TST) 5h 41.33m 3h 11.64m 2h 30.36m 0h 0.6m 0h 0.58m   Latency N1 N2 N3 REM Onset Per. Slp. Eff.  Actual 0h 0.63m 0h 3.33m 0h 0.28m 2h 15.93m 0h 15.37m 0h 57.26m 74.24%   Stg Dur Wake N1 N2 N3 REM  Total 118.5 49.0 216.5 0.0 76.0  Supine 79.5 33.5 157.5 0.0 0.0   Side 39.0 15.5 59.0 0.0 76.0  Prone 0.0 0.0 0.0 0.0 0.0  Upright 0.0 0.0 0.0 0.0 0.0   Stg % Wake N1 N2 N3 REM  Total 25.8 14.3 63.4 0.0 22.3  Supine 17.3 9.8 46.1 0.0 0.0  Side 8.5 4.5 17.3 0.0 22.3  Prone 0.0 0.0 0.0 0.0 0.0  Upright 0.0 0.0 0.0 0.0 0.0     Apnea Summary Sub Supine Side Prone Upright  Total 2 Total 2 0 2 0 0    REM 1 0 1 0 0    NREM 1 0 1 0 0  Obs 2 REM 1 0 1 0 0    NREM 1 0 1 0 0  Mix 0 REM 0 0 0 0 0    NREM 0 0 0 0 0  Cen 0 REM 0 0 0 0  0    NREM 0 0 0 0 0   Rera Summary Sub Supine Side Prone Upright  Total 0 Total 0 0 0 0 0    REM 0 0 0 0 0    NREM 0 0 0 0 0   Hypopnea Summary Sub Supine Side Prone Upright  Total 34 Total 34 19 15 0 0    REM 4 0 4 0 0    NREM 30 19 11  0 0   4% Hypopnea Summary Sub Supine Side Prone Upright  Total (4%) 17 Total 17 9 8  0 0    REM 3 0 3 0 0    NREM 14 9 5  0 0     AHI Total Obs Mix Cen  6.33 Apnea 0.35 0.35 0.00 0.00   Hypopnea 5.97 -- -- --  3.34 Hypopnea (4%) 2.99 -- -- --    Total Supine Side Prone Upright  Position AHI 6.33 5.97 6.78 0.00 0.00  REM AHI 3.95   NREM AHI 7.01   Position RDI 6.33 5.97 6.78 0.00 0.00  REM RDI 3.95   NREM RDI 7.01    4% Hypopnea Total Supine Side Prone Upright  Position AHI (4%) 3.34 2.83 3.99 0.00 0.00  REM AHI (4%) 3.16   NREM AHI (4%) 3.39   Position RDI (4%) 3.34 2.83 3.99 0.00 0.00  REM RDI (4%) 3.16   NREM RDI (4%) 3.39    Desaturation Information Threshold: 2% <100% <90% <80% <70% <60% <50% <40%  Supine 96.0 1.0 0.0 0.0 0.0 0.0 0.0  Side 58.0 0.0 0.0 0.0 0.0 0.0 0.0  Prone 0.0 0.0 0.0 0.0 0.0 0.0 0.0  Upright 0.0 0.0 0.0 0.0 0.0 0.0 0.0  Total 154.0 1.0 0.0 0.0 0.0 0.0 0.0  Index 21.0 0.1 0.0 0.0 0.0 0.0 0.0   Threshold: 3% <100% <90% <80% <70% <60% <50% <40%  Supine 33.0 1.0 0.0 0.0 0.0 0.0 0.0  Side 23.0 0.0 0.0 0.0 0.0 0.0 0.0  Prone 0.0 0.0 0.0 0.0 0.0 0.0 0.0  Upright 0.0 0.0 0.0 0.0 0.0 0.0 0.0  Total 56.0 1.0 0.0 0.0 0.0 0.0 0.0  Index 7.6 0.1 0.0  0.0 0.0 0.0 0.0   Threshold: 4% <100% <90% <80% <70% <60% <50% <40%  Supine 12.0 1.0 0.0 0.0 0.0 0.0 0.0  Side 12.0 0.0 0.0 0.0 0.0 0.0 0.0  Prone 0.0 0.0 0.0 0.0 0.0 0.0 0.0  Upright 0.0 0.0 0.0 0.0 0.0 0.0 0.0  Total 24.0 1.0 0.0 0.0 0.0 0.0 0.0  Index 3.3 0.1 0.0 0.0 0.0 0.0 0.0   Threshold: 3% <100% <90% <80% <70% <60% <50% <40%  Supine 33 1 0 0 0 0 0  Side 23 0 0 0 0 0 0  Prone 0 0 0 0 0 0 0  Upright 0 0 0 0 0 0 0  Total 56 1 0 0 0 0 0   Awakening/Arousal Information # of Awakenings 34  Wake after sleep onset 103.58m  Wake after persistent sleep 89.36m   Arousal Assoc. Arousals Index  Apneas 0 0.0  Hypopneas 6 1.1  Leg Movements 21 3.7  Snore 0 0.0  PTT Arousals 0 0.0  Spontaneous 39 6.9  Total 66 11.6  Leg Movement Information PLMS LMs Index  Total LMs during PLMS 250 43.9  LMs w/ Microarousals 15 2.6   LM LMs Index  w/ Microarousal 6 1.1  w/ Awakening 6 1.1  w/ Resp Event 0 0.0  Spontaneous 25  4.4  Total 31 5.4     Desaturation threshold setting: 3% Minimum desaturation setting: 10 seconds SaO2 nadir: 89% The longest event was a 31 sec obstructive Hypopnea with a minimum SaO2 of 93%. The lowest SaO2 was 89% associated with a 21 sec obstructive Hypopnea. EKG Rates EKG Avg Max Min  Awake 65 87 51  Asleep 62 100 47  EKG Events: Tachycardia

## 2023-02-25 ENCOUNTER — Other Ambulatory Visit (HOSPITAL_COMMUNITY): Payer: Self-pay

## 2023-03-12 ENCOUNTER — Ambulatory Visit (HOSPITAL_COMMUNITY): Payer: Medicare PPO

## 2023-03-22 ENCOUNTER — Other Ambulatory Visit: Payer: Self-pay | Admitting: Thoracic Surgery (Cardiothoracic Vascular Surgery)

## 2023-03-22 DIAGNOSIS — I712 Thoracic aortic aneurysm, without rupture, unspecified: Secondary | ICD-10-CM

## 2023-04-16 ENCOUNTER — Encounter: Payer: Self-pay | Admitting: Thoracic Surgery (Cardiothoracic Vascular Surgery)

## 2023-04-18 ENCOUNTER — Encounter (HOSPITAL_COMMUNITY): Payer: Self-pay

## 2023-04-19 NOTE — Progress Notes (Unsigned)
 [address]       [place] [address]             [phone]        Han Vejar [id number] 1945-02-22  History of Present Illness: [name]. Is a 78 year old male with a past medical history of permanent atrial fibrillation, chronic anticoagulation, essential hypertension, hyperlipidemia, myasthenia gravis and nonobstructive CAD by cath 2002 (LAD luminal irregularities), and an ascending aortic aneurysm. His aorta in 2004 measured about 4.1cm and in 2016 measured around 4.3cm based on past scans measured by Dr. Dorris Fetch. We have seen the patient in our clinic since 2022 and his ascending aortic aneurysm has remained stable at about 4.4-4.5cm.   He denies a family history of TAA and personal history of connective tissue disorder.  Today the patient reports occasional chest pain when walking the dogs (tightness) unsure if this is from myasthenia gravis or his heart. Denies shortness of breath, leg swelling, dizziness and LOC. He does admit to orthostatic hypotension.   Current Outpatient Medications on File Prior to Visit  Medication Sig Dispense Refill   Alpha-Lipoic Acid 300 MG CAPS Take 300 mg by mouth daily.      apixaban (ELIQUIS) 5 MG TABS tablet Take 1 tablet (5 mg total) by mouth 2 (two) times daily. 180 tablet 1   ascorbic acid (VITAMIN C) 1000 MG tablet daily.     atorvastatin (LIPITOR) 20 MG tablet TAKE 1 TABLET BY MOUTH DAILY 30 tablet 11   cholecalciferol (VITAMIN D3) 25 MCG (1000 UNIT) tablet Take 1,000 Units by mouth daily.     Coenzyme Q10 (COQ-10) 100 MG CAPS Take 100 mg by mouth daily.      digoxin (LANOXIN) 0.25 MG tablet TAKE 1 TABLET(0.25 MG) BY MOUTH DAILY 90 tablet 3   GEMTESA 75 MG TABS Take by mouth.     Multiple Vitamins-Minerals (CENTRUM SILVER PO) Take 1 tablet by mouth daily.     mycophenolate (CELLCEPT) 250 MG capsule Take 1 capsule (250 mg total) by mouth 2 (two) times daily. 180 capsule 1   Omega-3 Fatty Acids  (OMEGA-3 PO) Take 1,040 mg by mouth daily.     pyridostigmine (MESTINON) 60 MG tablet Take 1 tablet (60 mg total) by mouth 3 (three) times daily as needed. (Patient taking differently: Take 60 mg by mouth as needed.) 30 tablet 11   ramipril (ALTACE) 5 MG capsule Take 5 mg by mouth daily.     Resveratrol 100 MG CAPS Take 100 mg by mouth 2 (two) times daily.      tamsulosin (FLOMAX) 0.4 MG CAPS capsule Take 0.4 mg by mouth daily.     triamterene-hydrochlorothiazide (MAXZIDE-25) 37.5-25 MG tablet Take 1 tablet by mouth daily.     TURMERIC PO Take 100 mg by mouth 2 (two) times daily.     No current facility-administered medications on file prior to visit.   Vitals: Today's Vitals   04/30/23 1420  BP: 128/74  Pulse: 70  Resp: 18  SpO2: 96%  Weight: 199 lb (90.3 kg)  Height: 5\' 7"  (1.702 m)   Body mass index is 31.17 kg/m.  Physical Exam General: Alert and oriented, no acute distress Neuro: Grossly intact CV: irregularly irregular rhythm, no murmur Pulm: Clear to auscultation bilaterally GI: +BS, nontender, no distension Extremities: Trace edema BLE  CTA Results: CLINICAL DATA:  Aortic aneurysm suspected.   EXAM: CT ANGIOGRAPHY CHEST WITH CONTRAST   TECHNIQUE: Multidetector  CT imaging of the chest was performed using the standard protocol during bolus administration of intravenous contrast. Multiplanar CT image reconstructions and MIPs were obtained to evaluate the vascular anatomy.   RADIATION DOSE REDUCTION: This exam was performed according to the departmental dose-optimization program which includes automated exposure control, adjustment of the mA and/or kV according to patient size and/or use of iterative reconstruction technique.   CONTRAST:  75mL ISOVUE-370 IOPAMIDOL (ISOVUE-370) INJECTION 76%   COMPARISON:  Chest CT dated 04/16/2022.   FINDINGS: Cardiovascular: There is no cardiomegaly or pericardial effusion. There is advanced coronary vascular calcification.  Mild dilatation of the ascending aorta measure up to 4.3 cm. There is mild atherosclerotic calcification of the thoracic aorta. No aortic dissection. The origins of the great vessels of the aortic arch are patent. No pulmonary artery embolus identified.   Mediastinum/Nodes: There is no hilar or mediastinal adenopathy. The esophagus is grossly unremarkable. No mediastinal fluid collection.   Lungs/Pleura: No focal consolidation, pleural effusion, or pneumothorax. The central airways are patent.   Upper Abdomen: No acute abnormality.   Musculoskeletal: Osteopenia with degenerative changes. No acute osseous pathology.   Review of the MIP images confirms the above findings.   IMPRESSION: 1. No acute intrathoracic pathology. No CT evidence of pulmonary artery embolus. 2. Mild dilatation of the ascending aorta measure up to 4.3 cm. Recommend annual imaging followup by CTA or MRA. This recommendation follows 2010 ACCF/AHA/AATS/ACR/ASA/SCA/SCAI/SIR/STS/SVM Guidelines for the Diagnosis and Management of Patients with Thoracic Aortic Disease. Circulation. 2010; 121: E454-U981. Aortic aneurysm NOS (ICD10-I71.9)     Electronically Signed   By: Elgie Collard M.D.   On: 04/24/2023 17:08  Impression and Plan: Thoracic aortic aneurysm: Mr. Duzan presents to the clinic with a stable 4.3 cm ascending aortic aneurysm.  Echocardiogram shows a tricuspid aortic valve without evidence of regurgitation. We discussed the natural history and and risk factors for growth of ascending aortic aneurysms. We covered the importance of tight blood pressure control, refraining from lifting heavy objects, and avoiding fluoroquinolones. The patient is aware of signs and symptoms of aortic dissection and when to present to the emergency department. There is no indication for surgery at this time since his aneurysm does not measure 5.5cm or more. We will continue surveillance, the patient will return to the clinic  with a CTA in 1 year  Hypertension: Uses cardia and measures his BP twice per day, average BP 115/70. Continue BP monitoring and PCP follow ups.   Aortic atherosclerosis: Continue Atorvastatin. Follows up with cardiology. Echocardiogram scheduled for 4/10.  Risk Modification:  Statin:  Atorvastatin  Smoking cessation instruction/counseling given:  Nonsmoker  Patient was counseled on importance of Blood Pressure Control.  Despite Medical intervention if the patient notices persistently elevated blood pressure readings.  They are instructed to contact their Primary Care Physician  Please avoid use of Fluoroquinolones as this can potentially increase your risk of Aortic Rupture and/or Dissection  Patient educated on signs and symptoms of Aortic Dissection, handout also provided in AVS  Jenny Reichmann, PA-C 04/19/23

## 2023-04-23 ENCOUNTER — Encounter (HOSPITAL_COMMUNITY)
Admission: RE | Admit: 2023-04-23 | Discharge: 2023-04-23 | Disposition: A | Discharge status: Home / Self Care | Payer: Medicare PPO | Source: Ambulatory Visit | Attending: Internal Medicine | Admitting: Internal Medicine

## 2023-04-23 DIAGNOSIS — R0609 Other forms of dyspnea: Secondary | ICD-10-CM | POA: Insufficient documentation

## 2023-04-23 DIAGNOSIS — R079 Chest pain, unspecified: Secondary | ICD-10-CM | POA: Diagnosis not present

## 2023-04-23 LAB — NM PET CT CARDIAC PERFUSION MULTI W/ABSOLUTE BLOODFLOW
LV dias vol: 108 mL (ref 62–150)
LV sys vol: 48 mL
MBFR: 3.02
Rest MBF: 0.58 ml/g/min
Rest Nuclear Isotope Dose: 23.7 mCi
ST Depression (mm): 0 mm
Stress MBF: 1.75 ml/g/min
Stress Nuclear Isotope Dose: 23.7 mCi
TID: 1.4

## 2023-04-23 MED ORDER — REGADENOSON 0.4 MG/5ML IV SOLN
INTRAVENOUS | Status: AC
  Filled 2023-04-23: qty 5

## 2023-04-23 MED ORDER — REGADENOSON 0.4 MG/5ML IV SOLN
0.4000 mg | Freq: Once | INTRAVENOUS | Status: AC
  Administered 2023-04-23: 0.4 mg via INTRAVENOUS

## 2023-04-23 MED ORDER — RUBIDIUM RB82 GENERATOR (RUBYFILL)
23.6900 | PACK | Freq: Once | INTRAVENOUS | Status: AC
  Administered 2023-04-23: 23.69 via INTRAVENOUS

## 2023-04-23 MED ORDER — RUBIDIUM RB82 GENERATOR (RUBYFILL)
23.6500 | PACK | Freq: Once | INTRAVENOUS | Status: AC
  Administered 2023-04-23: 23.65 via INTRAVENOUS

## 2023-04-24 ENCOUNTER — Ambulatory Visit
Admission: RE | Admit: 2023-04-24 | Discharge: 2023-04-24 | Disposition: A | Discharge status: Home / Self Care | Payer: Medicare PPO | Source: Ambulatory Visit | Attending: Thoracic Surgery (Cardiothoracic Vascular Surgery) | Admitting: Thoracic Surgery (Cardiothoracic Vascular Surgery)

## 2023-04-24 DIAGNOSIS — I77810 Thoracic aortic ectasia: Secondary | ICD-10-CM | POA: Diagnosis not present

## 2023-04-24 DIAGNOSIS — I712 Thoracic aortic aneurysm, without rupture, unspecified: Secondary | ICD-10-CM

## 2023-04-24 MED ORDER — IOPAMIDOL (ISOVUE-370) INJECTION 76%
75.0000 mL | Freq: Once | INTRAVENOUS | Status: AC | PRN
  Administered 2023-04-24: 75 mL via INTRAVENOUS

## 2023-04-25 ENCOUNTER — Telehealth: Payer: Self-pay

## 2023-04-25 DIAGNOSIS — I7121 Aneurysm of the ascending aorta, without rupture: Secondary | ICD-10-CM

## 2023-04-25 NOTE — Telephone Encounter (Signed)
-----   Message from Armanda Magic sent at 04/24/2023  5:48 PM EDT ----- No ischemia on stress test with normal heart function and normal myocardial blood flow reserve.  There is coronary calcium noted in the LAD and left circumflex.  Noncardiac portion of chest CT showed bronchial wall thickening possibly due to inflammatory bronchitis and unchanged dilated ascending aorta at 4.4 cm.  Main pulmonary artery appeared enlarged and possible cardiomegaly..  Please order 2D echo for further assessment of pulmonary artery and assess LV size.  Forward findings of noncardiac issues to PCP to follow-up on.  Repeat chest CT a gated in 1 year for dilated ascending aorta

## 2023-04-25 NOTE — Telephone Encounter (Signed)
 Left voicemail to return call to office

## 2023-04-25 NOTE — Telephone Encounter (Signed)
 Spoke with patient and he is aware of CT results and provider recommendations. 2D echo ordered

## 2023-04-25 NOTE — Telephone Encounter (Signed)
Follow Up:     Patientis returning a call from this morning.

## 2023-04-30 ENCOUNTER — Ambulatory Visit: Payer: Medicare PPO | Admitting: Physician Assistant

## 2023-04-30 VITALS — BP 128/74 | HR 70 | Resp 18 | Ht 67.0 in | Wt 199.0 lb

## 2023-04-30 DIAGNOSIS — I712 Thoracic aortic aneurysm, without rupture, unspecified: Secondary | ICD-10-CM | POA: Diagnosis not present

## 2023-04-30 NOTE — Patient Instructions (Signed)

## 2023-05-17 DIAGNOSIS — J019 Acute sinusitis, unspecified: Secondary | ICD-10-CM | POA: Diagnosis not present

## 2023-05-17 DIAGNOSIS — R059 Cough, unspecified: Secondary | ICD-10-CM | POA: Diagnosis not present

## 2023-05-22 ENCOUNTER — Encounter (INDEPENDENT_AMBULATORY_CARE_PROVIDER_SITE_OTHER): Payer: Medicare PPO | Admitting: Ophthalmology

## 2023-05-23 ENCOUNTER — Ambulatory Visit (HOSPITAL_COMMUNITY): Attending: Cardiology

## 2023-05-23 DIAGNOSIS — I7121 Aneurysm of the ascending aorta, without rupture: Secondary | ICD-10-CM | POA: Diagnosis not present

## 2023-05-23 DIAGNOSIS — I517 Cardiomegaly: Secondary | ICD-10-CM

## 2023-05-23 LAB — ECHOCARDIOGRAM COMPLETE: S' Lateral: 3 cm

## 2023-05-24 ENCOUNTER — Encounter: Payer: Self-pay | Admitting: Cardiology

## 2023-05-24 DIAGNOSIS — I340 Nonrheumatic mitral (valve) insufficiency: Secondary | ICD-10-CM | POA: Insufficient documentation

## 2023-06-10 ENCOUNTER — Encounter (INDEPENDENT_AMBULATORY_CARE_PROVIDER_SITE_OTHER): Payer: Self-pay | Admitting: Internal Medicine

## 2023-06-10 ENCOUNTER — Encounter (INDEPENDENT_AMBULATORY_CARE_PROVIDER_SITE_OTHER): Admitting: Ophthalmology

## 2023-06-10 DIAGNOSIS — I10 Essential (primary) hypertension: Secondary | ICD-10-CM

## 2023-06-10 DIAGNOSIS — H35033 Hypertensive retinopathy, bilateral: Secondary | ICD-10-CM

## 2023-06-10 DIAGNOSIS — I340 Nonrheumatic mitral (valve) insufficiency: Secondary | ICD-10-CM | POA: Diagnosis not present

## 2023-06-10 DIAGNOSIS — H43813 Vitreous degeneration, bilateral: Secondary | ICD-10-CM

## 2023-06-10 DIAGNOSIS — H353221 Exudative age-related macular degeneration, left eye, with active choroidal neovascularization: Secondary | ICD-10-CM | POA: Diagnosis not present

## 2023-06-10 DIAGNOSIS — H353112 Nonexudative age-related macular degeneration, right eye, intermediate dry stage: Secondary | ICD-10-CM | POA: Diagnosis not present

## 2023-06-11 NOTE — Telephone Encounter (Signed)
 Chart reviewed - Patient had worsening exertional chest pressure and dyspnea with exertion 01/2023 - no change in BP  - PCP contacted to r/o PE - discomfort was persistent (planned for PET MPI) - PET MPI was performed-> normal perfusion; Dr. Micael Adas reached out to patient in conjunction with Demetris R Staley, LPN called back results; an echocardiogram was ordered - Echo shows mild Aortic regurgitation with mild to moderate MR; reported back 05/24/23 - no evidence of cardiac chest pain - in interval, also had a CT Aorta No PE, stable aorta  Please see the MyChart message reply(ies) for my assessment and plan.    This patient gave consent for this Medical Advice Message and is aware that it may result in a bill to [employer], as well as the possibility of receiving a bill for a co-payment or deductible. They are an established patient, but are not seeking medical advice exclusively about a problem treated during an in person or video visit in the last seven days. I did not recommend an in person or video visit within seven days of my reply.    I spent a total of 10 minutes cumulative time within 7 days through [employer].  Jann Melody, MD

## 2023-06-17 ENCOUNTER — Telehealth: Payer: Self-pay | Admitting: Neurology

## 2023-06-17 ENCOUNTER — Other Ambulatory Visit: Payer: Self-pay | Admitting: Neurology

## 2023-06-17 NOTE — Telephone Encounter (Signed)
 Pt notifying will be changing pharmacy to [facility] [address] [place], [place] [address] [phone]

## 2023-06-17 NOTE — Telephone Encounter (Signed)
 Noted, added pharmacy to chart and mychart message sent .

## 2023-06-26 ENCOUNTER — Telehealth: Payer: Self-pay | Admitting: Neurology

## 2023-06-26 NOTE — Telephone Encounter (Signed)
 LVM and sent mychart msg informing pt of need to reschedule 08/06/23 appt - NP out

## 2023-07-01 NOTE — Telephone Encounter (Signed)
 Reschedule appointment on 08/13/23 at 10:45 am

## 2023-07-15 ENCOUNTER — Ambulatory Visit: Payer: Self-pay

## 2023-07-15 DIAGNOSIS — Z0000 Encounter for general adult medical examination without abnormal findings: Secondary | ICD-10-CM | POA: Diagnosis not present

## 2023-07-15 DIAGNOSIS — I2510 Atherosclerotic heart disease of native coronary artery without angina pectoris: Secondary | ICD-10-CM | POA: Diagnosis not present

## 2023-07-15 DIAGNOSIS — E782 Mixed hyperlipidemia: Secondary | ICD-10-CM | POA: Diagnosis not present

## 2023-07-15 DIAGNOSIS — Z23 Encounter for immunization: Secondary | ICD-10-CM | POA: Diagnosis not present

## 2023-07-15 DIAGNOSIS — I77810 Thoracic aortic ectasia: Secondary | ICD-10-CM

## 2023-07-15 DIAGNOSIS — M129 Arthropathy, unspecified: Secondary | ICD-10-CM | POA: Diagnosis not present

## 2023-07-15 DIAGNOSIS — N3281 Overactive bladder: Secondary | ICD-10-CM | POA: Diagnosis not present

## 2023-07-15 DIAGNOSIS — N400 Enlarged prostate without lower urinary tract symptoms: Secondary | ICD-10-CM | POA: Diagnosis not present

## 2023-07-15 DIAGNOSIS — K219 Gastro-esophageal reflux disease without esophagitis: Secondary | ICD-10-CM | POA: Diagnosis not present

## 2023-07-15 DIAGNOSIS — Z1331 Encounter for screening for depression: Secondary | ICD-10-CM | POA: Diagnosis not present

## 2023-07-15 DIAGNOSIS — I10 Essential (primary) hypertension: Secondary | ICD-10-CM | POA: Diagnosis not present

## 2023-07-15 DIAGNOSIS — I340 Nonrheumatic mitral (valve) insufficiency: Secondary | ICD-10-CM

## 2023-07-15 DIAGNOSIS — I4819 Other persistent atrial fibrillation: Secondary | ICD-10-CM | POA: Diagnosis not present

## 2023-07-25 ENCOUNTER — Telehealth: Payer: Self-pay | Admitting: Internal Medicine

## 2023-07-25 DIAGNOSIS — I482 Chronic atrial fibrillation, unspecified: Secondary | ICD-10-CM

## 2023-07-25 MED ORDER — APIXABAN 5 MG PO TABS: 5.0000 mg | ORAL_TABLET | Freq: Two times a day (BID) | ORAL | 0 refills | Status: DC

## 2023-07-25 NOTE — Telephone Encounter (Signed)
*  STAT* If patient is at the pharmacy, call can be transferred to refill team.   1. Which medications need to be refilled? (please list name of each medication and dose if known)   apixaban  (ELIQUIS ) 5 MG TABS tablet   2. Would you like to learn more about the convenience, safety, & potential cost savings by using the [facility]?   3. Are you open to using the [facility] (Type [facility]. ).  4. Which pharmacy/location (including street and city if local pharmacy) is medication to be sent to?  [facility] [place], [place] - [address]   5. Do they need a 30 day or 90 day supply?   90 day  Patient stated still has some medication. Patient has appointment with Dr. Paulita Boss on 8/1.

## 2023-07-25 NOTE — Telephone Encounter (Signed)
 Prescription refill request for Eliquis  received. Indication: a fib Last office visit: 10/12/22 Scr: 0.73 epic 10/12/22 Age: 78 Weight: 90kg

## 2023-08-06 ENCOUNTER — Ambulatory Visit: Payer: Medicare PPO | Admitting: Neurology

## 2023-08-08 ENCOUNTER — Other Ambulatory Visit: Payer: Self-pay | Admitting: Neurology

## 2023-08-09 DIAGNOSIS — R350 Frequency of micturition: Secondary | ICD-10-CM | POA: Diagnosis not present

## 2023-08-09 DIAGNOSIS — N281 Cyst of kidney, acquired: Secondary | ICD-10-CM | POA: Diagnosis not present

## 2023-08-09 DIAGNOSIS — N401 Enlarged prostate with lower urinary tract symptoms: Secondary | ICD-10-CM | POA: Diagnosis not present
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
## 2023-08-13 ENCOUNTER — Ambulatory Visit: Admitting: Neurology

## 2023-08-13 ENCOUNTER — Encounter: Payer: Self-pay | Admitting: Neurology

## 2023-08-13 VITALS — BP 139/84 | HR 89 | Ht 67.0 in | Wt 196.0 lb

## 2023-08-13 DIAGNOSIS — R5383 Other fatigue: Secondary | ICD-10-CM

## 2023-08-13 DIAGNOSIS — G7000 Myasthenia gravis without (acute) exacerbation: Secondary | ICD-10-CM | POA: Diagnosis not present

## 2023-08-13 MED ORDER — PYRIDOSTIGMINE BROMIDE 60 MG PO TABS: 60.0000 mg | ORAL_TABLET | Freq: Three times a day (TID) | ORAL | 1 refills | Status: DC

## 2023-08-13 MED ORDER — MYCOPHENOLATE MOFETIL 250 MG PO CAPS: 250.0000 mg | ORAL_CAPSULE | Freq: Two times a day (BID) | ORAL | 1 refills | Status: DC

## 2023-08-13 NOTE — Patient Instructions (Signed)
 Continue with current dose of medications.  Check labs today.  Follow-up with cardiology.  Call for any worsening of myasthenia symptoms.  Follow-up in 6 months with Dr. Onita.  Thanks!!

## 2023-08-14 ENCOUNTER — Ambulatory Visit: Payer: Self-pay | Admitting: Neurology

## 2023-08-14 LAB — CBC WITH DIFFERENTIAL/PLATELET
Basophils Absolute: 0.1 10*3/uL (ref 0.0–0.2)
Basos: 1 %
EOS (ABSOLUTE): 0.2 10*3/uL (ref 0.0–0.4)
Eos: 2 %
Hematocrit: 46.2 % (ref 37.5–51.0)
Hemoglobin: 15.3 g/dL (ref 13.0–17.7)
Immature Grans (Abs): 0 10*3/uL (ref 0.0–0.1)
Immature Granulocytes: 0 %
Lymphocytes Absolute: 0.9 10*3/uL (ref 0.7–3.1)
Lymphs: 12 %
MCH: 32.1 pg (ref 26.6–33.0)
MCHC: 33.1 g/dL (ref 31.5–35.7)
MCV: 97 fL (ref 79–97)
Monocytes Absolute: 0.5 10*3/uL (ref 0.1–0.9)
Monocytes: 8 %
Neutrophils Absolute: 5.5 10*3/uL (ref 1.4–7.0)
Neutrophils: 77 %
Platelets: 152 10*3/uL (ref 150–450)
RBC: 4.76 x10E6/uL (ref 4.14–5.80)
RDW: 12.4 % (ref 11.6–15.4)
WBC: 7.2 10*3/uL (ref 3.4–10.8)

## 2023-08-14 LAB — VITAMIN D 25 HYDROXY (VIT D DEFICIENCY, FRACTURES): Vit D, 25-Hydroxy: 51.8 ng/mL (ref 30.0–100.0)

## 2023-08-14 LAB — VITAMIN B12: Vitamin B-12: 765 pg/mL (ref 232–1245)

## 2023-08-21 DIAGNOSIS — L728 Other follicular cysts of the skin and subcutaneous tissue: Secondary | ICD-10-CM | POA: Diagnosis not present

## 2023-08-21 DIAGNOSIS — L720 Epidermal cyst: Secondary | ICD-10-CM | POA: Diagnosis not present

## 2023-08-21 DIAGNOSIS — L821 Other seborrheic keratosis: Secondary | ICD-10-CM | POA: Diagnosis not present

## 2023-08-21 DIAGNOSIS — L723 Sebaceous cyst: Secondary | ICD-10-CM | POA: Diagnosis not present

## 2023-08-22 ENCOUNTER — Encounter (INDEPENDENT_AMBULATORY_CARE_PROVIDER_SITE_OTHER): Payer: Self-pay | Admitting: Internal Medicine

## 2023-08-22 DIAGNOSIS — R5383 Other fatigue: Secondary | ICD-10-CM | POA: Diagnosis not present

## 2023-08-25 NOTE — Telephone Encounter (Signed)
 Patient called in with labile blood pressures, and [employer] arrhythmias - Kardia [employer] log reviewed; one lwo BP three elevated blood pressures, one stage 1 HTN blood pressure, predominantly normal blood pressures. - patient has a history of permanent atrial fibrillation (hx of three failed DCCV's longest time in SR in 2018 was three weeks) - Last April had CP and DOE, Stress test negative, Echo shows no severe valve disease, no evidence of PE; there was no evidence of cardiac chest pain in his extensive work up this spring - Patient has fatigue and bradycardia, digoxin  is often used in rate control for patient with myasthenia gravis, given low heart rates, lets stop this medication and see if it improved his symptoms; his fatigue may not be cardiac in nature.

## 2023-08-25 NOTE — Telephone Encounter (Signed)
 Please see the MyChart message reply(ies) for my assessment and plan.    This patient gave consent for this Medical Advice Message and is aware that it may result in a bill to [employer], as well as the possibility of receiving a bill for a co-payment or deductible. They are an established patient, but are not seeking medical advice exclusively about a problem treated during an in person or video visit in the last seven days. I did not recommend an in person or video visit within seven days of my reply.    I spent a total of 12 minutes cumulative time within 7 days through [employer].  Stanly DELENA Leavens, MD

## 2023-08-26 ENCOUNTER — Encounter (INDEPENDENT_AMBULATORY_CARE_PROVIDER_SITE_OTHER): Admitting: Ophthalmology

## 2023-08-26 DIAGNOSIS — H353112 Nonexudative age-related macular degeneration, right eye, intermediate dry stage: Secondary | ICD-10-CM

## 2023-08-26 DIAGNOSIS — H35033 Hypertensive retinopathy, bilateral: Secondary | ICD-10-CM

## 2023-08-26 DIAGNOSIS — I10 Essential (primary) hypertension: Secondary | ICD-10-CM

## 2023-08-26 DIAGNOSIS — H353221 Exudative age-related macular degeneration, left eye, with active choroidal neovascularization: Secondary | ICD-10-CM

## 2023-08-26 DIAGNOSIS — H43813 Vitreous degeneration, bilateral: Secondary | ICD-10-CM | POA: Diagnosis not present

## 2023-09-03 ENCOUNTER — Other Ambulatory Visit: Payer: Self-pay

## 2023-09-03 MED ORDER — DIGOXIN 250 MCG PO TABS: ORAL_TABLET | ORAL | 0 refills | Status: DC

## 2023-09-04 DIAGNOSIS — H353110 Nonexudative age-related macular degeneration, right eye, stage unspecified: Secondary | ICD-10-CM | POA: Diagnosis not present

## 2023-09-04 DIAGNOSIS — Z961 Presence of intraocular lens: Secondary | ICD-10-CM | POA: Diagnosis not present

## 2023-09-04 DIAGNOSIS — H353220 Exudative age-related macular degeneration, left eye, stage unspecified: Secondary | ICD-10-CM | POA: Diagnosis not present

## 2023-09-13 ENCOUNTER — Ambulatory Visit: Attending: Internal Medicine | Admitting: Internal Medicine

## 2023-09-13 VITALS — BP 122/76 | HR 77 | Ht 67.0 in | Wt 193.2 lb

## 2023-09-13 DIAGNOSIS — I77810 Thoracic aortic ectasia: Secondary | ICD-10-CM | POA: Diagnosis not present

## 2023-09-13 DIAGNOSIS — I10 Essential (primary) hypertension: Secondary | ICD-10-CM

## 2023-09-13 DIAGNOSIS — R931 Abnormal findings on diagnostic imaging of heart and coronary circulation: Secondary | ICD-10-CM

## 2023-09-13 DIAGNOSIS — I482 Chronic atrial fibrillation, unspecified: Secondary | ICD-10-CM | POA: Diagnosis not present

## 2023-09-13 DIAGNOSIS — I340 Nonrheumatic mitral (valve) insufficiency: Secondary | ICD-10-CM | POA: Diagnosis not present

## 2023-09-13 NOTE — Patient Instructions (Signed)
 Medication Instructions:  - Stop digoxin   *If you need a refill on your cardiac medications before your next appointment, please call your pharmacy*  Lab Work: - None ordered  If you have labs (blood work) drawn today and your tests are completely normal, you will receive your results only by: MyChart Message (if you have MyChart) OR A paper copy in the mail If you have any lab test that is abnormal or we need to change your treatment, we will call you to review the results.  Testing/Procedures: - Echocardiogram due in April 2026  Your physician has requested that you have an echocardiogram. Echocardiography is a painless test that uses sound waves to create images of your heart. It provides your doctor with information about the size and shape of your heart and how well your heart's chambers and valves are working. This procedure takes approximately one hour. There are no restrictions for this procedure. Please do NOT wear cologne, perfume, aftershave, or lotions (deodorant is allowed). Please arrive 15 minutes prior to your appointment time.  Please note: We ask at that you not bring children with you during ultrasound (echo/ vascular) testing. Due to room size and safety concerns, children are not allowed in the ultrasound rooms during exams. Our front office staff cannot provide observation of children in our lobby area while testing is being conducted. An adult accompanying a patient to their appointment will only be allowed in the ultrasound room at the discretion of the ultrasound technician under special circumstances. We apologize for any inconvenience.   Follow-Up: At [facility], you and your health needs are our priority.  As part of our continuing mission to provide you with exceptional heart care, our providers are all part of one team.  This team includes your primary Cardiologist (physician) and Advanced Practice Providers or APPs (Physician Assistants and Nurse  Practitioners) who all work together to provide you with the care you need, when you need it.  Your next appointment:   8-9 month(s) (after echocardiogram)  Provider:   Stanly DELENA Leavens, MD or APP

## 2023-09-13 NOTE — Progress Notes (Signed)
 Cardiology Office Note:    Date:  09/13/2023   ID:  David DELENA Jannelle Mickey., DOB 30-Sep-1945, MRN [id number]  PCP:  Claudene Pellet, MD  Cardiologist:  Stanly DELENA Leavens, MD   Referring MD: Claudene Pellet, MD   CC: Follow up fatigue  History of Present Illness:    [name]. is a 78 y.o. male with a hx of permanent AF, chronic anticoagulation, ascending aortic aneurysm 4.8 cm June 2022 CT essential hypertension, hyperlipidemia, myasthenia gravis and nonobstructive CAD by cath 2002 (LAD luminal irregularities). He is a retired Building surveyor, and Nurse, mental health.  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   2016- MG In remission 2023: BP labile. 2024: Transitioned to see me  David A David Jr. [name] is a 78 year old male with permanent atrial fibrillation and myasthenia gravis who presents with fatigue and chest discomfort.  Wife, [name], Is joining us  virtually today.  He experiences increased fatigue and weakness, particularly in hot and humid conditions, which he attributes to his myasthenia gravis. His fatigue has been stable, but he has had to adjust to it over time. Previously, he walked two miles and then an additional half to three-quarters of a mile with his dogs, but now finds it challenging to reach five miles a day. Adjustments to his myasthenia medication have helped with his symptoms.  He experienced chest discomfort about a month ago, uncertain if it was related to muscle fatigue or another issue. Around the same time, his Crist app showed unusual readings, including one instance of sinus rhythm, which was unusual for him. Since adjusting his medication, he has not experienced the same chest discomfort.  He has a history of permanent atrial fibrillation and has been on digoxin . He has not experienced rapid ventricular response recently and uses the [place] app to monitor his heart rate. He recalls an incident where he had a heart rate of  170 bpm without feeling any symptoms, leading to an ambulance ride to the hospital.  His medical history includes aortic aneurysm, hypertension, hyperlipidemia, and nonobstructive coronary artery disease. He recalls that previous testing, including a PET scan and echocardiogram, were performed, and he has been told he has mild aortic root dilation and mild to moderate mitral regurgitation.  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 No recent chest discomfort or significant changes in breathing.  Past Medical History:  Diagnosis Date   A-fib [facility])    Ascending aorta dilatation (HCC)    40 mm ascending aorta and 42 mm aortic root by echo 05/2023   Dysrhythmia    GERD (gastroesophageal reflux disease)    History of Bell's palsy    History of pericarditis    Hypertension    Mitral regurgitation    Mild to moderate by echo 05/2023   Mixed hyperlipidemia    Myasthenia gravis (HCC)    currently in remission (11/2014)     Past Surgical History:  Procedure Laterality Date   CARDIOVERSION N/A 09/21/2016   Procedure: CARDIOVERSION;  Surgeon: Francyne Headland, MD;  Location: [facility] ENDOSCOPY;  Service: Cardiovascular;  Laterality: N/A;   CARDIOVERSION N/A 10/17/2016   Procedure: CARDIOVERSION;  Surgeon: Rolan Ezra RAMAN, MD;  Location: [facility] ENDOSCOPY;  Service: Cardiovascular;  Laterality: N/A;   CARDIOVERSION N/A 10/31/2016   Procedure: CARDIOVERSION;  Surgeon: Delford Maude BROCKS, MD;  Location: [facility] ENDOSCOPY;  Service: Cardiovascular;  Laterality: N/A;   LAMINECTOMY  1991   LAPAROSCOPIC APPENDECTOMY N/A 09/10/2019  Procedure: APPENDECTOMY LAPAROSCOPIC;  Surgeon: Gladis Cough, MD;  Location: [facility] ORS;  Service: General;  Laterality: N/A;   TEE WITHOUT CARDIOVERSION N/A 09/21/2016   Procedure: TRANSESOPHAGEAL ECHOCARDIOGRAM (TEE);  Surgeon: Francyne Headland, MD;  Location: [facility] ENDOSCOPY;  Service: Cardiovascular;  Laterality: N/A;   TEE WITHOUT CARDIOVERSION N/A 10/31/2016    Procedure: TRANSESOPHAGEAL ECHOCARDIOGRAM (TEE);  Surgeon: Delford Maude BROCKS, MD;  Location: [facility] ENDOSCOPY;  Service: Cardiovascular;  Laterality: N/A;   TONSILLECTOMY  1950    Current Medications: Current Meds  Medication Sig   Alpha-Lipoic Acid 300 MG CAPS Take 300 mg by mouth daily.    apixaban  (ELIQUIS ) 5 MG TABS tablet Take 1 tablet (5 mg total) by mouth 2 (two) times daily.   ascorbic acid (VITAMIN C) 1000 MG tablet daily.   atorvastatin  (LIPITOR) 20 MG tablet TAKE 1 TABLET BY MOUTH DAILY   cholecalciferol (VITAMIN D3) 25 MCG (1000 UNIT) tablet Take 1,000 Units by mouth daily.   Coenzyme Q10 (COQ-10) 100 MG CAPS Take 100 mg by mouth daily.    GEMTESA 75 MG TABS Take by mouth.   Multiple Vitamins-Minerals (CENTRUM SILVER PO) Take 1 tablet by mouth daily.   mycophenolate  (CELLCEPT ) 250 MG capsule Take 1 capsule (250 mg total) by mouth 2 (two) times daily.   Omega-3 Fatty Acids (OMEGA-3 PO) Take 1,040 mg by mouth daily.   pyridostigmine  (MESTINON ) 60 MG tablet Take 1 tablet (60 mg total) by mouth 3 (three) times daily.   ramipril  (ALTACE ) 5 MG capsule Take 5 mg by mouth daily.   Resveratrol 100 MG CAPS Take 100 mg by mouth 2 (two) times daily.    tamsulosin  (FLOMAX ) 0.4 MG CAPS capsule Take 0.4 mg by mouth daily.   triamterene -hydrochlorothiazide  (MAXZIDE -25) 37.5-25 MG tablet Take 1 tablet by mouth daily.   TURMERIC PO Take 100 mg by mouth 2 (two) times daily.   [DISCONTINUED] digoxin  (LANOXIN ) 0.25 MG tablet TAKE 1 TABLET(0.25 MG) BY MOUTH DAILY     Allergies:   Demerol [meperidine], Aminoglycosides, Beta adrenergic blockers, Calcium  channel blockers, Ciprofloxacin, Penicillamine, Procainamide hcl, Quinine derivatives, Succinylcholine chloride, and Vecuronium bromide [vecuronium]   Social History   Socioeconomic History   Marital status: Married    Spouse name: Not on file   Number of children: 1   Years of education: Grad Sch   Highest education level: Not on file  Occupational  History   Occupation: Pharmacologist  Tobacco Use   Smoking status: Former    Types: Pipe    Quit date: 12/10/1966    Years since quitting: 56.7   Smokeless tobacco: Never  Vaping Use   Vaping status: Never Used  Substance and Sexual Activity   Alcohol use: Not Currently    Alcohol/week: 1.0 standard drink of alcohol    Types: 1 Glasses of wine per week   Drug use: No   Sexual activity: Yes  Other Topics Concern   Not on file  Social History Narrative   Lives at home with his wife.   Right-handed.   Occasional caffeine use.   Social Drivers of Corporate investment banker Strain: Not on file  Food Insecurity: Not on file  Transportation Needs: Not on file  Physical Activity: Not on file  Stress: Not on file  Social Connections: Not on file    Family History: The patient's family history includes Cancer in his brother and mother; Healthy in his daughter; Heart disease in his father.  ROS:   Please see the history  of present illness.     EKGs/Labs/Other Studies Reviewed:    The following studies were reviewed today:  Cardiac Studies & Procedures   ______________________________________________________________________________________________   STRESS TESTS  NM PET CT CARDIAC PERFUSION MULTI W/ABSOLUTE BLOODFLOW 04/23/2023  Narrative   LV perfusion is normal. There is no evidence of ischemia. There is no evidence of infarction.   Rest left ventricular function is normal. Stress left ventricular function is normal. End diastolic cavity size is normal. End systolic cavity size is normal.   Myocardial blood flow was computed to be 0.8ml/g/min at rest and 1.41ml/g/min at stress. Global myocardial blood flow reserve was 3.02 and was normal.   Coronary calcium  was present on the attenuation correction CT images. Severe coronary calcifications were present. Coronary calcifications were present in the left anterior descending artery and left circumflex artery  distribution(s).   Normal perfusion, stress EF and MBFR. TID present and servere calcium  noted in LAD/LCX  CLINICAL DATA:  This over-read does not include interpretation of cardiac or coronary anatomy or pathology. The Cardiac PET CT interpretation by the cardiologist is attached.  COMPARISON:  CT chest angiogram, 04/16/2022  FINDINGS: Cardiovascular: Aortic atherosclerosis. Unchanged enlargement of the tubular ascending thoracic aorta measuring up to 4.4 cm in caliber. Cardiomegaly. Extensive three-vessel coronary artery calcifications and or stents. Enlargement of the main pulmonary artery measuring up to 3.9 cm in caliber. No pericardial effusion.  Limited Mediastinum/Nodes: No enlarged mediastinal, hilar, or axillary lymph nodes. Trachea and esophagus demonstrate no significant findings.  Limited Lungs/Pleura: Bilateral bronchial wall thickening. No pleural effusion or pneumothorax.  Upper Abdomen: No acute abnormality.  Musculoskeletal: No chest wall abnormality. No acute osseous findings.  IMPRESSION: 1. Bilateral bronchial wall thickening consistent with nonspecific infectious or inflammatory bronchitis 2. Unchanged enlargement of the tubular ascending thoracic aorta measuring up to 4.4 cm in caliber. Recommend annual imaging follow-up by CTA or MRA if not otherwise imaged and if clinically appropriate. 3. Cardiomegaly and coronary artery disease. 4. Enlargement of the main pulmonary artery, as can be seen in pulmonary hypertension.  Aortic Atherosclerosis (ICD10-I70.0).   Electronically Signed By: Marolyn JONETTA Jaksch M.D. On: 04/23/2023 10:58   ECHOCARDIOGRAM  ECHOCARDIOGRAM COMPLETE 05/23/2023  Narrative ECHOCARDIOGRAM REPORT    Patient Name:   David A Battie Jr. Date of Exam: 05/23/2023 Medical Rec #:  [id number]               Height:       67.0 in Accession #:    [id number]              Weight:       199.0 lb Date of Birth:  09-18-1945               BSA:          2.018 m Patient Age:    51 years                BP:           128/74 mmHg Patient Gender: M                       HR:           77 bpm. Exam Location:  [address]  Procedure: 2D Echo, Cardiac Doppler and Color Doppler (Both Spectral and Color Flow Doppler were utilized during procedure).  Indications:    I51.7 Cardiomegaly Aneurysm of ascending aorta without rupture I71.21  History:  Patient has prior history of Echocardiogram examinations, most recent 03/17/2018. Arrythmias:Atrial Fibrillation; Risk Factors:Hypertension and Dyslipidemia.  Sonographer:    Carl Rodgers-Jones RDCS Referring Phys: [id number] TRACI R TURNER  IMPRESSIONS   1. Left ventricular ejection fraction, by estimation, is 60 to 65%. The left ventricle has normal function. The left ventricle has no regional wall motion abnormalities. Left ventricular diastolic parameters are indeterminate. 2. Right ventricular systolic function is normal. The right ventricular size is mildly enlarged. There is normal pulmonary artery systolic pressure. The estimated right ventricular systolic pressure is 27.9 mmHg. 3. Left atrial size was severely dilated. 4. Right atrial size was moderately dilated. 5. The mitral valve was not well visualized. Mild to moderate mitral valve regurgitation. 6. The aortic valve was not well visualized. Aortic valve regurgitation is not visualized. 7. Aneurysm of the aortic root, measuring 42 mm. Aneurysm of the ascending aorta, measuring 40 mm. 8. The inferior vena cava is dilated in size with >50% respiratory variability, suggesting right atrial pressure of 8 mmHg.  FINDINGS Left Ventricle: Left ventricular ejection fraction, by estimation, is 60 to 65%. The left ventricle has normal function. The left ventricle has no regional wall motion abnormalities. The left ventricular internal cavity size was normal in size. There is no left ventricular hypertrophy. Left ventricular diastolic  parameters are indeterminate.  Right Ventricle: The right ventricular size is mildly enlarged. Right ventricular systolic function is normal. There is normal pulmonary artery systolic pressure. The tricuspid regurgitant velocity is 2.23 m/s, and with an assumed right atrial pressure of 8 mmHg, the estimated right ventricular systolic pressure is 27.9 mmHg.  Left Atrium: Left atrial size was severely dilated.  Right Atrium: Right atrial size was moderately dilated.  Pericardium: There is no evidence of pericardial effusion.  Mitral Valve: The mitral valve was not well visualized. Mild to moderate mitral valve regurgitation.  Tricuspid Valve: Tricuspid valve regurgitation is mild.  Aortic Valve: The aortic valve was not well visualized. Aortic valve regurgitation is not visualized.  Pulmonic Valve: Pulmonic valve regurgitation is not visualized.  Aorta: There is an aneurysm involving the aortic root measuring 42 mm. There is an aneurysm involving the ascending aorta measuring 40 mm.  Venous: The inferior vena cava is dilated in size with greater than 50% respiratory variability, suggesting right atrial pressure of 8 mmHg.  IAS/Shunts: No atrial level shunt detected by color flow Doppler.  Additional Comments: 3D was performed not requiring image post processing on an independent workstation and was normal.   LEFT VENTRICLE PLAX 2D LVIDd:         5.40 cm LVIDs:         3.00 cm LV PW:         0.90 cm LV IVS:        0.90 cm LVOT diam:     2.20 cm LV SV:         63 LV SV Index:   31 LVOT Area:     3.80 cm   RIGHT VENTRICLE             IVC RV Basal diam:  4.40 cm     IVC diam: 2.10 cm RV S prime:     15.00 cm/s TAPSE (M-mode): 3.2 cm  LEFT ATRIUM             Index        RIGHT ATRIUM           Index LA diam:  4.80 cm 2.38 cm/m   RA Area:     22.90 cm LA Vol (A2C):   93.8 ml 46.48 ml/m  RA Volume:   79.40 ml  39.35 ml/m LA Vol (A4C):   97.7 ml 48.42 ml/m LA  Biplane Vol: 96.7 ml 47.92 ml/m AORTIC VALVE LVOT Vmax:   89.10 cm/s LVOT Vmean:  54.500 cm/s LVOT VTI:    0.164 m  AORTA Ao Root diam: 4.20 cm Ao Asc diam:  4.00 cm  MV E velocity: 109.67 cm/s  TRICUSPID VALVE TR Peak grad:   19.9 mmHg TR Vmax:        223.00 cm/s  SHUNTS Systemic VTI:  0.16 m Systemic Diam: 2.20 cm  Ronal Ross Electronically signed by Ronal Ross Signature Date/Time: 05/23/2023/11:49:09 AM    Final   TEE  ECHO TEE 09/21/2016  Narrative *[place]* *[facility]* [address] [place], [place] [address] [phone]  ------------------------------------------------------------------- Transesophageal Echocardiography with Cardioversion  Patient:    Lexus, Barletta MR #:       [id number] Study Date: 09/21/2016 Gender:     M Age:        70 Height:     170.2 cm Weight:     98.2 kg BSA:        2.19 m^2 Pt. Status: Room:       3W32C  ADMITTING    Jerel Balding, MD ATTENDING    Jerel Balding, MD PERFORMING   Jerel Balding, MD TISA Pines, Tiffany BARTON Pines, Tiffany SONOGRAPHER  Ellouise Mose  cc:  ------------------------------------------------------------------- LV EF: 50% -   55%  ------------------------------------------------------------------- History:   PMH:  History of Pericarditis.  Atrial fibrillation. Risk factors:  Hypertension. Dyslipidemia.  ------------------------------------------------------------------- Study Conclusions  - Left ventricle: The cavity size was normal. Wall thickness was normal. Systolic function was at the lower limits of normal. The estimated ejection fraction was in the range of 50% to 55%. Wall motion was normal; there were no regional wall motion abnormalities. No evidence of thrombus. - Aortic valve: No evidence of vegetation. No evidence of vegetation. There was trivial regurgitation. - Mitral valve: No evidence of vegetation. There was mild  to moderate regurgitation directed centrally. - Left atrium: The atrium was severely dilated. No evidence of thrombus in the atrial cavity or appendage. No spontaneous echo contrast was observed. - Right atrium: The atrium was dilated. - Atrial septum: No defect or patent foramen ovale was identified. - Tricuspid valve: No evidence of vegetation. No evidence of vegetation. - Pulmonic valve: No evidence of vegetation.  Impressions:  - Successful cardioversion.  ------------------------------------------------------------------- Study data:   Study status:  Routine.  Consent:  The risks, benefits, and alternatives to the procedure were explained to the patient and informed consent was obtained.  Procedure:  The patient reported no pain pre or post test. Initial setup. The patient was brought to the laboratory in the fasting state. A baseline ECG was recorded. Intravenous access was obtained. Surface ECG leads and pulse oximetric signals were monitored. Self-adhesive anterior-posterior defibrillation pads were applied. Sedation. General anesthesia was administered during cardioversion by cardiology staff. Transesophageal echocardiography. Topical anesthesia was obtained using viscous lidocaine . An adult multiplane transesophageal probe was inserted by the attending cardiologistwithout difficulty. Image quality was excellent. Images were captured in a quad screen format to simplify data comparison. No intracardiac thrombus was identified. . The rhythm was successfully converted from atrial fibrillation to normal sinus rhythm.  Study completion:  All IVs inserted during the procedure were removed. The patient tolerated the procedure well. There were no complications.          Transesophageal echocardiography with cardioversion.  2D and intravenous contrast injection.  Birthdate: Patient birthdate: 1945-02-16.  Age:  Patient is 78 yr old.  Sex: Gender: male.    BMI: 33.9 kg/m^2.  Blood  pressure:     107/83 Patient status:  Inpatient.  Study date:  Study date: 09/21/2016. Study time: 12:07 PM.  Location:  Endoscopy.  -------------------------------------------------------------------  ------------------------------------------------------------------- Left ventricle:  The cavity size was normal. Wall thickness was normal. Systolic function was at the lower limits of normal. The estimated ejection fraction was in the range of 50% to 55%. Wall motion was normal; there were no regional wall motion abnormalities.  No evidence of thrombus.  ------------------------------------------------------------------- Aortic valve:   Structurally normal valve.   Cusp separation was normal.  No evidence of vegetation.  No evidence of vegetation. Doppler:  There was trivial regurgitation.  ------------------------------------------------------------------- Aorta:  The aorta was normal, not dilated, and non-diseased.  ------------------------------------------------------------------- Mitral valve:   Structurally normal valve.   Leaflet separation was normal.  No evidence of vegetation.  Doppler:  There was mild to moderate regurgitation directed centrally.  ------------------------------------------------------------------- Left atrium:  The atrium was severely dilated.  No evidence of thrombus in the atrial cavity or appendage. No spontaneous echo contrast was observed.  Emptying velocity was normal.  ------------------------------------------------------------------- Atrial septum:  No defect or patent foramen ovale was identified.  ------------------------------------------------------------------- Right ventricle:  The cavity size was normal. Wall thickness was normal. Systolic function was normal.  ------------------------------------------------------------------- Pulmonic valve:    Structurally normal valve.   Cusp separation was normal.  No evidence of vegetation.   Doppler:  There was mild regurgitation.  ------------------------------------------------------------------- Tricuspid valve:   Structurally normal valve.   Leaflet separation was normal.  No evidence of vegetation.  No evidence of vegetation. Doppler:  There was trivial regurgitation.  ------------------------------------------------------------------- Pulmonary artery:   Systolic pressure was within the normal range.  ------------------------------------------------------------------- Right atrium:  The atrium was dilated.  ------------------------------------------------------------------- Pericardium:  There was no pericardial effusion.  ------------------------------------------------------------------- Post procedure conclusions Ascending Aorta:  - The aorta was normal, not dilated, and non-diseased.  ------------------------------------------------------------------- Measurements  Pulmonary arteries                           Value       Reference PA pressure, S, DP                           21    mm Hg <=30  Tricuspid valve                              Value       Reference Tricuspid regurg peak velocity               210.6 cm/s  --------- Tricuspid peak RV-RA gradient                18    mm Hg --------- Tricuspid maximal regurg velocity,           210.6 cm/s  --------- PISA  Systemic veins  Value       Reference Estimated CVP                                3     mm Hg ---------  Right ventricle                              Value       Reference RV pressure, S, DP                           21    mm Hg <=30  Legend: (L)  and  (H)  mark values outside specified reference range.  ------------------------------------------------------------------- Prepared and Electronically Authenticated by  Jerel Balding, MD 2018-08-10T19:46:43         ______________________________________________________________________________________________       Recent Labs: 10/12/2022: BUN 16; Creatinine, Ser 0.73; Potassium 3.8; Sodium 139 08/13/2023: Hemoglobin 15.3; Platelets 152  Recent Lipid Panel    Component Value Date/Time   CHOL 144 09/13/2015 1041   CHOL 218 (H) 12/14/2014 0915   TRIG 106 09/14/2019 0350   TRIG 77 12/14/2014 0915   HDL 56 09/13/2015 1041   HDL 52 12/14/2014 0915   CHOLHDL 2.6 09/13/2015 1041   VLDL 12 09/13/2015 1041   LDLCALC 76 09/13/2015 1041   LDLCALC 151 (H) 12/14/2014 0915    Physical Exam:    VS:  BP 122/76 (BP Location: Left Arm, Patient Position: Sitting)   Pulse 77   Ht 5' 7 (1.702 m)   Wt 193 lb 3.2 oz (87.6 kg)   SpO2 95%   BMI 30.26 kg/m     Wt Readings from Last 3 Encounters:  09/13/23 193 lb 3.2 oz (87.6 kg)  08/13/23 196 lb (88.9 kg)  04/30/23 199 lb (90.3 kg)    Gen: No distress HEENT: Normal CARDIAC: IRIR with systolic murmur RESPIRATORY:  Clear to auscultation without rales, wheezing or rhonchi  ABDOMEN: Soft, non-tender, non-distended MUSCULOSKELETAL: No deformity  SKIN: Warm and dry NEUROLOGIC:  Alert and oriented x 3 PSYCHIATRIC:  Normal affect   ASSESSMENT/PLAN:    Permanent atrial fibrillation with chronic anticoagulation - Long-standing persistent atrial fibrillation with no recent episodes of rapid ventricular response. Digoxin  use questioned due to potential interaction with myasthenia gravis and lack of mortality benefit. Discussed potential outcomes of discontinuing digoxin : higher heart rates, feeling better, or no change. Emphasized importance of anticoagulation for stroke prevention. - Discontinue digoxin  in August 2025 - Monitor heart rate using Crist app - if worsening issues he will reach out in September and we can restart his digoxin  - Restart digoxin  if heart rate becomes significantly elevated  Mitral regurgitation (mild to moderate) Mild to  moderate mitral regurgitation likely related to long-term atrial fibrillation causing atrial dilation. - Order echocardiogram in April 2026 to reassess mitral regurgitation  Aortic root and ascending aortic dilation (mild) - Mild aortic root and ascending aortic dilation noted on echocardiogram.  Nonobstructive coronary artery disease - No evidence of ischemia or infarction on recent PET imaging. Normal myocardial blood reserve. No significant coronary blockages detected. - LDL at goal, no change to current therapy  Hypertension and hyperlipidemia - Blood pressure and cholesterol levels are well controlled.  Fatigue, multifactorial (age and myasthenia gravis) Fatigue attributed to aging and myasthenia gravis with recent medication adjustments for myasthenia gravis. No cardiac  origin for fatigue identified.  Post echo f/u   Longitudinal care: The evaluation and management services provided today reflect the complexity inherent in caring for this patient, including the ongoing longitudinal relationship and management of multiple chronic conditions and/or the need for care coordination. The visit required a comprehensive assessment and management plan tailored to the patient's unique needs Time was spent addressing not only the acute concerns but also the broader context of the patient's health, including preventive care, chronic disease management, and care coordination as appropriate.  Complex longitudinal is necessary for conditions including: arrhythmia management in the setting of myasthenia gravis.   Stanly Leavens, MD FASE [facility] Cardiologist [facility]  [address] [place], [place] [address] [phone]  9:30 AM

## 2023-11-11 ENCOUNTER — Encounter (INDEPENDENT_AMBULATORY_CARE_PROVIDER_SITE_OTHER): Payer: Self-pay | Admitting: Ophthalmology

## 2023-11-11 DIAGNOSIS — H353112 Nonexudative age-related macular degeneration, right eye, intermediate dry stage: Secondary | ICD-10-CM | POA: Diagnosis not present

## 2023-11-11 DIAGNOSIS — I10 Essential (primary) hypertension: Secondary | ICD-10-CM

## 2023-11-11 DIAGNOSIS — H43813 Vitreous degeneration, bilateral: Secondary | ICD-10-CM

## 2023-11-11 DIAGNOSIS — H35033 Hypertensive retinopathy, bilateral: Secondary | ICD-10-CM

## 2023-11-11 DIAGNOSIS — H353221 Exudative age-related macular degeneration, left eye, with active choroidal neovascularization: Secondary | ICD-10-CM | POA: Diagnosis not present

## 2023-11-19 ENCOUNTER — Encounter: Payer: Self-pay | Admitting: Internal Medicine

## 2023-11-20 ENCOUNTER — Other Ambulatory Visit: Payer: Self-pay | Admitting: Internal Medicine

## 2023-11-20 DIAGNOSIS — I482 Chronic atrial fibrillation, unspecified: Secondary | ICD-10-CM

## 2023-11-20 NOTE — Telephone Encounter (Signed)
 Prescription refill request for Eliquis  received. Indication:afib Last office visit:8/25 Scr:0.68  2025 Age: 78 Weight:87.6  kg  Prescription refilled

## 2023-11-26 MED ORDER — DIGOXIN 125 MCG PO TABS: 0.1250 mg | ORAL_TABLET | Freq: Every day | ORAL | 3 refills | Status: AC

## 2023-11-26 MED ORDER — DIGOXIN 125 MCG PO TABS: 0.1250 mg | ORAL_TABLET | Freq: Every day | ORAL | 3 refills | Status: DC

## 2023-11-26 NOTE — Addendum Note (Signed)
 Addended by: RICHIE ADRIEN ORN on: 11/26/2023 04:21 PM   Modules accepted: Orders

## 2023-12-13 DIAGNOSIS — L820 Inflamed seborrheic keratosis: Secondary | ICD-10-CM | POA: Diagnosis not present

## 2024-01-15 DIAGNOSIS — E782 Mixed hyperlipidemia: Secondary | ICD-10-CM | POA: Diagnosis not present

## 2024-01-15 DIAGNOSIS — N3281 Overactive bladder: Secondary | ICD-10-CM | POA: Diagnosis not present

## 2024-01-15 DIAGNOSIS — I10 Essential (primary) hypertension: Secondary | ICD-10-CM | POA: Diagnosis not present

## 2024-01-15 DIAGNOSIS — M129 Arthropathy, unspecified: Secondary | ICD-10-CM | POA: Diagnosis not present

## 2024-01-15 DIAGNOSIS — G7000 Myasthenia gravis without (acute) exacerbation: Secondary | ICD-10-CM | POA: Diagnosis not present

## 2024-01-15 DIAGNOSIS — Z23 Encounter for immunization: Secondary | ICD-10-CM | POA: Diagnosis not present

## 2024-01-15 DIAGNOSIS — D6869 Other thrombophilia: Secondary | ICD-10-CM | POA: Diagnosis not present

## 2024-01-15 DIAGNOSIS — I2510 Atherosclerotic heart disease of native coronary artery without angina pectoris: Secondary | ICD-10-CM | POA: Diagnosis not present

## 2024-01-15 DIAGNOSIS — I4819 Other persistent atrial fibrillation: Secondary | ICD-10-CM | POA: Diagnosis not present

## 2024-01-22 ENCOUNTER — Encounter (INDEPENDENT_AMBULATORY_CARE_PROVIDER_SITE_OTHER): Admitting: Ophthalmology

## 2024-01-22 DIAGNOSIS — I10 Essential (primary) hypertension: Secondary | ICD-10-CM

## 2024-01-22 DIAGNOSIS — H43813 Vitreous degeneration, bilateral: Secondary | ICD-10-CM | POA: Diagnosis not present

## 2024-01-22 DIAGNOSIS — H35033 Hypertensive retinopathy, bilateral: Secondary | ICD-10-CM | POA: Diagnosis not present

## 2024-01-22 DIAGNOSIS — H353221 Exudative age-related macular degeneration, left eye, with active choroidal neovascularization: Secondary | ICD-10-CM | POA: Diagnosis not present

## 2024-01-22 DIAGNOSIS — H353111 Nonexudative age-related macular degeneration, right eye, early dry stage: Secondary | ICD-10-CM | POA: Diagnosis not present

## 2024-02-17 ENCOUNTER — Ambulatory Visit: Admitting: Neurology

## 2024-02-17 ENCOUNTER — Encounter: Payer: Self-pay | Admitting: Neurology

## 2024-02-17 VITALS — BP 117/83 | HR 80 | Ht 67.0 in | Wt 201.2 lb

## 2024-02-17 DIAGNOSIS — R5383 Other fatigue: Secondary | ICD-10-CM

## 2024-02-17 DIAGNOSIS — G7000 Myasthenia gravis without (acute) exacerbation: Secondary | ICD-10-CM | POA: Diagnosis not present

## 2024-02-17 MED ORDER — MYCOPHENOLATE MOFETIL 250 MG PO CAPS: 250.0000 mg | ORAL_CAPSULE | Freq: Two times a day (BID) | ORAL | 4 refills | Status: AC

## 2024-02-17 MED ORDER — PYRIDOSTIGMINE BROMIDE 60 MG PO TABS: 60.0000 mg | ORAL_TABLET | Freq: Three times a day (TID) | ORAL | 3 refills | Status: AC | PRN
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
## 2024-04-08 ENCOUNTER — Encounter (INDEPENDENT_AMBULATORY_CARE_PROVIDER_SITE_OTHER): Admitting: Ophthalmology

## 2024-05-14 ENCOUNTER — Other Ambulatory Visit (HOSPITAL_COMMUNITY)

## 2024-05-18 ENCOUNTER — Ambulatory Visit: Admitting: Internal Medicine

## 2025-02-22 ENCOUNTER — Ambulatory Visit: Admitting: Neurology
